# Patient Record
Sex: Female | Born: 1985
Health system: Southern US, Community
[De-identification: ages and names within clinical notes are randomized; demographics above are authoritative.]

## PROBLEM LIST (undated history)

## (undated) ENCOUNTER — Emergency Department (HOSPITAL_COMMUNITY): Admission: EM | Payer: Medicaid Other

## (undated) DIAGNOSIS — F32A Depression, unspecified: Secondary | ICD-10-CM

## (undated) DIAGNOSIS — F29 Unspecified psychosis not due to a substance or known physiological condition: Secondary | ICD-10-CM

## (undated) DIAGNOSIS — F1011 Alcohol abuse, in remission: Secondary | ICD-10-CM

## (undated) DIAGNOSIS — F419 Anxiety disorder, unspecified: Secondary | ICD-10-CM

## (undated) DIAGNOSIS — F1490 Cocaine use, unspecified, uncomplicated: Secondary | ICD-10-CM

## (undated) DIAGNOSIS — F1911 Other psychoactive substance abuse, in remission: Secondary | ICD-10-CM

## (undated) DIAGNOSIS — F319 Bipolar disorder, unspecified: Secondary | ICD-10-CM

## (undated) DIAGNOSIS — F1020 Alcohol dependence, uncomplicated: Secondary | ICD-10-CM

## (undated) DIAGNOSIS — F329 Major depressive disorder, single episode, unspecified: Secondary | ICD-10-CM

## (undated) DIAGNOSIS — J45909 Unspecified asthma, uncomplicated: Secondary | ICD-10-CM

## (undated) HISTORY — DX: Depression, unspecified: F32.A

## (undated) HISTORY — DX: Unspecified psychosis not due to a substance or known physiological condition: F29

## (undated) HISTORY — DX: Anxiety disorder, unspecified: F41.9

## (undated) HISTORY — DX: Bipolar disorder, unspecified: F31.9

## (undated) HISTORY — DX: Alcohol dependence, uncomplicated: F10.20

## (undated) HISTORY — PX: FRACTURE SURGERY: SHX138

## (undated) HISTORY — DX: Alcohol abuse, in remission: F10.11

## (undated) HISTORY — DX: Cocaine use, unspecified, uncomplicated: F14.90

## (undated) HISTORY — DX: Other psychoactive substance abuse, in remission: F19.11

## (undated) NOTE — *Deleted (*Deleted)
    SUBJECTIVE:   CHIEF COMPLAINT / HPI:  Darlene Gates is a 85 year old female who presents today for a physical  Current concerns include:  Healthcare maintenance COVID vaccine: Flu vaccine: Pap smear: Overdue Hepatitis C screening   PERTINENT  PMH / PSH: Bipolar disorder , depression, anxiety, substance abuse, homelessness  OBJECTIVE:   There were no vitals taken for this visit.   General: Alert,no acute distress, pleasant Cardio: Normal S1 and S2, RRR Pulm: CTAB Normal respiratory effort Abdomen: Bowel sounds normal. Abdomen soft and non-tender.  Extremities: No peripheral edema.  Neuro: Cranial nerves grossly intact  ASSESSMENT/PLAN:   No problem-specific Assessment & Plan notes found for this encounter.   Hepatitis C antibody obtained today.  Towanda Octave, MD [facility]
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
## 2016-07-17 DIAGNOSIS — F330 Major depressive disorder, recurrent, mild: Secondary | ICD-10-CM | POA: Insufficient documentation

## 2016-07-17 DIAGNOSIS — F4310 Post-traumatic stress disorder, unspecified: Secondary | ICD-10-CM | POA: Insufficient documentation

## 2016-12-19 DIAGNOSIS — O0990 Supervision of high risk pregnancy, unspecified, unspecified trimester: Secondary | ICD-10-CM | POA: Insufficient documentation

## 2016-12-19 DIAGNOSIS — O352XX0 Maternal care for (suspected) hereditary disease in fetus, not applicable or unspecified: Secondary | ICD-10-CM | POA: Insufficient documentation

## 2016-12-20 DIAGNOSIS — O3430 Maternal care for cervical incompetence, unspecified trimester: Secondary | ICD-10-CM | POA: Insufficient documentation

## 2017-02-25 DIAGNOSIS — O23592 Infection of other part of genital tract in pregnancy, second trimester: Secondary | ICD-10-CM | POA: Insufficient documentation

## 2017-04-04 DIAGNOSIS — F1910 Other psychoactive substance abuse, uncomplicated: Secondary | ICD-10-CM | POA: Insufficient documentation

## 2017-04-04 DIAGNOSIS — F1990 Other psychoactive substance use, unspecified, uncomplicated: Secondary | ICD-10-CM | POA: Insufficient documentation

## 2017-06-02 DIAGNOSIS — B951 Streptococcus, group B, as the cause of diseases classified elsewhere: Secondary | ICD-10-CM | POA: Insufficient documentation

## 2017-06-02 DIAGNOSIS — Z006 Encounter for examination for normal comparison and control in clinical research program: Secondary | ICD-10-CM | POA: Insufficient documentation

## 2017-06-06 DIAGNOSIS — Z309 Encounter for contraceptive management, unspecified: Secondary | ICD-10-CM | POA: Insufficient documentation

## 2017-06-06 DIAGNOSIS — Z98891 History of uterine scar from previous surgery: Secondary | ICD-10-CM | POA: Insufficient documentation

## 2018-04-09 DIAGNOSIS — F6089 Other specific personality disorders: Secondary | ICD-10-CM | POA: Insufficient documentation

## 2018-04-09 DIAGNOSIS — F1020 Alcohol dependence, uncomplicated: Secondary | ICD-10-CM | POA: Insufficient documentation

## 2018-04-09 DIAGNOSIS — F1410 Cocaine abuse, uncomplicated: Secondary | ICD-10-CM | POA: Insufficient documentation

## 2018-04-15 DIAGNOSIS — M545 Low back pain, unspecified: Secondary | ICD-10-CM | POA: Insufficient documentation

## 2018-06-16 DIAGNOSIS — R1084 Generalized abdominal pain: Secondary | ICD-10-CM | POA: Insufficient documentation

## 2018-06-16 DIAGNOSIS — Z6832 Body mass index (BMI) 32.0-32.9, adult: Secondary | ICD-10-CM | POA: Insufficient documentation

## 2018-06-16 DIAGNOSIS — E876 Hypokalemia: Secondary | ICD-10-CM | POA: Insufficient documentation

## 2018-06-16 DIAGNOSIS — Z720 Tobacco use: Secondary | ICD-10-CM | POA: Insufficient documentation

## 2018-10-08 DIAGNOSIS — Z5181 Encounter for therapeutic drug level monitoring: Secondary | ICD-10-CM | POA: Diagnosis not present

## 2018-10-15 DIAGNOSIS — Z0001 Encounter for general adult medical examination with abnormal findings: Secondary | ICD-10-CM | POA: Diagnosis not present

## 2018-10-15 DIAGNOSIS — Z5181 Encounter for therapeutic drug level monitoring: Secondary | ICD-10-CM | POA: Diagnosis not present

## 2018-10-22 DIAGNOSIS — Z5181 Encounter for therapeutic drug level monitoring: Secondary | ICD-10-CM | POA: Diagnosis not present

## 2018-11-06 DIAGNOSIS — F250 Schizoaffective disorder, bipolar type: Secondary | ICD-10-CM | POA: Diagnosis not present

## 2018-11-13 DIAGNOSIS — N912 Amenorrhea, unspecified: Secondary | ICD-10-CM | POA: Diagnosis not present

## 2018-11-19 DIAGNOSIS — F250 Schizoaffective disorder, bipolar type: Secondary | ICD-10-CM | POA: Diagnosis not present

## 2018-12-14 DIAGNOSIS — Z5181 Encounter for therapeutic drug level monitoring: Secondary | ICD-10-CM | POA: Diagnosis not present

## 2018-12-21 DIAGNOSIS — Z7689 Persons encountering health services in other specified circumstances: Secondary | ICD-10-CM | POA: Diagnosis not present

## 2018-12-24 DIAGNOSIS — F331 Major depressive disorder, recurrent, moderate: Secondary | ICD-10-CM | POA: Diagnosis not present

## 2018-12-28 DIAGNOSIS — Z7689 Persons encountering health services in other specified circumstances: Secondary | ICD-10-CM | POA: Diagnosis not present

## 2018-12-30 DIAGNOSIS — Z7689 Persons encountering health services in other specified circumstances: Secondary | ICD-10-CM | POA: Diagnosis not present

## 2018-12-31 DIAGNOSIS — M79671 Pain in right foot: Secondary | ICD-10-CM | POA: Diagnosis not present

## 2018-12-31 DIAGNOSIS — E6609 Other obesity due to excess calories: Secondary | ICD-10-CM | POA: Diagnosis not present

## 2019-01-04 DIAGNOSIS — Z1159 Encounter for screening for other viral diseases: Secondary | ICD-10-CM | POA: Diagnosis not present

## 2019-01-04 DIAGNOSIS — Z7689 Persons encountering health services in other specified circumstances: Secondary | ICD-10-CM | POA: Diagnosis not present

## 2019-01-04 DIAGNOSIS — F331 Major depressive disorder, recurrent, moderate: Secondary | ICD-10-CM | POA: Diagnosis not present

## 2019-01-04 DIAGNOSIS — Z5181 Encounter for therapeutic drug level monitoring: Secondary | ICD-10-CM | POA: Diagnosis not present

## 2019-01-12 DIAGNOSIS — Z1388 Encounter for screening for disorder due to exposure to contaminants: Secondary | ICD-10-CM | POA: Diagnosis not present

## 2019-01-12 DIAGNOSIS — Z0389 Encounter for observation for other suspected diseases and conditions ruled out: Secondary | ICD-10-CM | POA: Diagnosis not present

## 2019-01-12 DIAGNOSIS — Z3009 Encounter for other general counseling and advice on contraception: Secondary | ICD-10-CM | POA: Diagnosis not present

## 2019-01-12 DIAGNOSIS — Z7689 Persons encountering health services in other specified circumstances: Secondary | ICD-10-CM | POA: Diagnosis not present

## 2019-01-12 DIAGNOSIS — Z114 Encounter for screening for human immunodeficiency virus [HIV]: Secondary | ICD-10-CM | POA: Diagnosis not present

## 2019-01-12 DIAGNOSIS — Z1331 Encounter for screening for depression: Secondary | ICD-10-CM | POA: Diagnosis not present

## 2019-01-12 DIAGNOSIS — Z01419 Encounter for gynecological examination (general) (routine) without abnormal findings: Secondary | ICD-10-CM | POA: Diagnosis not present

## 2019-01-12 DIAGNOSIS — Z113 Encounter for screening for infections with a predominantly sexual mode of transmission: Secondary | ICD-10-CM | POA: Diagnosis not present

## 2019-01-18 DIAGNOSIS — Z7689 Persons encountering health services in other specified circumstances: Secondary | ICD-10-CM | POA: Diagnosis not present

## 2019-01-25 DIAGNOSIS — Z7689 Persons encountering health services in other specified circumstances: Secondary | ICD-10-CM | POA: Diagnosis not present

## 2019-01-26 DIAGNOSIS — Z5181 Encounter for therapeutic drug level monitoring: Secondary | ICD-10-CM | POA: Diagnosis not present

## 2019-01-26 DIAGNOSIS — F250 Schizoaffective disorder, bipolar type: Secondary | ICD-10-CM | POA: Diagnosis not present

## 2019-02-01 DIAGNOSIS — Z5181 Encounter for therapeutic drug level monitoring: Secondary | ICD-10-CM | POA: Diagnosis not present

## 2019-02-03 DIAGNOSIS — Z5181 Encounter for therapeutic drug level monitoring: Secondary | ICD-10-CM | POA: Diagnosis not present

## 2019-02-08 DIAGNOSIS — R0602 Shortness of breath: Secondary | ICD-10-CM | POA: Diagnosis not present

## 2019-02-08 DIAGNOSIS — R05 Cough: Secondary | ICD-10-CM | POA: Diagnosis not present

## 2019-02-11 DIAGNOSIS — F331 Major depressive disorder, recurrent, moderate: Secondary | ICD-10-CM | POA: Diagnosis not present

## 2019-02-21 DIAGNOSIS — Z5181 Encounter for therapeutic drug level monitoring: Secondary | ICD-10-CM | POA: Diagnosis not present

## 2019-02-24 DIAGNOSIS — Z5181 Encounter for therapeutic drug level monitoring: Secondary | ICD-10-CM | POA: Diagnosis not present

## 2019-02-25 DIAGNOSIS — R112 Nausea with vomiting, unspecified: Secondary | ICD-10-CM | POA: Diagnosis not present

## 2019-02-25 DIAGNOSIS — Z760 Encounter for issue of repeat prescription: Secondary | ICD-10-CM | POA: Diagnosis not present

## 2019-03-03 DIAGNOSIS — Z5181 Encounter for therapeutic drug level monitoring: Secondary | ICD-10-CM | POA: Diagnosis not present

## 2019-03-04 DIAGNOSIS — F331 Major depressive disorder, recurrent, moderate: Secondary | ICD-10-CM | POA: Diagnosis not present

## 2019-03-04 DIAGNOSIS — Z5181 Encounter for therapeutic drug level monitoring: Secondary | ICD-10-CM | POA: Diagnosis not present

## 2019-03-04 DIAGNOSIS — Z1159 Encounter for screening for other viral diseases: Secondary | ICD-10-CM | POA: Diagnosis not present

## 2019-03-08 DIAGNOSIS — Z5181 Encounter for therapeutic drug level monitoring: Secondary | ICD-10-CM | POA: Diagnosis not present

## 2019-03-09 DIAGNOSIS — Z1159 Encounter for screening for other viral diseases: Secondary | ICD-10-CM | POA: Diagnosis not present

## 2019-04-01 DIAGNOSIS — Z23 Encounter for immunization: Secondary | ICD-10-CM | POA: Diagnosis not present

## 2019-04-08 DIAGNOSIS — Z5181 Encounter for therapeutic drug level monitoring: Secondary | ICD-10-CM | POA: Diagnosis not present

## 2019-04-15 DIAGNOSIS — F259 Schizoaffective disorder, unspecified: Secondary | ICD-10-CM | POA: Diagnosis not present

## 2019-04-15 DIAGNOSIS — Z5181 Encounter for therapeutic drug level monitoring: Secondary | ICD-10-CM | POA: Diagnosis not present

## 2019-04-27 DIAGNOSIS — Z5181 Encounter for therapeutic drug level monitoring: Secondary | ICD-10-CM | POA: Diagnosis not present

## 2019-05-04 DIAGNOSIS — F259 Schizoaffective disorder, unspecified: Secondary | ICD-10-CM | POA: Diagnosis not present

## 2019-05-11 DIAGNOSIS — Z5181 Encounter for therapeutic drug level monitoring: Secondary | ICD-10-CM | POA: Diagnosis not present

## 2019-05-19 DIAGNOSIS — F259 Schizoaffective disorder, unspecified: Secondary | ICD-10-CM | POA: Diagnosis not present

## 2019-05-20 DIAGNOSIS — F259 Schizoaffective disorder, unspecified: Secondary | ICD-10-CM | POA: Diagnosis not present

## 2019-05-26 DIAGNOSIS — F259 Schizoaffective disorder, unspecified: Secondary | ICD-10-CM | POA: Diagnosis not present

## 2019-05-27 DIAGNOSIS — F259 Schizoaffective disorder, unspecified: Secondary | ICD-10-CM | POA: Diagnosis not present

## 2019-05-28 DIAGNOSIS — F259 Schizoaffective disorder, unspecified: Secondary | ICD-10-CM | POA: Diagnosis not present

## 2019-06-09 DIAGNOSIS — F259 Schizoaffective disorder, unspecified: Secondary | ICD-10-CM | POA: Diagnosis not present

## 2019-06-14 DIAGNOSIS — F259 Schizoaffective disorder, unspecified: Secondary | ICD-10-CM | POA: Diagnosis not present

## 2019-06-15 DIAGNOSIS — F250 Schizoaffective disorder, bipolar type: Secondary | ICD-10-CM | POA: Diagnosis not present

## 2019-06-15 DIAGNOSIS — F259 Schizoaffective disorder, unspecified: Secondary | ICD-10-CM | POA: Diagnosis not present

## 2019-06-16 ENCOUNTER — Ambulatory Visit (INDEPENDENT_AMBULATORY_CARE_PROVIDER_SITE_OTHER): Payer: Medicaid Other | Admitting: Family Medicine

## 2019-06-16 ENCOUNTER — Encounter: Payer: Self-pay | Admitting: Family Medicine

## 2019-06-16 ENCOUNTER — Other Ambulatory Visit: Payer: Self-pay

## 2019-06-16 VITALS — BP 122/80 | HR 94 | Ht 62.5 in | Wt 201.0 lb

## 2019-06-16 DIAGNOSIS — Z3200 Encounter for pregnancy test, result unknown: Secondary | ICD-10-CM

## 2019-06-16 DIAGNOSIS — Z7689 Persons encountering health services in other specified circumstances: Secondary | ICD-10-CM | POA: Diagnosis not present

## 2019-06-16 DIAGNOSIS — F259 Schizoaffective disorder, unspecified: Secondary | ICD-10-CM | POA: Diagnosis not present

## 2019-06-16 DIAGNOSIS — Z3009 Encounter for other general counseling and advice on contraception: Secondary | ICD-10-CM

## 2019-06-16 LAB — POCT URINE PREGNANCY: Preg Test, Ur: NEGATIVE

## 2019-06-16 MED ORDER — MEDROXYPROGESTERONE ACETATE 150 MG/ML IM SUSP
150.0000 mg | Freq: Once | INTRAMUSCULAR | Status: AC
  Administered 2019-06-16: 17:00:00 150 mg via INTRAMUSCULAR

## 2019-06-16 NOTE — Patient Instructions (Signed)
Ms Gullickson,  It was lovely to meet you today!! Today we gave you your depo injection. Next time we will do STD screens and pap smear.  I will call you about the naltrexone after I confirm where it can be given.I am not sure we give it at this clinic   Best wishes,  Dr Allena Katz

## 2019-06-17 DIAGNOSIS — F259 Schizoaffective disorder, unspecified: Secondary | ICD-10-CM | POA: Diagnosis not present

## 2019-06-19 DIAGNOSIS — Z3009 Encounter for other general counseling and advice on contraception: Secondary | ICD-10-CM | POA: Insufficient documentation

## 2019-06-19 NOTE — Assessment & Plan Note (Signed)
Patient would like to have Depo injection today. She will come back within the next few months to discuss further birth control options and is considering Nexplanon.  Pregnancy test negative recommended that patient uses condoms for the first week after starting depo.  Counseled patient on side effects of Depo including weight gain, mood disturbance and irregular bleeding.  Patient expressed understanding of this.

## 2019-06-19 NOTE — Progress Notes (Signed)
    SUBJECTIVE:   CHIEF COMPLAINT / HPI:  Darlene Gates is a 34 year old female who present today for a new PCP visit.  Current concerns include: patient would like birth control.  Currently sexually active and does not want to take the contraceptive pill.  LMP 10 February.  Previous are irregular.  Patient reports vomiting a lot recently and thinks she could be pregnant  Past medical history: Pancreatitis, bipolar disorder, PTSD, schizoaffective disorder, depression, asthma, insomnia. Seen by Psychiatrist at [facility] and gets her Psych medications from there. Suicide attempt 2010, 2011 and 2012.  Past surgical history: Right tibia fracture and ankle fracture following fall, C-section 2007  Drug history: NKDA, Vraylar, topiramate, naltrexone, venlafaxine, gabapentin  Family history: Mother died of colon cancer, maternal grandmother had breast cancer.  Strong history of substance abuse and mental health problems .   Social history: Patient currently lives in shelter with 45-year-old son.  Has a 18 year old daughter who she does not have a legal right to see.  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  Smokes half a pack of cigarettes a day for > 20 years.  Has previously done crack, marijuana, cocaine and drank excess alcohol.  Denies this currently.   OBJECTIVE:   BP 122/80   Pulse 94   Ht 5' 2.5" (1.588 m)   Wt 201 lb (91.2 kg)   LMP 05/12/2019   SpO2 97%   BMI 36.18 kg/m   General: Alert, 34 year old AA female, appears to be in no acute distress Cardio: Normal S1 and S2, RRR, No murmurs or rubs.   Pulm: Clear to auscultation bilaterally, no crackles, wheezing, or diminished breath sounds. Normal respiratory effort Abdomen: Bowel sounds normal. Abdomen soft and non-tender.  Extremities: No peripheral edema. Warm/ well perfused.  Strong radial pulese. Neuro: Cranial nerves grossly intact  ASSESSMENT/PLAN:   Birth control counseling Patient would like to have  Depo injection today. She will come back within the next few months to discuss further birth control options and is considering Nexplanon.  Pregnancy test negative recommended that patient uses condoms for the first week after starting depo.  Counseled patient on side effects of Depo including weight gain, mood disturbance and irregular bleeding.  Patient expressed understanding of this.   Towanda Octave, MD [facility]

## 2019-06-21 ENCOUNTER — Ambulatory Visit: Payer: Medicaid Other | Admitting: Family Medicine

## 2019-06-22 DIAGNOSIS — F259 Schizoaffective disorder, unspecified: Secondary | ICD-10-CM | POA: Diagnosis not present

## 2019-06-23 DIAGNOSIS — F259 Schizoaffective disorder, unspecified: Secondary | ICD-10-CM | POA: Diagnosis not present

## 2019-06-28 DIAGNOSIS — F259 Schizoaffective disorder, unspecified: Secondary | ICD-10-CM | POA: Diagnosis not present

## 2019-06-29 DIAGNOSIS — F259 Schizoaffective disorder, unspecified: Secondary | ICD-10-CM | POA: Diagnosis not present

## 2019-07-04 DIAGNOSIS — F259 Schizoaffective disorder, unspecified: Secondary | ICD-10-CM | POA: Diagnosis not present

## 2019-07-05 DIAGNOSIS — F250 Schizoaffective disorder, bipolar type: Secondary | ICD-10-CM | POA: Diagnosis not present

## 2019-07-07 DIAGNOSIS — F250 Schizoaffective disorder, bipolar type: Secondary | ICD-10-CM | POA: Diagnosis not present

## 2019-07-09 DIAGNOSIS — Z5181 Encounter for therapeutic drug level monitoring: Secondary | ICD-10-CM | POA: Diagnosis not present

## 2019-07-09 DIAGNOSIS — F250 Schizoaffective disorder, bipolar type: Secondary | ICD-10-CM | POA: Diagnosis not present

## 2019-07-12 DIAGNOSIS — F250 Schizoaffective disorder, bipolar type: Secondary | ICD-10-CM | POA: Diagnosis not present

## 2019-07-14 DIAGNOSIS — F250 Schizoaffective disorder, bipolar type: Secondary | ICD-10-CM | POA: Diagnosis not present

## 2019-07-15 DIAGNOSIS — F319 Bipolar disorder, unspecified: Secondary | ICD-10-CM | POA: Diagnosis not present

## 2019-07-16 DIAGNOSIS — F250 Schizoaffective disorder, bipolar type: Secondary | ICD-10-CM | POA: Diagnosis not present

## 2019-07-19 DIAGNOSIS — F250 Schizoaffective disorder, bipolar type: Secondary | ICD-10-CM | POA: Diagnosis not present

## 2019-07-20 DIAGNOSIS — Z03818 Encounter for observation for suspected exposure to other biological agents ruled out: Secondary | ICD-10-CM | POA: Diagnosis not present

## 2019-07-21 DIAGNOSIS — F259 Schizoaffective disorder, unspecified: Secondary | ICD-10-CM | POA: Diagnosis not present

## 2019-07-21 DIAGNOSIS — F250 Schizoaffective disorder, bipolar type: Secondary | ICD-10-CM | POA: Diagnosis not present

## 2019-07-21 DIAGNOSIS — Z20828 Contact with and (suspected) exposure to other viral communicable diseases: Secondary | ICD-10-CM | POA: Diagnosis not present

## 2019-07-23 DIAGNOSIS — F250 Schizoaffective disorder, bipolar type: Secondary | ICD-10-CM | POA: Diagnosis not present

## 2019-07-26 DIAGNOSIS — F250 Schizoaffective disorder, bipolar type: Secondary | ICD-10-CM | POA: Diagnosis not present

## 2019-07-29 DIAGNOSIS — F250 Schizoaffective disorder, bipolar type: Secondary | ICD-10-CM | POA: Diagnosis not present

## 2019-07-30 DIAGNOSIS — F250 Schizoaffective disorder, bipolar type: Secondary | ICD-10-CM | POA: Diagnosis not present

## 2019-08-02 DIAGNOSIS — F250 Schizoaffective disorder, bipolar type: Secondary | ICD-10-CM | POA: Diagnosis not present

## 2019-08-04 DIAGNOSIS — Z5181 Encounter for therapeutic drug level monitoring: Secondary | ICD-10-CM | POA: Diagnosis not present

## 2019-08-04 DIAGNOSIS — F250 Schizoaffective disorder, bipolar type: Secondary | ICD-10-CM | POA: Diagnosis not present

## 2019-08-05 ENCOUNTER — Other Ambulatory Visit: Payer: Self-pay | Admitting: Physician Assistant

## 2019-08-05 ENCOUNTER — Other Ambulatory Visit (HOSPITAL_COMMUNITY)
Admission: RE | Admit: 2019-08-05 | Discharge: 2019-08-05 | Disposition: A | Discharge status: Home / Self Care | Payer: Medicaid Other | Source: Ambulatory Visit | Attending: Critical Care Medicine | Admitting: Critical Care Medicine

## 2019-08-05 ENCOUNTER — Ambulatory Visit: Payer: Medicaid Other | Admitting: Physician Assistant

## 2019-08-05 ENCOUNTER — Other Ambulatory Visit: Payer: Self-pay

## 2019-08-05 VITALS — BP 120/89 | HR 79 | Temp 98.2°F | Resp 18 | Ht 64.0 in

## 2019-08-05 DIAGNOSIS — N3000 Acute cystitis without hematuria: Secondary | ICD-10-CM | POA: Diagnosis not present

## 2019-08-05 DIAGNOSIS — F250 Schizoaffective disorder, bipolar type: Secondary | ICD-10-CM | POA: Diagnosis not present

## 2019-08-05 DIAGNOSIS — N760 Acute vaginitis: Secondary | ICD-10-CM

## 2019-08-05 DIAGNOSIS — F1911 Other psychoactive substance abuse, in remission: Secondary | ICD-10-CM | POA: Insufficient documentation

## 2019-08-05 DIAGNOSIS — F32A Depression, unspecified: Secondary | ICD-10-CM | POA: Insufficient documentation

## 2019-08-05 DIAGNOSIS — F329 Major depressive disorder, single episode, unspecified: Secondary | ICD-10-CM | POA: Insufficient documentation

## 2019-08-05 DIAGNOSIS — Z3202 Encounter for pregnancy test, result negative: Secondary | ICD-10-CM

## 2019-08-05 DIAGNOSIS — F411 Generalized anxiety disorder: Secondary | ICD-10-CM | POA: Insufficient documentation

## 2019-08-05 DIAGNOSIS — F319 Bipolar disorder, unspecified: Secondary | ICD-10-CM | POA: Insufficient documentation

## 2019-08-05 DIAGNOSIS — Z113 Encounter for screening for infections with a predominantly sexual mode of transmission: Secondary | ICD-10-CM

## 2019-08-05 LAB — POCT URINALYSIS DIP (CLINITEK)
Bilirubin, UA: NEGATIVE
Glucose, UA: NEGATIVE mg/dL
Ketones, POC UA: NEGATIVE mg/dL
Nitrite, UA: NEGATIVE
POC PROTEIN,UA: NEGATIVE
Spec Grav, UA: 1.025 (ref 1.010–1.025)
Urobilinogen, UA: 0.2 E.U./dL
pH, UA: 7 (ref 5.0–8.0)

## 2019-08-05 LAB — POCT URINE PREGNANCY: Preg Test, Ur: NEGATIVE

## 2019-08-05 MED ORDER — SULFAMETHOXAZOLE-TRIMETHOPRIM 800-160 MG PO TABS: 1.0000 | ORAL_TABLET | Freq: Two times a day (BID) | ORAL | 0 refills | Status: AC

## 2019-08-05 MED ORDER — NAPROXEN 375 MG PO TABS: 375.0000 mg | ORAL_TABLET | Freq: Two times a day (BID) | ORAL | 0 refills | Status: AC

## 2019-08-05 MED ORDER — METRONIDAZOLE 500 MG PO TABS: 500.0000 mg | ORAL_TABLET | Freq: Two times a day (BID) | ORAL | 0 refills | Status: AC

## 2019-08-05 NOTE — Progress Notes (Signed)
New Patient Office Visit  Subjective:  Patient ID: Darlene Gates, female    DOB: 10-Sep-1985  Age: 34 y.o. MRN: [id number]  CC:  Chief Complaint  Patient presents with  . Exposure to STD    HPI  Darlene Gates is a 34 y.o. female who presents for sexually transmitted disease check. Sexual history reviewed with the patient. STI Exposure: sexual contact with individual with uncertain background  and multiple sexual partners:  Previous history of STI HSV. Current symptoms vaginal discharge: scant, white and malodorous, dysuria: moderate, pelvic pain: moderate. Contraception: Depo-Provera injections, first injection was June 21, 2019  Reports suprapubic cramping and discomfort for the past 2 weeks, right CVA tenderness.  Denies fever.   Past Medical History:  Diagnosis Date  . Anxiety   . Bipolar 1 disorder (HCC)   . Depression   . History of alcohol abuse   . History of substance abuse (HCC)     History reviewed. No pertinent surgical history.  Family History  Problem Relation Age of Onset  . Alcohol abuse Mother   . Bipolar disorder Mother   . Depression Mother   . Alcohol abuse Father   . Hyperlipidemia Father   . Hypertension Father   . Alcohol abuse Maternal Grandmother   . Bipolar disorder Maternal Grandmother   . Alcohol abuse Maternal Grandfather     Social History   Socioeconomic History  . Marital status: Married    Spouse name: Not on file  . Number of children: Not on file  . Years of education: Not on file  . Highest education level: Not on file  Occupational History  . Not on file  Tobacco Use  . Smoking status: Current Every Day Smoker    Types: Cigarettes  Substance and Sexual Activity  . Alcohol use: Not on file  . Drug use: Not on file  . Sexual activity: Not on file  Other Topics Concern  . Not on file  Social History Narrative  . Not on file   Social Determinants of Health   Financial Resource Strain:   . Difficulty  of Paying Living Expenses:   Food Insecurity:   . Worried About Programme researcher, broadcasting/film/video in the Last Year:   . Barista in the Last Year:   Transportation Needs:   . Freight forwarder (Medical):   Marland Kitchen Lack of Transportation (Non-Medical):   Physical Activity:   . Days of Exercise per Week:   . Minutes of Exercise per Session:   Stress:   . Feeling of Stress :   Social Connections:   . Frequency of Communication with Friends and Family:   . Frequency of Social Gatherings with Friends and Family:   . Attends Religious Services:   . Active Member of Clubs or Organizations:   . Attends Banker Meetings:   Marland Kitchen Marital Status:   Intimate Partner Violence:   . Fear of Current or Ex-Partner:   . Emotionally Abused:   Marland Kitchen Physically Abused:   . Sexually Abused:     ROS Review of Systems  Constitutional: Negative for chills, fatigue and fever.  HENT: Negative.   Eyes: Negative.   Respiratory: Negative.   Cardiovascular: Negative.   Gastrointestinal: Positive for nausea.  Endocrine: Negative.   Genitourinary: Positive for dysuria, frequency, pelvic pain and vaginal discharge. Negative for flank pain, genital sores and hematuria.  Musculoskeletal: Negative.   Skin: Negative.   Allergic/Immunologic: Negative.  Neurological: Negative.   Hematological: Negative.   Psychiatric/Behavioral: Negative.     Objective:   Today's Vitals: BP 120/89 (BP Location: Left Arm, Patient Position: Sitting, Cuff Size: Large)   Pulse 79   Temp 98.2 F (36.8 C) (Temporal)   Resp 18   Ht 5\' 4"  (1.626 m)   LMP 06/14/2019 Comment: 9 days after depo   SpO2 99%   BMI 34.50 kg/m   Physical Exam Vitals and nursing note reviewed.  Constitutional:      Appearance: Normal appearance. She is obese.  HENT:     Head: Normocephalic and atraumatic.     Right Ear: External ear normal.     Left Ear: External ear normal.     Nose: Nose normal.     Mouth/Throat:     Mouth: Mucous  membranes are moist.     Pharynx: Oropharynx is clear.  Eyes:     Extraocular Movements: Extraocular movements intact.     Conjunctiva/sclera: Conjunctivae normal.     Pupils: Pupils are equal, round, and reactive to light.  Cardiovascular:     Rate and Rhythm: Normal rate and regular rhythm.     Pulses: Normal pulses.     Heart sounds: Normal heart sounds.  Pulmonary:     Effort: Pulmonary effort is normal.     Breath sounds: Normal breath sounds.  Abdominal:     General: Abdomen is flat. Bowel sounds are normal.     Palpations: Abdomen is soft.     Tenderness: There is abdominal tenderness in the suprapubic area. There is right CVA tenderness. There is no left CVA tenderness.  Musculoskeletal:        General: Normal range of motion.     Cervical back: Normal range of motion and neck supple.  Skin:    General: Skin is warm and dry.  Neurological:     General: No focal deficit present.     Mental Status: She is alert and oriented to person, place, and time.  Psychiatric:        Mood and Affect: Mood normal.        Behavior: Behavior normal.        Thought Content: Thought content normal.        Judgment: Judgment normal.     Assessment & Plan:   Problem List Items Addressed This Visit      Other   Bipolar I disorder (HCC)   Depression   Relevant Medications   hydrOXYzine (ATARAX/VISTARIL) 25 MG tablet   busPIRone (BUSPAR) 30 MG tablet   Generalized anxiety disorder   Relevant Medications   hydrOXYzine (ATARAX/VISTARIL) 25 MG tablet   busPIRone (BUSPAR) 30 MG tablet   History of substance abuse ([place])    Other Visit Diagnoses    Acute cystitis without hematuria    -  Primary   Relevant Medications   sulfamethoxazole-trimethoprim (BACTRIM DS) 800-160 MG tablet   naproxen (NAPROSYN) 375 MG tablet   Other Relevant Orders   Urine Culture   Bacterial vaginitis       Relevant Medications   acyclovir (ZOVIRAX) 800 MG tablet   sulfamethoxazole-trimethoprim (BACTRIM  DS) 800-160 MG tablet   metroNIDAZOLE (FLAGYL) 500 MG tablet   Other Relevant Orders   Cervicovaginal ancillary only   Routine screening for STI (sexually transmitted infection)       Relevant Orders   RPR   HIV antibody (with reflex)   Urine cytology ancillary only   HSV Type I/II IgG, IgMw/ reflex  Cervicovaginal ancillary only   Negative pregnancy test       Relevant Orders   POCT urine pregnancy (Completed)      Outpatient Encounter Medications as of 08/05/2019  Medication Sig  . acyclovir (ZOVIRAX) 800 MG tablet TK 1 T PO BID FOR 5 DAYS  . albuterol (VENTOLIN HFA) 108 (90 Base) MCG/ACT inhaler INHALE 2 PUFFS ONLY AS NEEDED FOR SHORTNESS OR BREATH OR WHEEZING  . ARIPiprazole (ABILIFY) 20 MG tablet Take 20 mg by mouth daily.  . busPIRone (BUSPAR) 30 MG tablet Take 30 mg by mouth 2 (two) times daily.  . cetirizine (ZYRTEC) 10 MG tablet TK 1/2 T PO QAM  . EPINEPHrine 0.3 mg/0.3 mL IJ SOAJ injection INJECT INTRAMUSCULARLY AS DIRECTED FOR PEANUT EXPOSURE.  SEEK EMS ASSISTANCE ASAP  . gabapentin (NEURONTIN) 300 MG capsule Take 300 mg by mouth 3 (three) times daily.  . hydrOXYzine (ATARAX/VISTARIL) 25 MG tablet Take 25 mg by mouth 3 (three) times daily as needed.  . naproxen (NAPROSYN) 375 MG tablet Take 1 tablet (375 mg total) by mouth 2 (two) times daily with a meal.  . topiramate (TOPAMAX) 25 MG tablet Take 25 mg by mouth 2 (two) times daily.  . [DISCONTINUED] naproxen (NAPROSYN) 375 MG tablet Take 375 mg by mouth 2 (two) times daily with a meal.  . metroNIDAZOLE (FLAGYL) 500 MG tablet Take 1 tablet (500 mg total) by mouth 2 (two) times daily for 7 days.  Marland Kitchen sulfamethoxazole-trimethoprim (BACTRIM DS) 800-160 MG tablet Take 1 tablet by mouth 2 (two) times daily for 7 days.   No facility-administered encounter medications on file as of 08/05/2019.  1. Acute cystitis without hematuria Urinalysis was positive for leukocytes and small blood, will treat for urinary tract infection as well as  bacterial vaginosis.  Patient was given supply of condoms, patient education given on prevention of STIs.  Patient reported history of herpes, will test and prescribe medication for outbreaks if appropriate  Sulfamethoxazole-trimethoprim (BACTRIM DS) 800-160 MG tablet; Take 1 tablet by mouth 2 (two) times daily for 7 days.  Dispense: 14 tablet; Refill: 0 - Urine Culture - naproxen (NAPROSYN) 375 MG tablet; Take 1 tablet (375 mg total) by mouth 2 (two) times daily with a meal.  Dispense: 60 tablet; Refill: 0 - POCT URINALYSIS DIP (CLINITEK)  2. Bacterial vaginitis  - Cervicovaginal ancillary only  3. Routine screening for STI (sexually transmitted infection)  - RPR - HIV antibody (with reflex) - Urine cytology ancillary only - HSV Type I/II IgG, IgMw/ reflex - Cervicovaginal ancillary only  4. Negative pregnancy test  - POCT urine pregnancy   I have reviewed the patient's medical history (PMH, PSH, Social History, Family History, Medications, and allergies) , and have been updated if relevant. I spent 30 minutes reviewing chart and  face to face time with patient.     Follow-up: Return if symptoms worsen or fail to improve.   Kasandra Knudsen Mayers, PA-C

## 2019-08-05 NOTE — Patient Instructions (Addendum)
You are being treated for a urinary tract infection and bacterial vaginosis, you will take Bactrim twice a day for 7 days and Metronidazole  3 times a day for 7 days .  I also sent naproxen to your pharmacy to help you with abdominal pain, I encourage you to increase water intake at this time.  We will contact you with your lab results.  I hope that you feel better soon, please feel free to follow-up at the mobile unit as needed  Kennieth Rad, PA-C Physician Assistant [facility] [email or web address]    Urinary Tract Infection, Adult  A urinary tract infection (UTI) is an infection of any part of the urinary tract. The urinary tract includes the kidneys, ureters, bladder, and urethra. These organs make, store, and get rid of urine in the body. Your health care provider may use other names to describe the infection. An upper UTI affects the ureters and kidneys (pyelonephritis). A lower UTI affects the bladder (cystitis) and urethra (urethritis). What are the causes? Most urinary tract infections are caused by bacteria in your genital area, around the entrance to your urinary tract (urethra). These bacteria grow and cause inflammation of your urinary tract. What increases the risk? You are more likely to develop this condition if:  You have a urinary catheter that stays in place (indwelling).  You are not able to control when you urinate or have a bowel movement (you have incontinence).  You are female and you: ? Use a spermicide or diaphragm for birth control. ? Have low estrogen levels. ? Are pregnant.  You have certain genes that increase your risk (genetics).  You are sexually active.  You take antibiotic medicines.  You have a condition that causes your flow of urine to slow down, such as: ? An enlarged prostate, if you are female. ? Blockage in your urethra (stricture). ? A kidney stone. ? A nerve condition that  affects your bladder control (neurogenic bladder). ? Not getting enough to drink, or not urinating often.  You have certain medical conditions, such as: ? Diabetes. ? A weak disease-fighting system (immunesystem). ? Sickle cell disease. ? Gout. ? Spinal cord injury. What are the signs or symptoms? Symptoms of this condition include:  Needing to urinate right away (urgently).  Frequent urination or passing small amounts of urine frequently.  Pain or burning with urination.  Blood in the urine.  Urine that smells bad or unusual.  Trouble urinating.  Cloudy urine.  Vaginal discharge, if you are female.  Pain in the abdomen or the lower back. You may also have:  Vomiting or a decreased appetite.  Confusion.  Irritability or tiredness.  A fever.  Diarrhea. The first symptom in older adults may be confusion. In some cases, they may not have any symptoms until the infection has worsened. How is this diagnosed? This condition is diagnosed based on your medical history and a physical exam. You may also have other tests, including:  Urine tests.  Blood tests.  Tests for sexually transmitted infections (STIs). If you have had more than one UTI, a cystoscopy or imaging studies may be done to determine the cause of the infections. How is this treated? Treatment for this condition includes:  Antibiotic medicine.  Over-the-counter medicines to treat discomfort.  Drinking enough water to stay hydrated. If you have frequent infections or have other conditions such as a kidney stone, you may need to see a health care provider who specializes in the urinary tract (  urologist). In rare cases, urinary tract infections can cause sepsis. Sepsis is a life-threatening condition that occurs when the body responds to an infection. Sepsis is treated in the hospital with IV antibiotics, fluids, and other medicines. Follow these instructions at home:  Medicines  Take over-the-counter  and prescription medicines only as told by your health care provider.  If you were prescribed an antibiotic medicine, take it as told by your health care provider. Do not stop using the antibiotic even if you start to feel better. General instructions  Make sure you: ? Empty your bladder often and completely. Do not hold urine for long periods of time. ? Empty your bladder after sex. ? Wipe from front to back after a bowel movement if you are female. Use each tissue one time when you wipe.  Drink enough fluid to keep your urine pale yellow.  Keep all follow-up visits as told by your health care provider. This is important. Contact a health care provider if:  Your symptoms do not get better after 1-2 days.  Your symptoms go away and then return. Get help right away if you have:  Severe pain in your back or your lower abdomen.  A fever.  Nausea or vomiting. Summary  A urinary tract infection (UTI) is an infection of any part of the urinary tract, which includes the kidneys, ureters, bladder, and urethra.  Most urinary tract infections are caused by bacteria in your genital area, around the entrance to your urinary tract (urethra).  Treatment for this condition often includes antibiotic medicines.  If you were prescribed an antibiotic medicine, take it as told by your health care provider. Do not stop using the antibiotic even if you start to feel better.  Keep all follow-up visits as told by your health care provider. This is important. This information is not intended to replace advice given to you by your health care provider. Make sure you discuss any questions you have with your health care provider. Document Revised: 03/05/2018 Document Reviewed: 09/25/2017 Elsevier Patient Education  2020 Elsevier Inc.   Bacterial Vaginosis  Bacterial vaginosis is a vaginal infection that occurs when the normal balance of bacteria in the vagina is disrupted. It results from an  overgrowth of certain bacteria. This is the most common vaginal infection among women ages 23-44. Because bacterial vaginosis increases your risk for STIs (sexually transmitted infections), getting treated can help reduce your risk for chlamydia, gonorrhea, herpes, and HIV (human immunodeficiency virus). Treatment is also important for preventing complications in pregnant women, because this condition can cause an early (premature) delivery. What are the causes? This condition is caused by an increase in harmful bacteria that are normally present in small amounts in the vagina. However, the reason that the condition develops is not fully understood. What increases the risk? The following factors may make you more likely to develop this condition:  Having a new sexual partner or multiple sexual partners.  Having unprotected sex.  Douching.  Having an intrauterine device (IUD).  Smoking.  Drug and alcohol abuse.  Taking certain antibiotic medicines.  Being pregnant. You cannot get bacterial vaginosis from toilet seats, bedding, swimming pools, or contact with objects around you. What are the signs or symptoms? Symptoms of this condition include:  Grey or white vaginal discharge. The discharge can also be watery or foamy.  A fish-like odor with discharge, especially after sexual intercourse or during menstruation.  Itching in and around the vagina.  Burning or pain with urination.  Some women with bacterial vaginosis have no signs or symptoms. How is this diagnosed? This condition is diagnosed based on:  Your medical history.  A physical exam of the vagina.  Testing a sample of vaginal fluid under a microscope to look for a large amount of bad bacteria or abnormal cells. Your health care provider may use a cotton swab or a small wooden spatula to collect the sample. How is this treated? This condition is treated with antibiotics. These may be given as a pill, a vaginal cream,  or a medicine that is put into the vagina (suppository). If the condition comes back after treatment, a second round of antibiotics may be needed. Follow these instructions at home: Medicines  Take over-the-counter and prescription medicines only as told by your health care provider.  Take or use your antibiotic as told by your health care provider. Do not stop taking or using the antibiotic even if you start to feel better. General instructions  If you have a female sexual partner, tell her that you have a vaginal infection. She should see her health care provider and be treated if she has symptoms. If you have a female sexual partner, he does not need treatment.  During treatment: ? Avoid sexual activity until you finish treatment. ? Do not douche. ? Avoid alcohol as directed by your health care provider. ? Avoid breastfeeding as directed by your health care provider.  Drink enough water and fluids to keep your urine clear or pale yellow.  Keep the area around your vagina and rectum clean. ? Wash the area daily with warm water. ? Wipe yourself from front to back after using the toilet.  Keep all follow-up visits as told by your health care provider. This is important. How is this prevented?  Do not douche.  Wash the outside of your vagina with warm water only.  Use protection when having sex. This includes latex condoms and dental dams.  Limit how many sexual partners you have. To help prevent bacterial vaginosis, it is best to have sex with just one partner (monogamous).  Make sure you and your sexual partner are tested for STIs.  Wear cotton or cotton-lined underwear.  Avoid wearing tight pants and pantyhose, especially during summer.  Limit the amount of alcohol that you drink.  Do not use any products that contain nicotine or tobacco, such as cigarettes and e-cigarettes. If you need help quitting, ask your health care provider.  Do not use illegal drugs. Where to find  more information  Centers for Disease Control and Prevention: SolutionApps.co.za  American Sexual Health Association (ASHA): www.ashastd.org  U.S. Department of Health and Health and safety inspector, Office on Women's Health: ConventionalMedicines.si or http://www.anderson-williamson.info/ Contact a health care provider if:  Your symptoms do not improve, even after treatment.  You have more discharge or pain when urinating.  You have a fever.  You have pain in your abdomen.  You have pain during sex.  You have vaginal bleeding between periods. Summary  Bacterial vaginosis is a vaginal infection that occurs when the normal balance of bacteria in the vagina is disrupted.  Because bacterial vaginosis increases your risk for STIs (sexually transmitted infections), getting treated can help reduce your risk for chlamydia, gonorrhea, herpes, and HIV (human immunodeficiency virus). Treatment is also important for preventing complications in pregnant women, because the condition can cause an early (premature) delivery.  This condition is treated with antibiotic medicines. These may be given as a pill, a vaginal cream, or a  medicine that is put into the vagina (suppository). This information is not intended to replace advice given to you by your health care provider. Make sure you discuss any questions you have with your health care provider. Document Revised: 02/28/2017 Document Reviewed: 12/02/2015 Elsevier Patient Education  2020 [employer].

## 2019-08-06 DIAGNOSIS — Z03818 Encounter for observation for suspected exposure to other biological agents ruled out: Secondary | ICD-10-CM | POA: Diagnosis not present

## 2019-08-06 DIAGNOSIS — F250 Schizoaffective disorder, bipolar type: Secondary | ICD-10-CM | POA: Diagnosis not present

## 2019-08-07 LAB — URINE CULTURE

## 2019-08-09 DIAGNOSIS — F250 Schizoaffective disorder, bipolar type: Secondary | ICD-10-CM | POA: Diagnosis not present

## 2019-08-09 LAB — HSV TYPE I/II IGG, IGMW/ REFLEX
HSV 1 Glycoprotein G Ab, IgG: 46.5 index — ABNORMAL HIGH (ref 0.00–0.90)
HSV 1 IgM: <1:10 {titer}
HSV 2 IgG, Type Spec: 6.78 index — ABNORMAL HIGH (ref 0.00–0.90)
HSV 2 IgM: <1:10 {titer}

## 2019-08-09 LAB — RPR: RPR Ser Ql: NONREACTIVE

## 2019-08-09 LAB — HIV ANTIBODY (ROUTINE TESTING W REFLEX): HIV Screen 4th Generation wRfx: NONREACTIVE

## 2019-08-10 LAB — SPECIMEN STATUS REPORT

## 2019-08-11 ENCOUNTER — Telehealth: Payer: Self-pay | Admitting: *Deleted

## 2019-08-11 ENCOUNTER — Other Ambulatory Visit: Payer: Self-pay | Admitting: Physician Assistant

## 2019-08-11 DIAGNOSIS — A6000 Herpesviral infection of urogenital system, unspecified: Secondary | ICD-10-CM

## 2019-08-11 DIAGNOSIS — F250 Schizoaffective disorder, bipolar type: Secondary | ICD-10-CM | POA: Diagnosis not present

## 2019-08-11 LAB — CERVICOVAGINAL ANCILLARY ONLY
Bacterial Vaginitis (gardnerella): POSITIVE — AB
Candida Glabrata: NEGATIVE
Candida Vaginitis: NEGATIVE
Chlamydia: NEGATIVE
Comment: NEGATIVE
Comment: NEGATIVE
Comment: NEGATIVE
Comment: NEGATIVE
Comment: NEGATIVE
Comment: NORMAL
Neisseria Gonorrhea: NEGATIVE
Trichomonas: POSITIVE — AB

## 2019-08-11 MED ORDER — ACYCLOVIR 400 MG PO TABS: 400.0000 mg | ORAL_TABLET | Freq: Three times a day (TID) | ORAL | 0 refills | Status: DC

## 2019-08-12 DIAGNOSIS — F250 Schizoaffective disorder, bipolar type: Secondary | ICD-10-CM | POA: Diagnosis not present

## 2019-08-13 DIAGNOSIS — F250 Schizoaffective disorder, bipolar type: Secondary | ICD-10-CM | POA: Diagnosis not present

## 2019-08-17 DIAGNOSIS — Z5181 Encounter for therapeutic drug level monitoring: Secondary | ICD-10-CM | POA: Diagnosis not present

## 2019-08-17 NOTE — Telephone Encounter (Signed)
Patient verified DOB Patient is aware of labs showing positive for HSG I/II, Trichomonas being present and needing to complete the treatment.

## 2019-08-18 DIAGNOSIS — F250 Schizoaffective disorder, bipolar type: Secondary | ICD-10-CM | POA: Diagnosis not present

## 2019-08-19 ENCOUNTER — Other Ambulatory Visit: Payer: Self-pay

## 2019-08-19 ENCOUNTER — Encounter: Payer: Self-pay | Admitting: Physician Assistant

## 2019-08-19 ENCOUNTER — Ambulatory Visit: Payer: Medicaid Other | Admitting: Physician Assistant

## 2019-08-19 DIAGNOSIS — Z5181 Encounter for therapeutic drug level monitoring: Secondary | ICD-10-CM | POA: Diagnosis not present

## 2019-08-19 DIAGNOSIS — F319 Bipolar disorder, unspecified: Secondary | ICD-10-CM | POA: Diagnosis not present

## 2019-08-19 DIAGNOSIS — N760 Acute vaginitis: Secondary | ICD-10-CM

## 2019-08-19 DIAGNOSIS — B9689 Other specified bacterial agents as the cause of diseases classified elsewhere: Secondary | ICD-10-CM

## 2019-08-23 DIAGNOSIS — Z03818 Encounter for observation for suspected exposure to other biological agents ruled out: Secondary | ICD-10-CM | POA: Diagnosis not present

## 2019-08-24 DIAGNOSIS — F250 Schizoaffective disorder, bipolar type: Secondary | ICD-10-CM | POA: Diagnosis not present

## 2019-08-24 NOTE — Progress Notes (Signed)
Will you check on this please?  Thanks!

## 2019-08-24 NOTE — Progress Notes (Signed)
Patient has received all lab testing results and treatment.

## 2019-08-25 DIAGNOSIS — Z03818 Encounter for observation for suspected exposure to other biological agents ruled out: Secondary | ICD-10-CM | POA: Diagnosis not present

## 2019-08-25 DIAGNOSIS — F250 Schizoaffective disorder, bipolar type: Secondary | ICD-10-CM | POA: Diagnosis not present

## 2019-08-27 DIAGNOSIS — F250 Schizoaffective disorder, bipolar type: Secondary | ICD-10-CM | POA: Diagnosis not present

## 2019-08-31 DIAGNOSIS — Z03818 Encounter for observation for suspected exposure to other biological agents ruled out: Secondary | ICD-10-CM | POA: Diagnosis not present

## 2019-08-31 DIAGNOSIS — F250 Schizoaffective disorder, bipolar type: Secondary | ICD-10-CM | POA: Diagnosis not present

## 2019-09-02 DIAGNOSIS — F250 Schizoaffective disorder, bipolar type: Secondary | ICD-10-CM | POA: Diagnosis not present

## 2019-09-03 DIAGNOSIS — F250 Schizoaffective disorder, bipolar type: Secondary | ICD-10-CM | POA: Diagnosis not present

## 2019-09-06 DIAGNOSIS — F250 Schizoaffective disorder, bipolar type: Secondary | ICD-10-CM | POA: Diagnosis not present

## 2019-09-08 DIAGNOSIS — Z03818 Encounter for observation for suspected exposure to other biological agents ruled out: Secondary | ICD-10-CM | POA: Diagnosis not present

## 2019-09-09 DIAGNOSIS — F250 Schizoaffective disorder, bipolar type: Secondary | ICD-10-CM | POA: Diagnosis not present

## 2019-09-10 DIAGNOSIS — Z03818 Encounter for observation for suspected exposure to other biological agents ruled out: Secondary | ICD-10-CM | POA: Diagnosis not present

## 2019-09-10 DIAGNOSIS — F250 Schizoaffective disorder, bipolar type: Secondary | ICD-10-CM | POA: Diagnosis not present

## 2019-09-14 ENCOUNTER — Encounter (HOSPITAL_COMMUNITY): Payer: Self-pay

## 2019-09-14 ENCOUNTER — Other Ambulatory Visit: Payer: Self-pay

## 2019-09-14 ENCOUNTER — Emergency Department (HOSPITAL_COMMUNITY)
Admission: EM | Admit: 2019-09-14 | Discharge: 2019-09-15 | Disposition: A | Discharge status: Home / Self Care | Payer: Medicaid Other | Attending: Emergency Medicine | Admitting: Emergency Medicine

## 2019-09-14 DIAGNOSIS — Y906 Blood alcohol level of 120-199 mg/100 ml: Secondary | ICD-10-CM | POA: Insufficient documentation

## 2019-09-14 DIAGNOSIS — F4312 Post-traumatic stress disorder, chronic: Secondary | ICD-10-CM | POA: Diagnosis not present

## 2019-09-14 DIAGNOSIS — R45851 Suicidal ideations: Secondary | ICD-10-CM | POA: Insufficient documentation

## 2019-09-14 DIAGNOSIS — Z03818 Encounter for observation for suspected exposure to other biological agents ruled out: Secondary | ICD-10-CM | POA: Diagnosis not present

## 2019-09-14 DIAGNOSIS — F17210 Nicotine dependence, cigarettes, uncomplicated: Secondary | ICD-10-CM | POA: Insufficient documentation

## 2019-09-14 DIAGNOSIS — F250 Schizoaffective disorder, bipolar type: Secondary | ICD-10-CM | POA: Diagnosis not present

## 2019-09-14 DIAGNOSIS — F1010 Alcohol abuse, uncomplicated: Secondary | ICD-10-CM | POA: Diagnosis not present

## 2019-09-14 LAB — COMPREHENSIVE METABOLIC PANEL
ALT: 44 U/L (ref 0–44)
AST: 29 U/L (ref 15–41)
Albumin: 4.3 g/dL (ref 3.5–5.0)
Alkaline Phosphatase: 65 U/L (ref 38–126)
Anion gap: 10 (ref 5–15)
BUN: 12 mg/dL (ref 6–20)
CO2: 20 mmol/L — ABNORMAL LOW (ref 22–32)
Calcium: 8.7 mg/dL — ABNORMAL LOW (ref 8.9–10.3)
Chloride: 114 mmol/L — ABNORMAL HIGH (ref 98–111)
Creatinine, Ser: 0.78 mg/dL (ref 0.44–1.00)
GFR calc Af Amer: >60 mL/min (ref >60)
GFR calc non Af Amer: >60 mL/min (ref >60)
Glucose, Bld: 91 mg/dL (ref 70–99)
Potassium: 3.6 mmol/L (ref 3.5–5.1)
Sodium: 144 mmol/L (ref 135–145)
Total Bilirubin: 0.7 mg/dL (ref 0.3–1.2)
Total Protein: 7.2 g/dL (ref 6.5–8.1)

## 2019-09-14 LAB — RAPID URINE DRUG SCREEN, HOSP PERFORMED
Amphetamines: NOT DETECTED
Barbiturates: NOT DETECTED
Benzodiazepines: NOT DETECTED
Cocaine: NOT DETECTED
Opiates: NOT DETECTED
Tetrahydrocannabinol: NOT DETECTED

## 2019-09-14 LAB — ACETAMINOPHEN LEVEL: Acetaminophen (Tylenol), Serum: <10 ug/mL — ABNORMAL LOW (ref 10–30)

## 2019-09-14 LAB — SALICYLATE LEVEL: Salicylate Lvl: <7 mg/dL — ABNORMAL LOW (ref 7.0–30.0)

## 2019-09-14 LAB — CBC
HCT: 35.9 % — ABNORMAL LOW (ref 36.0–46.0)
Hemoglobin: 12.2 g/dL (ref 12.0–15.0)
MCH: 32.4 pg (ref 26.0–34.0)
MCHC: 34 g/dL (ref 30.0–36.0)
MCV: 95.2 fL (ref 80.0–100.0)
Platelets: 272 10*3/uL (ref 150–400)
RBC: 3.77 MIL/uL — ABNORMAL LOW (ref 3.87–5.11)
RDW: 13.7 % (ref 11.5–15.5)
WBC: 7.7 10*3/uL (ref 4.0–10.5)
nRBC: 0 % (ref 0.0–0.2)

## 2019-09-14 LAB — I-STAT BETA HCG BLOOD, ED (MC, WL, AP ONLY): I-stat hCG, quantitative: <5 m[IU]/mL (ref <5)

## 2019-09-14 LAB — ETHANOL: Alcohol, Ethyl (B): 142 mg/dL — ABNORMAL HIGH (ref <10)

## 2019-09-14 MED ORDER — HALOPERIDOL 5 MG PO TABS
5.0000 mg | ORAL_TABLET | Freq: Once | ORAL | Status: AC
  Administered 2019-09-14: 5 mg via ORAL
  Filled 2019-09-14: qty 1

## 2019-09-14 NOTE — ED Notes (Signed)
Security called to bedside due to patient taking her belongings and attempting to leave the department.

## 2019-09-14 NOTE — ED Triage Notes (Signed)
Pt here voluntarily and states that she was going to jump in front of a train Pt's last drink was about one hour ago, pt was at [place] and disclosed her SI plan and they contacted police

## 2019-09-14 NOTE — ED Provider Notes (Addendum)
[facility] DEPT Provider Note   CSN: [id number] Arrival date & time: 09/14/19  2037     History Chief Complaint  Patient presents with  . Suicidal    Darlene Gates is a 34 y.o. female.  The history is provided by the patient.  Mental Health Problem Presenting symptoms: suicidal thoughts and suicidal threats   Degree of incapacity (severity):  Moderate Onset quality:  Sudden Timing:  Constant Progression:  Unchanged Chronicity:  Recurrent Context: alcohol use   Treatment compliance:  Untreated Worsened by:  Nothing Associated symptoms: no abdominal pain and no chest pain   Risk factors: hx of mental illness        Past Medical History:  Diagnosis Date  . Anxiety   . Bipolar 1 disorder ([place])   . Depression   . History of alcohol abuse   . History of substance abuse [facility])     Patient Active Problem List   Diagnosis Date Noted  . Bipolar I disorder ([place]) 08/05/2019  . Depression 08/05/2019  . Generalized anxiety disorder 08/05/2019  . History of substance abuse ([place]) 08/05/2019  . Birth control counseling 06/19/2019    History reviewed. No pertinent surgical history.   OB History   No obstetric history on file.     Family History  Problem Relation Age of Onset  . Alcohol abuse Mother   . Bipolar disorder Mother   . Depression Mother   . Alcohol abuse Father   . Hyperlipidemia Father   . Hypertension Father   . Alcohol abuse Maternal Grandmother   . Bipolar disorder Maternal Grandmother   . Alcohol abuse Maternal Grandfather     Social History   Tobacco Use  . Smoking status: Current Every Day Smoker    Types: Cigarettes  . Smokeless tobacco: Never Used  Substance Use Topics  . Alcohol use: Not Currently  . Drug use: Not Currently    Home Medications Prior to Admission medications   Medication Sig Start Date End Date Taking? Authorizing Provider  acyclovir (ZOVIRAX) 800 MG tablet TK 1 T PO BID FOR 5  DAYS 10/08/18   [provider]  albuterol (VENTOLIN HFA) 108 (90 Base) MCG/ACT inhaler INHALE 2 PUFFS ONLY AS NEEDED FOR SHORTNESS OR BREATH OR WHEEZING 06/26/18   [provider]  ARIPiprazole (ABILIFY) 20 MG tablet Take 20 mg by mouth daily.    [provider]  busPIRone (BUSPAR) 30 MG tablet Take 30 mg by mouth 2 (two) times daily.    [provider]  cetirizine (ZYRTEC) 10 MG tablet TK 1/2 T PO QAM 10/15/18   [provider]  EPINEPHrine 0.3 mg/0.3 mL IJ SOAJ injection INJECT INTRAMUSCULARLY AS DIRECTED FOR PEANUT EXPOSURE.  SEEK EMS ASSISTANCE ASAP 10/15/18   [provider]  gabapentin (NEURONTIN) 300 MG capsule Take 300 mg by mouth 3 (three) times daily.    [provider]  hydrOXYzine (ATARAX/VISTARIL) 25 MG tablet Take 25 mg by mouth 3 (three) times daily as needed.    [provider]  topiramate (TOPAMAX) 25 MG tablet Take 25 mg by mouth 2 (two) times daily.    [provider]    Allergies    Bee pollen, Chlorpromazine, Other, Oxycodone-acetaminophen, Peanut oil, Pineapple, and Promethazine  Review of Systems   Review of Systems  Constitutional: Negative for chills and fever.  HENT: Negative for ear pain and sore throat.   Eyes: Negative for pain and visual disturbance.  Respiratory: Negative for cough  and shortness of breath.   Cardiovascular: Negative for chest pain and palpitations.  Gastrointestinal: Negative for abdominal pain and vomiting.  Genitourinary: Negative for dysuria and hematuria.  Musculoskeletal: Negative for arthralgias and back pain.  Skin: Negative for color change and rash.  Neurological: Negative for seizures and syncope.  Psychiatric/Behavioral: Positive for suicidal ideas.  All other systems reviewed and are negative.   Physical Exam Updated Vital Signs  ED Triage Vitals  Enc Vitals Group     BP 09/14/19 2050 136/82     Pulse Rate 09/14/19 2050 (!) 106     Resp 09/14/19  2050 20     Temp 09/14/19 2050 98.9 F (37.2 C)     Temp Source 09/14/19 2050 Oral     SpO2 09/14/19 2050 97 %     Weight 09/14/19 2050 210 lb (95.3 kg)     Height 09/14/19 2050 5\' 4"  (1.626 m)     Head Circumference --      Peak Flow --      Pain Score 09/14/19 2047 0     Pain Loc --      Pain Edu? --      Excl. in GC? --     Physical Exam Vitals and nursing note reviewed.  Constitutional:      General: She is not in acute distress.    Appearance: She is well-developed.  HENT:     Head: Atraumatic.  Eyes:     Conjunctiva/sclera: Conjunctivae normal.  Cardiovascular:     Rate and Rhythm: Normal rate and regular rhythm.     Pulses: Normal pulses.     Heart sounds: Normal heart sounds. No murmur heard.   Pulmonary:     Effort: Pulmonary effort is normal. No respiratory distress.     Breath sounds: Normal breath sounds.  Abdominal:     Palpations: Abdomen is soft.     Tenderness: There is no abdominal tenderness.  Musculoskeletal:     Cervical back: Neck supple.  Skin:    General: Skin is warm and dry.  Neurological:     General: No focal deficit present.     Mental Status: She is alert and oriented to person, place, and time.     Cranial Nerves: Cranial nerves are intact. No cranial nerve deficit.     Sensory: No sensory deficit.     Motor: No weakness.  Psychiatric:        Mood and Affect: Affect is labile.        Thought Content: Thought content includes suicidal ideation. Thought content does not include homicidal ideation. Thought content does not include homicidal or suicidal plan.        Judgment: Judgment is impulsive.     ED Results / Procedures / Treatments   Labs (all labs ordered are listed, but only abnormal results are displayed) Labs Reviewed  COMPREHENSIVE METABOLIC PANEL - Abnormal; Notable for the following components:      Result Value   Chloride 114 (*)    CO2 20 (*)    Calcium 8.7 (*)    All other components within normal limits  ETHANOL  - Abnormal; Notable for the following components:   Alcohol, Ethyl (B) 142 (*)    All other components within normal limits  SALICYLATE LEVEL - Abnormal; Notable for the following components:   Salicylate Lvl <7.0 (*)    All other components within normal limits  ACETAMINOPHEN LEVEL - Abnormal; Notable for the following components:   Acetaminophen (  Tylenol), Serum <10 (*)    All other components within normal limits  CBC - Abnormal; Notable for the following components:   RBC 3.77 (*)    HCT 35.9 (*)    All other components within normal limits  RAPID URINE DRUG SCREEN, HOSP PERFORMED  I-STAT BETA HCG BLOOD, ED ([facility], [facility], [facility] ONLY)    EKG None  Radiology No results found.  Procedures Procedures (including critical care time)  Medications Ordered in ED Medications  haloperidol (HALDOL) tablet 5 mg (5 mg Oral Given 09/14/19 2236)    ED Course  I have reviewed the triage vital signs and the nursing notes.  Pertinent labs & imaging results that were available during my care of the patient were reviewed by me and considered in my medical decision making (see chart for details).    MDM Rules/Calculators/A&P                          Debralee Braaksma is a 34 year old female with history of schizophrenia who presents the ED with suicidal ideation.  Patient with normal vitals.  No fever.  Appears intoxicated.  Patient appears manic.  Aggressive.  States that she is homeless.  Not taking any of her medications.  Lab work showed no significant anemia, electrolyte abnormality.  Alcohol level is elevated.  Patient states that she is in a tough place has nowhere to live.  At this time she was able to be redirected and given Haldol to relax.  Patient needs to stay for psychiatric evaluation and social work.  Currently voluntary at this time.  However if she does attempt to leave I have filled out IVC paperwork to be filed if needed.  Medically cleared.  Awaiting psychiatric evaluation.  This  chart was dictated using voice recognition software.  Despite best efforts to proofread,  errors can occur which can change the documentation meaning.    Final Clinical Impression(s) / ED Diagnoses Final diagnoses:  Suicidal ideation  Alcohol abuse    Rx / DC Orders ED Discharge Orders    None       Virgina Norfolk, DO 09/15/19 0124    Virgina Norfolk, DO 09/15/19 0124

## 2019-09-14 NOTE — ED Notes (Signed)
Pt sts drinking 2 16oz busch ice and half a 40oz icehouse pta.

## 2019-09-14 NOTE — ED Notes (Addendum)
Patient makes attempt to walk out the EMS bay doors. Patient advised she cannot go outside right now. Patient states " I want to go out for some air." Patient made aware right now that is not possible. Patient then asks about the restroom and is directed towards same. Patient then goes back to the hall bed and grabs her purse and puts her shoes on. Patient then states " I just want to go out and get some air."  Patient then walks around towards section 9-12 nurses station to attempt to find another exit. Staff escorted patient back to hallway D.

## 2019-09-15 ENCOUNTER — Encounter (HOSPITAL_COMMUNITY): Payer: Self-pay | Admitting: Registered Nurse
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
## 2019-09-15 NOTE — Discharge Instructions (Signed)
Substance Abuse Treatment Programs ° °Intensive Outpatient Programs °[facility]     °[address]      °[place], [place]                   °[phone]      ° °[facility] °[address] °[place], [place] °336-379-7146 ° °[facility]     °(Inpatient and outpatient)     °[address]           °336-832-9800   ° °[facility] °336-288-1484 (Suboxone and Methadone) ° °[address]      °[place], [place] [address]      °336-882-2125      ° °[address] °[place], [place] °852-3033 ° °[facility] (Outpatient/Inpatient, Chemical)    °(insurance only) [phone]      °       °[facility] (Groups & Residential) °[place], [place] °336-389-1413 ° °   °[facility]     °[address]     °[place], [place]      °336-389-1413      ° °[facility] (for caregivers and family) °[address] °[place], [place] °336-299-4655 ° ° ° ° ° °Residential Treatment Programs °[facility]      °[address], [place], [place] [address]  °(336) [phone]      ° °[facility] °[address] [place], [place] [address] °[phone] ° °[facility]        °336-248-8914      ° °[facility] °1-800-659-3381 ° °[facility]             °[address]                                         °[place], [place]                                                °877-615-2722 or 336-784-9470                              ° °[facility] [place] °[address] °[place], [address] °1.877.941.8954 ° °[facility]    °[address]      °[place], [place]     °[phone]      ° °[facility] °[address] °[place], [place] °336-285-9073 ° °[facility]   °[address]     °[place], [place] [address]     °336-899-1550      °Admissions: 8am-3pm M-F ° °[facility] °[address] °[place],  [place] °336-227-7417 ° °[facility]: Residential Program (90 Days)   °[place], [place]      °336-725-8389 or 800-758-6077    ° °[facility] °[place],  °(Walk in Hours over the weekend or by referral) ° °[facility] °[address], [place],  [address] °(336) [phone] ° °Crisis Mobile: [facility]:  1-877-626-1772 (for crisis response 24 hours a day) °[facility] Hotline:      1-800-256-2452 °Outpatient Psychiatry and Counseling ° °[facility]: Mobile Crisis   Management 24 hours:  1-877-626-1772 ° °[facility] sliding scale fee and walk in schedule: M-F 8am-12pm/1pm-3pm °[address]  °[place], [place] [address] °[phone] ° °[facility] °[address] °[place], [place] [address] °336-703-9650 ° °[facility] ([facility] known [facility] new patient walk-in appointments available Monday - Friday 8am -3pm.          °[address] °[place], [place] [address] °336-676-6840 or crisis line- 336-676-6905 ° °[facility] Intensive Outpatient Therapy Program °[address] °[place], [place] [address] °336-832-9804 ° °[facility]                  °Crisis Services      °336.641.4993      °[address]     °[place], [place] [address]                ° °[facility]   °[facility] °[phone] °[address] °[place], [place] [address] ° ° °[facility]          °[address],  °[place], [place] [address]       °(336) [phone] ° °[facility] °[address] °[place], [place] [address] °336-292-1510 ° °[facility]    °[address]    °[place], [place] [address]     °336-632-3505      ° °Parish McKinney, MD     °[address]     °[place] [address]     °336-282-1251     °  °[facility] °[address] °[place] [address] ° °[facility]     °[address]     °[place], [place]      °336-542-2076      ° °[facility] °Shamsher Ahluwalia, MD °[address] °[place], [place] [address] °336-420-9558 ° °[facility]     °[address]     °[place], [place] [address]     °336-274-1237      ° °[facility] °[address] °[place], [place] [address] °336-854-4450 °Resources for Temporary Residential Assistance/Crisis Centers ° °DAY CENTERS °[facility] °M-F 8am-3pm   °[address] [place], [place] [address]   336-332-0824 °Services include: laundry, barbering, support groups, case management, phone  & computer access, showers, AA/NA mtgs, mental health/substance abuse nurse, job skills class, disability information, VA assistance, spiritual classes, etc.  ° °HOMELESS SHELTERS ° °[facility]     °[facility]   °[address], [place]     °336.271.5959       °       °[facility] (women and children)       °[address] °[place], [place] [address] °336-275-0820 °[email or web address] for application and process °Application Required ° °[facility]   °[address]    °[place] [address]     °[phone]       °             °[facility] °[address] °[place], [place] [address] °336.273.5572 °336-235-0363(schedule application appt.) °Application Required ° °[facility] (women only)    °[address]     °[place],  [address]     °336-884-1039      °  Intake starts 6pm daily °Need valid ID, SSC, & Police report °[facility] [place] °[address] °[place], [place] °336-881-5420 °Application Required ° °[facility] (men only)     °[address]      °[place], [place]     °336.748.1962      ° °[facility] °(Pregnant women only) °[address] °[place], [place] °336-275-0206 ° °[facility]      °[address]      °[place], [place] [address]     °336-722-9951      °       °[facility] °[address] °[place], [place] °336-723-1848 °90 day commitment/SA/Application process ° °[facility](men only)     °[address]     °[place], [place]     °336-748-1962       °Check-in at 7pm     °       °[facility] °[address] °[place],  [address] °336-248-6684 °Men/Women/Women and Children must be there by 7 pm ° °[facility] °[place],  °336-722-8721                ° °

## 2019-09-15 NOTE — ED Notes (Signed)
TTS consult complete  
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
## 2019-09-15 NOTE — ED Provider Notes (Signed)
Blood pressure (!) 133/94, pulse 71, temperature 98.7 F (37.1 C), temperature source Oral, resp. rate 17, height 5\' 4"  (1.626 m), weight 95.3 kg, SpO2 100 %.  In short, Darlene Gates is a 34 y.o. female with a chief complaint of Suicidal .  Refer to the original H&P for additional details.  Patient is psych clear. Recommend outpatient follow up. Patient calm and cooperative.     20, MD 09/16/19 [phone]

## 2019-09-15 NOTE — ED Notes (Signed)
Pt in Res B to speak with Dannielle Huh- Behavioral health counselor.

## 2019-09-15 NOTE — BHH Counselor (Signed)
TTS spoke with Nurse Riki Rusk, patient can not be assessed at this time, patient was given Haldol and is not alert/oriented to participate. Nurse to call TTS when pt can complete assessment.

## 2019-09-15 NOTE — ED Notes (Signed)
Patient given meal tray.

## 2019-09-20 DIAGNOSIS — F250 Schizoaffective disorder, bipolar type: Secondary | ICD-10-CM | POA: Diagnosis not present

## 2019-09-21 DIAGNOSIS — F4312 Post-traumatic stress disorder, chronic: Secondary | ICD-10-CM | POA: Diagnosis not present

## 2019-09-21 DIAGNOSIS — Z03818 Encounter for observation for suspected exposure to other biological agents ruled out: Secondary | ICD-10-CM | POA: Diagnosis not present

## 2019-09-22 DIAGNOSIS — Z03818 Encounter for observation for suspected exposure to other biological agents ruled out: Secondary | ICD-10-CM | POA: Diagnosis not present

## 2019-09-24 DIAGNOSIS — Z3009 Encounter for other general counseling and advice on contraception: Secondary | ICD-10-CM | POA: Diagnosis not present

## 2019-09-24 DIAGNOSIS — Z0389 Encounter for observation for other suspected diseases and conditions ruled out: Secondary | ICD-10-CM | POA: Diagnosis not present

## 2019-09-24 DIAGNOSIS — Z1388 Encounter for screening for disorder due to exposure to contaminants: Secondary | ICD-10-CM | POA: Diagnosis not present

## 2019-09-27 DIAGNOSIS — F250 Schizoaffective disorder, bipolar type: Secondary | ICD-10-CM | POA: Diagnosis not present

## 2019-10-04 DIAGNOSIS — F250 Schizoaffective disorder, bipolar type: Secondary | ICD-10-CM | POA: Diagnosis not present

## 2019-10-05 DIAGNOSIS — Z03818 Encounter for observation for suspected exposure to other biological agents ruled out: Secondary | ICD-10-CM | POA: Diagnosis not present

## 2019-10-05 DIAGNOSIS — F250 Schizoaffective disorder, bipolar type: Secondary | ICD-10-CM | POA: Diagnosis not present

## 2019-10-06 DIAGNOSIS — F250 Schizoaffective disorder, bipolar type: Secondary | ICD-10-CM | POA: Diagnosis not present

## 2019-10-07 DIAGNOSIS — F4312 Post-traumatic stress disorder, chronic: Secondary | ICD-10-CM | POA: Diagnosis not present

## 2019-10-12 DIAGNOSIS — F4312 Post-traumatic stress disorder, chronic: Secondary | ICD-10-CM | POA: Diagnosis not present

## 2019-10-14 DIAGNOSIS — F4312 Post-traumatic stress disorder, chronic: Secondary | ICD-10-CM | POA: Diagnosis not present

## 2019-10-14 DIAGNOSIS — F250 Schizoaffective disorder, bipolar type: Secondary | ICD-10-CM | POA: Diagnosis not present

## 2019-10-19 DIAGNOSIS — Z03818 Encounter for observation for suspected exposure to other biological agents ruled out: Secondary | ICD-10-CM | POA: Diagnosis not present

## 2019-10-19 DIAGNOSIS — F250 Schizoaffective disorder, bipolar type: Secondary | ICD-10-CM | POA: Diagnosis not present

## 2019-10-21 DIAGNOSIS — F250 Schizoaffective disorder, bipolar type: Secondary | ICD-10-CM | POA: Diagnosis not present

## 2019-10-26 DIAGNOSIS — F250 Schizoaffective disorder, bipolar type: Secondary | ICD-10-CM | POA: Diagnosis not present

## 2019-10-28 DIAGNOSIS — F250 Schizoaffective disorder, bipolar type: Secondary | ICD-10-CM | POA: Diagnosis not present

## 2019-11-02 DIAGNOSIS — F250 Schizoaffective disorder, bipolar type: Secondary | ICD-10-CM | POA: Diagnosis not present

## 2019-11-02 DIAGNOSIS — Z03818 Encounter for observation for suspected exposure to other biological agents ruled out: Secondary | ICD-10-CM | POA: Diagnosis not present

## 2019-11-03 DIAGNOSIS — F250 Schizoaffective disorder, bipolar type: Secondary | ICD-10-CM | POA: Diagnosis not present

## 2019-11-04 DIAGNOSIS — F250 Schizoaffective disorder, bipolar type: Secondary | ICD-10-CM | POA: Diagnosis not present

## 2019-11-09 DIAGNOSIS — Z20828 Contact with and (suspected) exposure to other viral communicable diseases: Secondary | ICD-10-CM | POA: Diagnosis not present

## 2019-11-09 DIAGNOSIS — F250 Schizoaffective disorder, bipolar type: Secondary | ICD-10-CM | POA: Diagnosis not present

## 2019-11-11 DIAGNOSIS — F250 Schizoaffective disorder, bipolar type: Secondary | ICD-10-CM | POA: Diagnosis not present

## 2019-11-12 ENCOUNTER — Ambulatory Visit: Payer: Self-pay | Admitting: Family Medicine

## 2019-11-16 DIAGNOSIS — F250 Schizoaffective disorder, bipolar type: Secondary | ICD-10-CM | POA: Diagnosis not present

## 2019-11-16 DIAGNOSIS — Z03818 Encounter for observation for suspected exposure to other biological agents ruled out: Secondary | ICD-10-CM | POA: Diagnosis not present

## 2019-11-17 DIAGNOSIS — F250 Schizoaffective disorder, bipolar type: Secondary | ICD-10-CM | POA: Diagnosis not present

## 2019-11-18 ENCOUNTER — Other Ambulatory Visit: Payer: Self-pay

## 2019-11-18 ENCOUNTER — Telehealth (HOSPITAL_COMMUNITY): Payer: Medicaid Other

## 2019-11-18 ENCOUNTER — Telehealth: Payer: Self-pay | Admitting: *Deleted

## 2019-11-18 ENCOUNTER — Ambulatory Visit (INDEPENDENT_AMBULATORY_CARE_PROVIDER_SITE_OTHER): Payer: Medicaid Other | Admitting: Family Medicine

## 2019-11-18 ENCOUNTER — Encounter: Payer: Self-pay | Admitting: Family Medicine

## 2019-11-18 VITALS — BP 118/80 | HR 98 | Wt 178.0 lb

## 2019-11-18 DIAGNOSIS — N926 Irregular menstruation, unspecified: Secondary | ICD-10-CM

## 2019-11-18 DIAGNOSIS — F250 Schizoaffective disorder, bipolar type: Secondary | ICD-10-CM | POA: Diagnosis not present

## 2019-11-18 DIAGNOSIS — Z5900 Homelessness unspecified: Secondary | ICD-10-CM

## 2019-11-18 LAB — POCT URINE PREGNANCY: Preg Test, Ur: NEGATIVE

## 2019-11-18 NOTE — Progress Notes (Signed)
    SUBJECTIVE:   CHIEF COMPLAINT / HPI:   Missed periods/possible pregnancy LMP April 2021.  Endorse spotting 07/2019 for 2 to 3 days.  Patient endorses regular periods prior with the duration of 5 days occurring every 28 days.  Endorses unprotected sexual activity with husband.  Feels as if she is pregnant because she has breast soreness, nausea, vomiting, and cramping. Denies vaginal discharge or odor. Has had several negative pregnancy tests but says the same thing occurred with her son having several negative pregnancy tests. Preclinic screening endorses cough, abdominal pain, runny nose, vomiting, fatigue.  States that she has been vaccinated and had to be vaccinated to live in the shelter.  She does not have her vaccination card with her.  Believes most of the symptoms are due to possible pregnancy.  Homelessness Lost housing in May along with husband due to losing both of their jobs.  Currently living in a shelter. She has two children (13 and 2 who are currently living with her sister)  PERTINENT  PMH / PSH: Bipolar 1 disorder (no medication at this time; seen in ED 09/14/2019 for suicidal ideation and alcoholism was seen by psych and cleared for discharge the following day; does not endorse SI/HI today), positive for trichomoniasis 09/14/2019 patient states she was adequately treated during this time.  OBJECTIVE:   BP 118/80   Pulse 98   Wt 178 lb (80.7 kg)   SpO2 98%   BMI 30.55 kg/m   General: Appears well, no acute distress. Age appropriate. Cardiac: RRR, normal heart sounds, no murmurs Respiratory: CTAB, normal effort Abdomen: soft, nontender, mildly distended Results for SYLVIE, MIFSUD (MRN [id number]) as of 11/19/2019 07:13  Ref. Range 11/18/2019 14:30  Preg Test, Ur Latest Ref Range: Negative  Negative  Results for GALILEA, QUITO (MRN [id number]) as of 11/19/2019 07:13  Ref. Range 09/14/2019 21:19  I-stat hCG, quantitative Latest Ref Range: <5 mIU/mL <5.0    ASSESSMENT/PLAN:   Missed period UPT negative. Patient has sought care multiple times for possible pregnancy with multiple negative pregnancy tests as well as negative beta-hCG.  Consider Pseudocyesis in differential. Likely different etiology for secondary amenorrhea, could consider further hormonal work up as patient is reporting >3 months without menses.  Patient will need to follow-up for full physical exam as well as Pap as we did not have time needed during this encounter due to very late start with Covid-like symptoms from preclinical questions. This provider having to don and doff appropriate PPE outside of [facility] clinic. -Follow-up with PCP for physical and Pap; will also need STD testing (12/2019) -Consider other birth control options such as LARCs if patient desires (Last Depo 05/2019)  Homelessness Currently living in a shelter. Could benefit from resources from CCM.  -CCM referral placed; patient aware of future call   Lavonda Jumbo, DO [facility]

## 2019-11-18 NOTE — Chronic Care Management (AMB) (Signed)
  Care Management   Outreach Note  11/18/2019 Name: Darlene Gates MRN: [id number] DOB: 1985-07-03  Darlene Gates is a 34 y.o. year old female who is a primary care patient of Towanda Octave, MD. I reached out to Darlene Gates by phone today in response to a referral sent by Ms. Darlene Gates's PCP, Towanda Octave, MD.  An unsuccessful telephone outreach was attempted today. The patient was referred to the case management team for assistance with care management and care coordination.   Follow Up Plan: The care management team will reach out to the patient again over the next 7 days. If patient returns call to provider office, please advise to call Embedded Care Management Care Guide Gwenevere Ghazi at [phone].  Gwenevere Ghazi  Care Guide, Embedded Care Coordination [facility] Management

## 2019-11-18 NOTE — Patient Instructions (Addendum)
It was very nice to meet you today. Today you were seen for possible pregnancy. You are not pregnant at this time. I have put in a referral to our chronic care management team, they will give you a call. They help with a lot of resources that you may need. Please schedule your physical today with your PCP.   Please call the clinic at [phone] if you have any concerns. It was our pleasure to serve you.  Lavonda Jumbo, DO [facility]

## 2019-11-19 ENCOUNTER — Encounter: Payer: Self-pay | Admitting: Family Medicine

## 2019-11-19 DIAGNOSIS — N926 Irregular menstruation, unspecified: Secondary | ICD-10-CM | POA: Insufficient documentation

## 2019-11-19 DIAGNOSIS — Z5900 Homelessness unspecified: Secondary | ICD-10-CM | POA: Insufficient documentation

## 2019-11-19 NOTE — Assessment & Plan Note (Signed)
Currently living in a shelter. Could benefit from resources from CCM.  -CCM referral placed; patient aware of future call

## 2019-11-19 NOTE — Assessment & Plan Note (Addendum)
UPT negative. Patient has sought care multiple times for possible pregnancy with multiple negative pregnancy tests as well as negative beta-hCG.  Consider Pseudocyesis in differential. Likely different etiology for secondary amenorrhea, could consider further hormonal work up as patient is reporting >3 months without menses.  Patient will need to follow-up for full physical exam as well as Pap as we did not have time needed during this encounter due to very late start with Covid-like symptoms from preclinical questions. This provider having to don and doff appropriate PPE outside of [facility] clinic. -Follow-up with PCP for physical and Pap; will also need STD testing (12/2019) -Consider other birth control options such as LARCs if patient desires (Last Depo 05/2019)

## 2019-11-24 NOTE — Chronic Care Management (AMB) (Signed)
  Chronic Care Management   Outreach Note  11/24/2019 Name: Gemma Ruan MRN: [id number] DOB: [birth year]  Valen Mascaro is a 34 y.o. year old female who is a primary care patient of Towanda Octave, MD. I reached out to Joyice Faster Forrest by phone today in response to a referral sent by Ms. Joyice Faster Jenson's PCP, Towanda Octave, MD.     A second unsuccessful telephone outreach was attempted today. The patient was referred to the case management team for assistance with care management and care coordination.   Follow Up Plan: The care management team will reach out to the patient again over the next 7 days. If patient returns call to provider office, please advise to call Embedded Care Management Care Guide Gwenevere Ghazi at [phone].  Gwenevere Ghazi  Care Guide, Embedded Care Coordination [facility] Management

## 2019-11-25 DIAGNOSIS — Z3202 Encounter for pregnancy test, result negative: Secondary | ICD-10-CM | POA: Diagnosis not present

## 2019-11-25 DIAGNOSIS — N912 Amenorrhea, unspecified: Secondary | ICD-10-CM | POA: Diagnosis not present

## 2019-11-25 DIAGNOSIS — F250 Schizoaffective disorder, bipolar type: Secondary | ICD-10-CM | POA: Diagnosis not present

## 2019-11-30 DIAGNOSIS — F250 Schizoaffective disorder, bipolar type: Secondary | ICD-10-CM | POA: Diagnosis not present

## 2019-11-30 DIAGNOSIS — Z03818 Encounter for observation for suspected exposure to other biological agents ruled out: Secondary | ICD-10-CM | POA: Diagnosis not present

## 2019-12-02 ENCOUNTER — Telehealth: Payer: Self-pay | Admitting: *Deleted

## 2019-12-02 DIAGNOSIS — F250 Schizoaffective disorder, bipolar type: Secondary | ICD-10-CM | POA: Diagnosis not present

## 2019-12-02 NOTE — Chronic Care Management (AMB) (Signed)
  Care Management   Note  12/02/2019 Name: Deloris Moger MRN: [id number] DOB: 01/30/1986  Jaklyn Alen is a 33 y.o. year old female who is a primary care patient of Towanda Octave, MD. I reached out to Joyice Faster Morgenthaler by phone today in response to a referral sent by Ms. Joyice Faster Whidby's health plan.    Ms. Backer was given information about care management services today including:  1. Care management services include personalized support from designated clinical staff supervised by her physician, including individualized plan of care and coordination with other care providers 2. 24/7 contact phone numbers for assistance for urgent and routine care needs. 3. The patient may stop care management services at any time by phone call to the office staff.  Patient agreed to services and verbal consent obtained.   Follow up plan: Telephone appointment with care management team member scheduled for: 01/05/2020  [facility] Guide, Embedded Care Coordination [facility] Management

## 2019-12-02 NOTE — Chronic Care Management (AMB) (Signed)
   Care Management   Outreach Note  12/02/2019 Name: Darlene Gates MRN: [id number] DOB: 09/18/1985  Darlene Gates is a 34 y.o. year old female who is a primary care patient of Towanda Octave, MD. I reached out to Darlene Gates by phone today in response to a referral sent by Ms. Darlene Gates's PCP, Towanda Octave, MD.     Third unsuccessful telephone outreach was attempted today. The patient was referred to the case management team for assistance with care management and care coordination. The patient's primary care provider has been notified of our unsuccessful attempts to make or maintain contact with the patient. The care management team is pleased to engage with this patient at any time in the future should he/she be interested in assistance from the care management team.   Follow Up Plan: We have been unable to make contact with the patient for follow up. The care management team is available to follow up with the patient after provider conversation with the patient regarding recommendation for care management engagement and subsequent re-referral to the care management team.   [facility] Guide, Embedded Care Coordination [facility]  Care Management

## 2019-12-08 DIAGNOSIS — F250 Schizoaffective disorder, bipolar type: Secondary | ICD-10-CM | POA: Diagnosis not present

## 2019-12-09 DIAGNOSIS — F4312 Post-traumatic stress disorder, chronic: Secondary | ICD-10-CM | POA: Diagnosis not present

## 2019-12-09 DIAGNOSIS — F250 Schizoaffective disorder, bipolar type: Secondary | ICD-10-CM | POA: Diagnosis not present

## 2019-12-12 ENCOUNTER — Telehealth (HOSPITAL_COMMUNITY): Payer: Medicaid Other | Admitting: Adult Health

## 2019-12-13 ENCOUNTER — Encounter: Payer: Medicaid Other | Admitting: Family Medicine

## 2019-12-14 DIAGNOSIS — F4312 Post-traumatic stress disorder, chronic: Secondary | ICD-10-CM | POA: Diagnosis not present

## 2019-12-14 DIAGNOSIS — Z03818 Encounter for observation for suspected exposure to other biological agents ruled out: Secondary | ICD-10-CM | POA: Diagnosis not present

## 2019-12-15 DIAGNOSIS — F250 Schizoaffective disorder, bipolar type: Secondary | ICD-10-CM | POA: Diagnosis not present

## 2019-12-16 DIAGNOSIS — F250 Schizoaffective disorder, bipolar type: Secondary | ICD-10-CM | POA: Diagnosis not present

## 2019-12-17 DIAGNOSIS — F4312 Post-traumatic stress disorder, chronic: Secondary | ICD-10-CM | POA: Diagnosis not present

## 2019-12-23 DIAGNOSIS — F250 Schizoaffective disorder, bipolar type: Secondary | ICD-10-CM | POA: Diagnosis not present

## 2019-12-28 DIAGNOSIS — Z03818 Encounter for observation for suspected exposure to other biological agents ruled out: Secondary | ICD-10-CM | POA: Diagnosis not present

## 2019-12-30 DIAGNOSIS — F250 Schizoaffective disorder, bipolar type: Secondary | ICD-10-CM | POA: Diagnosis not present

## 2020-01-05 ENCOUNTER — Telehealth: Payer: Self-pay | Admitting: Licensed Clinical Social Worker

## 2020-01-05 ENCOUNTER — Telehealth: Payer: Medicaid Other

## 2020-01-05 NOTE — Chronic Care Management (AMB) (Signed)
    Clinical Social Work  Care Management Outreach   01/05/2020 Name: Darlene Gates MRN: [id number] DOB: 1986/03/19  Darlene Gates is a 34 y.o. year old female who is a primary care patient of Towanda Octave, MD .   LCSW reached out to Devone Bonilla today by phone to introduce self, assess needs and offer Care Management services and interventions. LCSW received referral to assist patient with mental health resources.  The outreach was unsuccessful.  Patient also provided careguide with a 2nd phone number [phone]  Informed her that she would be going to [facility] for a few days  Based on chart review, patient has appointment with PCP 01/10/2021 and appointment with [facility] 10/16/222 Plan: The care management team will reach out to the patient again over the next 5 to 7 days days.   Review of patient status, including review of consultants reports, relevant laboratory and other test results, and collaboration with appropriate care team members and the patient's provider was performed as part of comprehensive patient evaluation and provision of care management services.    Sammuel Hines, LCSW Care Management & Coordination  [facility] / [facility]   [phone] 3:05 PM

## 2020-01-06 DIAGNOSIS — F250 Schizoaffective disorder, bipolar type: Secondary | ICD-10-CM | POA: Diagnosis not present

## 2020-01-11 ENCOUNTER — Encounter: Payer: Medicaid Other | Admitting: Family Medicine

## 2020-01-11 DIAGNOSIS — Z03818 Encounter for observation for suspected exposure to other biological agents ruled out: Secondary | ICD-10-CM | POA: Diagnosis not present

## 2020-01-13 ENCOUNTER — Ambulatory Visit (HOSPITAL_COMMUNITY)
Admission: EM | Admit: 2020-01-13 | Discharge: 2020-01-14 | Disposition: A | Discharge status: Other Acute Inpatient Hospital | Payer: Medicaid Other | Attending: Nurse Practitioner | Admitting: Nurse Practitioner

## 2020-01-13 ENCOUNTER — Other Ambulatory Visit: Payer: Self-pay

## 2020-01-13 DIAGNOSIS — F1420 Cocaine dependence, uncomplicated: Secondary | ICD-10-CM | POA: Diagnosis not present

## 2020-01-13 DIAGNOSIS — F1020 Alcohol dependence, uncomplicated: Secondary | ICD-10-CM | POA: Diagnosis not present

## 2020-01-13 DIAGNOSIS — F1994 Other psychoactive substance use, unspecified with psychoactive substance-induced mood disorder: Secondary | ICD-10-CM | POA: Diagnosis not present

## 2020-01-13 DIAGNOSIS — Z20822 Contact with and (suspected) exposure to covid-19: Secondary | ICD-10-CM | POA: Insufficient documentation

## 2020-01-13 DIAGNOSIS — Z79899 Other long term (current) drug therapy: Secondary | ICD-10-CM | POA: Insufficient documentation

## 2020-01-13 DIAGNOSIS — F17200 Nicotine dependence, unspecified, uncomplicated: Secondary | ICD-10-CM | POA: Insufficient documentation

## 2020-01-13 DIAGNOSIS — Z79811 Long term (current) use of aromatase inhibitors: Secondary | ICD-10-CM | POA: Insufficient documentation

## 2020-01-13 DIAGNOSIS — F250 Schizoaffective disorder, bipolar type: Secondary | ICD-10-CM | POA: Diagnosis not present

## 2020-01-13 LAB — POCT URINE DRUG SCREEN - MANUAL ENTRY (I-SCREEN)
POC Amphetamine UR: NOT DETECTED
POC Buprenorphine (BUP): NOT DETECTED
POC Cocaine UR: POSITIVE — AB
POC Marijuana UR: NOT DETECTED
POC Methadone UR: NOT DETECTED
POC Methamphetamine UR: NOT DETECTED
POC Morphine: NOT DETECTED
POC Oxazepam (BZO): NOT DETECTED
POC Oxycodone UR: POSITIVE — AB
POC Secobarbital (BAR): NOT DETECTED

## 2020-01-13 LAB — POC SARS CORONAVIRUS 2 AG -  ED: SARS Coronavirus 2 Ag: NEGATIVE

## 2020-01-13 LAB — POCT PREGNANCY, URINE: Preg Test, Ur: NEGATIVE

## 2020-01-13 LAB — POC SARS CORONAVIRUS 2 AG: SARS Coronavirus 2 Ag: NEGATIVE

## 2020-01-13 MED ORDER — LOPERAMIDE HCL 2 MG PO CAPS: 2.0000 mg | ORAL_CAPSULE | ORAL | Status: DC | PRN

## 2020-01-13 MED ORDER — HYDROXYZINE HCL 25 MG PO TABS: 25.0000 mg | ORAL_TABLET | Freq: Three times a day (TID) | ORAL | Status: DC | PRN

## 2020-01-13 MED ORDER — ADULT MULTIVITAMIN W/MINERALS CH
1.0000 | ORAL_TABLET | Freq: Every day | ORAL | Status: DC
  Administered 2020-01-14: 1 via ORAL
  Filled 2020-01-13: qty 1

## 2020-01-13 MED ORDER — MAGNESIUM HYDROXIDE 400 MG/5ML PO SUSP: 30.0000 mL | Freq: Every day | ORAL | Status: DC | PRN

## 2020-01-13 MED ORDER — ALUM & MAG HYDROXIDE-SIMETH 200-200-20 MG/5ML PO SUSP: 30.0000 mL | ORAL | Status: DC | PRN

## 2020-01-13 MED ORDER — THIAMINE HCL 100 MG PO TABS
100.0000 mg | ORAL_TABLET | Freq: Every day | ORAL | Status: DC
  Administered 2020-01-14: 100 mg via ORAL
  Filled 2020-01-13: qty 1

## 2020-01-13 MED ORDER — GABAPENTIN 400 MG PO CAPS
400.0000 mg | ORAL_CAPSULE | Freq: Three times a day (TID) | ORAL | Status: DC
  Administered 2020-01-14: 400 mg via ORAL
  Filled 2020-01-13: qty 1

## 2020-01-13 MED ORDER — LORAZEPAM 1 MG PO TABS: 1.0000 mg | ORAL_TABLET | Freq: Four times a day (QID) | ORAL | Status: DC | PRN

## 2020-01-13 MED ORDER — OLANZAPINE 5 MG PO TBDP
5.0000 mg | ORAL_TABLET | Freq: Once | ORAL | Status: AC
  Administered 2020-01-14: 5 mg via ORAL
  Filled 2020-01-13: qty 1

## 2020-01-13 MED ORDER — ONDANSETRON 4 MG PO TBDP
4.0000 mg | ORAL_TABLET | Freq: Four times a day (QID) | ORAL | Status: DC | PRN
  Administered 2020-01-14: 4 mg via ORAL
  Filled 2020-01-13: qty 1

## 2020-01-13 MED ORDER — LORAZEPAM 1 MG PO TABS
1.0000 mg | ORAL_TABLET | Freq: Once | ORAL | Status: AC
  Administered 2020-01-14: 1 mg via ORAL
  Filled 2020-01-13: qty 1
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
## 2020-01-13 NOTE — ED Provider Notes (Addendum)
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 Preg Test, Ur 08/05/2019 Negative  Negative Final  . Neisseria Gonorrhea 08/05/2019 Negative   Final  . Chlamydia 08/05/2019 Negative   Final  . Trichomonas 08/05/2019 Positive*  Final  . Bacterial Vaginitis (gardnerella) 08/05/2019 Positive*  Final  . Candida Vaginitis 08/05/2019 Negative   Final  . Candida Glabrata 08/05/2019 Negative   Final  . Comment 08/05/2019 Normal Reference Range Bacterial Vaginosis - Negative   Final  . Comment 08/05/2019 Normal Reference Range Candida  Species - Negative   Final  . Comment 08/05/2019 Normal Reference Range Candida Galbrata - Negative   Final  . Comment 08/05/2019 Normal Reference Range Trichomonas - Negative   Final  . Comment 08/05/2019 Normal Reference Ranger Chlamydia - Negative   Final  . Comment 08/05/2019 Normal Reference Range Neisseria Gonorrhea - Negative   Final  . Color, UA 08/05/2019 yellow  yellow Final  . Clarity, UA 08/05/2019 clear  clear Final  . Glucose, UA 08/05/2019 negative  negative mg/dL Final  . Bilirubin, UA 08/05/2019 negative  negative Final  . Ketones, POC UA 08/05/2019 negative  negative mg/dL Final  . Spec Grav, UA 08/05/2019 1.025  1.010 - 1.025 Final  . Blood, UA 08/05/2019 trace-intact* negative Final  . pH, UA 08/05/2019 7.0  5.0 - 8.0 Final  . POC PROTEIN,UA 08/05/2019 negative  negative, trace Final  . Urobilinogen, UA 08/05/2019 0.2  0.2 or 1.0 E.U./dL Final  . Nitrite, UA 66/08/3014 Negative  Negative Final  . Leukocytes, UA 08/05/2019 Moderate (2+)* Negative Final    Allergies: Bee pollen, Chlorpromazine, Other, Oxycodone-acetaminophen, Peanut oil, Pineapple, and Promethazine  PTA Medications: (Not in a hospital admission)   Medical Decision Making  Admission labs ordered  Patient reports that she was taking gabapentin 300 mg TID, but it wasn't helpful. Increase gabapentin to 600 mg TID for withdrawal symptoms/mood stability Ativan 1 mg x 1 dose for anxiety/irritability Zyprexa 5 mg x 1 dose for AH/irritability Initiate Ativan CIWA protocol with ativan 1 mg every 6 hours prn for CIWA >10 Hydroxyzine 25 mg TID prn for anxiety   Clinical Course as of Jan 14 408  Fri Jan 14, 2020  0354 TSH: 0.995 [JB]  0355 Creatinine 1.12, CMP otherwise unremarkable  Creatinine(!): 1.12 [JB]  0355 CBC unremarkable  CBC with Differential/Platelet(!) [JB]  0355 UDS positive for cocaine and opiates  POCT Urine Drug Screen - (ICup)(!) [JB]    Clinical Course User Index [JB] Jackelyn Poling, NP    Recommendations  Based on my evaluation the patient does not appear to have an emergency medical condition.   Patient will be placed in the continuous assessment area at [facility] for treatment and stabilization. She will be reevaluated on 01/14/2020. The treatment team will determine disposition at that time.      Jackelyn Poling, NP 01/14/20  4:09 AM

## 2020-01-14 ENCOUNTER — Inpatient Hospital Stay (HOSPITAL_COMMUNITY)
Admission: AD | Admit: 2020-01-14 | Discharge: 2020-01-21 | DRG: 885 | Disposition: A | Discharge status: Home / Self Care | Payer: Medicaid Other | Source: Intra-hospital | Attending: Psychiatry | Admitting: Psychiatry

## 2020-01-14 ENCOUNTER — Other Ambulatory Visit: Payer: Self-pay

## 2020-01-14 ENCOUNTER — Encounter (HOSPITAL_COMMUNITY): Payer: Self-pay | Admitting: Emergency Medicine

## 2020-01-14 DIAGNOSIS — Z91010 Allergy to peanuts: Secondary | ICD-10-CM | POA: Diagnosis not present

## 2020-01-14 DIAGNOSIS — Z888 Allergy status to other drugs, medicaments and biological substances status: Secondary | ICD-10-CM

## 2020-01-14 DIAGNOSIS — F1029 Alcohol dependence with unspecified alcohol-induced disorder: Secondary | ICD-10-CM | POA: Diagnosis not present

## 2020-01-14 DIAGNOSIS — Z62810 Personal history of physical and sexual abuse in childhood: Secondary | ICD-10-CM | POA: Diagnosis present

## 2020-01-14 DIAGNOSIS — F411 Generalized anxiety disorder: Secondary | ICD-10-CM | POA: Diagnosis present

## 2020-01-14 DIAGNOSIS — F17210 Nicotine dependence, cigarettes, uncomplicated: Secondary | ICD-10-CM | POA: Diagnosis present

## 2020-01-14 DIAGNOSIS — Z811 Family history of alcohol abuse and dependence: Secondary | ICD-10-CM | POA: Diagnosis not present

## 2020-01-14 DIAGNOSIS — F3289 Other specified depressive episodes: Secondary | ICD-10-CM | POA: Diagnosis not present

## 2020-01-14 DIAGNOSIS — Z91018 Allergy to other foods: Secondary | ICD-10-CM | POA: Diagnosis not present

## 2020-01-14 DIAGNOSIS — Z885 Allergy status to narcotic agent status: Secondary | ICD-10-CM | POA: Diagnosis not present

## 2020-01-14 DIAGNOSIS — Z20822 Contact with and (suspected) exposure to covid-19: Secondary | ICD-10-CM | POA: Diagnosis present

## 2020-01-14 DIAGNOSIS — Z5900 Homelessness unspecified: Secondary | ICD-10-CM | POA: Diagnosis not present

## 2020-01-14 DIAGNOSIS — G2581 Restless legs syndrome: Secondary | ICD-10-CM | POA: Diagnosis present

## 2020-01-14 DIAGNOSIS — Z9151 Personal history of suicidal behavior: Secondary | ICD-10-CM | POA: Diagnosis not present

## 2020-01-14 DIAGNOSIS — Z23 Encounter for immunization: Secondary | ICD-10-CM | POA: Diagnosis not present

## 2020-01-14 DIAGNOSIS — F1020 Alcohol dependence, uncomplicated: Secondary | ICD-10-CM | POA: Diagnosis present

## 2020-01-14 DIAGNOSIS — Y906 Blood alcohol level of 120-199 mg/100 ml: Secondary | ICD-10-CM | POA: Diagnosis present

## 2020-01-14 DIAGNOSIS — F250 Schizoaffective disorder, bipolar type: Secondary | ICD-10-CM | POA: Diagnosis not present

## 2020-01-14 DIAGNOSIS — Z818 Family history of other mental and behavioral disorders: Secondary | ICD-10-CM | POA: Diagnosis not present

## 2020-01-14 DIAGNOSIS — G4700 Insomnia, unspecified: Secondary | ICD-10-CM | POA: Diagnosis present

## 2020-01-14 DIAGNOSIS — F1410 Cocaine abuse, uncomplicated: Secondary | ICD-10-CM | POA: Diagnosis present

## 2020-01-14 DIAGNOSIS — F1110 Opioid abuse, uncomplicated: Secondary | ICD-10-CM | POA: Diagnosis present

## 2020-01-14 DIAGNOSIS — F314 Bipolar disorder, current episode depressed, severe, without psychotic features: Secondary | ICD-10-CM | POA: Diagnosis not present

## 2020-01-14 DIAGNOSIS — F1994 Other psychoactive substance use, unspecified with psychoactive substance-induced mood disorder: Secondary | ICD-10-CM | POA: Diagnosis present

## 2020-01-14 DIAGNOSIS — R45851 Suicidal ideations: Secondary | ICD-10-CM | POA: Diagnosis present

## 2020-01-14 DIAGNOSIS — F1510 Other stimulant abuse, uncomplicated: Secondary | ICD-10-CM | POA: Diagnosis present

## 2020-01-14 DIAGNOSIS — Z91030 Bee allergy status: Secondary | ICD-10-CM | POA: Diagnosis not present

## 2020-01-14 DIAGNOSIS — F32A Depression, unspecified: Secondary | ICD-10-CM | POA: Diagnosis present

## 2020-01-14 DIAGNOSIS — Z87892 Personal history of anaphylaxis: Secondary | ICD-10-CM

## 2020-01-14 DIAGNOSIS — M79606 Pain in leg, unspecified: Secondary | ICD-10-CM | POA: Diagnosis not present

## 2020-01-14 DIAGNOSIS — M549 Dorsalgia, unspecified: Secondary | ICD-10-CM | POA: Diagnosis not present

## 2020-01-14 LAB — COMPREHENSIVE METABOLIC PANEL
ALT: 31 U/L (ref 0–44)
AST: 36 U/L (ref 15–41)
Albumin: 4.5 g/dL (ref 3.5–5.0)
Alkaline Phosphatase: 85 U/L (ref 38–126)
Anion gap: 13 (ref 5–15)
BUN: 6 mg/dL (ref 6–20)
CO2: 24 mmol/L (ref 22–32)
Calcium: 9.5 mg/dL (ref 8.9–10.3)
Chloride: 106 mmol/L (ref 98–111)
Creatinine, Ser: 1.12 mg/dL — ABNORMAL HIGH (ref 0.44–1.00)
GFR, Estimated: >60 mL/min (ref >60)
Glucose, Bld: 78 mg/dL (ref 70–99)
Potassium: 4.1 mmol/L (ref 3.5–5.1)
Sodium: 143 mmol/L (ref 135–145)
Total Bilirubin: 1.1 mg/dL (ref 0.3–1.2)
Total Protein: 7.6 g/dL (ref 6.5–8.1)

## 2020-01-14 LAB — TSH: TSH: 0.995 u[IU]/mL (ref 0.350–4.500)

## 2020-01-14 LAB — CBC WITH DIFFERENTIAL/PLATELET
Abs Immature Granulocytes: 0.01 10*3/uL (ref 0.00–0.07)
Basophils Absolute: 0.1 10*3/uL (ref 0.0–0.1)
Basophils Relative: 1 %
Eosinophils Absolute: 0.1 10*3/uL (ref 0.0–0.5)
Eosinophils Relative: 1 %
HCT: 43.7 % (ref 36.0–46.0)
Hemoglobin: 14.6 g/dL (ref 12.0–15.0)
Immature Granulocytes: 0 %
Lymphocytes Relative: 69 %
Lymphs Abs: 4.8 10*3/uL — ABNORMAL HIGH (ref 0.7–4.0)
MCH: 32.7 pg (ref 26.0–34.0)
MCHC: 33.4 g/dL (ref 30.0–36.0)
MCV: 97.8 fL (ref 80.0–100.0)
Monocytes Absolute: 0.4 10*3/uL (ref 0.1–1.0)
Monocytes Relative: 6 %
Neutro Abs: 1.6 10*3/uL — ABNORMAL LOW (ref 1.7–7.7)
Neutrophils Relative %: 23 %
Platelets: 307 10*3/uL (ref 150–400)
RBC: 4.47 MIL/uL (ref 3.87–5.11)
RDW: 13.2 % (ref 11.5–15.5)
WBC: 7 10*3/uL (ref 4.0–10.5)
nRBC: 0 % (ref 0.0–0.2)

## 2020-01-14 LAB — LIPID PANEL
Cholesterol: 161 mg/dL (ref 0–200)
HDL: 70 mg/dL (ref >40)
LDL Cholesterol: 74 mg/dL (ref 0–99)
Total CHOL/HDL Ratio: 2.3 RATIO
Triglycerides: 87 mg/dL (ref <150)
VLDL: 17 mg/dL (ref 0–40)

## 2020-01-14 LAB — RESPIRATORY PANEL BY RT PCR (FLU A&B, COVID)
Influenza A by PCR: NEGATIVE
Influenza B by PCR: NEGATIVE
SARS Coronavirus 2 by RT PCR: NEGATIVE

## 2020-01-14 MED ORDER — OLANZAPINE 5 MG PO TBDP
5.0000 mg | ORAL_TABLET | Freq: Two times a day (BID) | ORAL | Status: DC
  Administered 2020-01-14 (×2): 5 mg via ORAL
  Filled 2020-01-14 (×2): qty 1

## 2020-01-14 MED ORDER — GABAPENTIN 600 MG PO TABS
600.0000 mg | ORAL_TABLET | Freq: Three times a day (TID) | ORAL | Status: DC
  Administered 2020-01-14 (×3): 600 mg via ORAL
  Filled 2020-01-14 (×3): qty 1

## 2020-01-14 MED ORDER — GABAPENTIN 600 MG PO TABS: 600.0000 mg | ORAL_TABLET | Freq: Three times a day (TID) | ORAL | Status: DC

## 2020-01-14 MED ORDER — OLANZAPINE 5 MG PO TBDP: 5.0000 mg | ORAL_TABLET | Freq: Two times a day (BID) | ORAL | Status: DC

## 2020-01-14 NOTE — ED Notes (Signed)
Patient given breakfast; muffin, apple juice

## 2020-01-14 NOTE — Progress Notes (Signed)
Darlene Gates has been sleeping all day between meals and bathroom visits. She has not been in the mood to verbalize with this Clinical research associate.

## 2020-01-14 NOTE — ED Provider Notes (Signed)
FBC/OBS ASAP Discharge Summary  Date and Time: 01/14/2020 5:40 PM  Name: Darlene Gates  MRN:  [id number]   Discharge Diagnoses:  Final diagnoses:  Substance induced mood disorder (HCC)  Cocaine use disorder, severe, dependence (HCC)  Alcohol use disorder, moderate, dependence (HCC)    Subjective: "I am very depressed and feel suicidal. I need mental health treatment, detox or rehab."   Stay Summary: The patient was seen and examined face to face. She is alert, oriented and cooperative. Her speech is logical/coherent with a normal rate and tone. She appears disheveled and is dressed in scrubs. She reports suicidal thoughts with no plan or intent. She is unable to contract for safety if she doesn't get treatment. She denies HI. She reports AVH, seeing snakes and monsters and voices that sound demonic." She denies command voices and reports hearing the voices everyday. She does not appear to be responding to internal or external stimuli. She reports using crack cocaine and daily alcohol use, drinking 40 oz beer daily. She reports withdrawal symptoms of shakes, nausea, vomiting and diarrhea.   Per chart review: States that she was [facility] [place] in September for detox and then [facility] [place] [facility] [place]. States that she was discharged from [facility] [place]  after 3-4 days due to a confrontation with a female resident.   The patient is recommended for inpatient treatment for mood stabilization and detox. She is under review and awaiting bed [facility], [facility] [place]. Labs reviewed, patient is positive for opiates and cocaine. No BAL obtained on arrival. Vital signs reviewed. Medications reviewed and patient reports tolerating medications well and denies any medication side effects.   Total Time spent with patient: 20 minutes  Past Psychiatric History: Polysubstance abuse, bipolar disorder Past Medical History:  Past Medical History:  Diagnosis Date    Anxiety    Bipolar 1 disorder (HCC)    Depression    History of alcohol abuse    History of substance abuse (HCC)    History reviewed. No pertinent surgical history. Family History:  Family History  Problem Relation Age of Onset   Alcohol abuse Mother    Bipolar disorder Mother    Depression Mother    Alcohol abuse Father    Hyperlipidemia Father    Hypertension Father    Alcohol abuse Maternal Grandmother    Bipolar disorder Maternal Grandmother    Alcohol abuse Maternal Grandfather    Family Psychiatric History: See above Social History:  Social History   Substance and Sexual Activity  Alcohol Use Not Currently     Social History   Substance and Sexual Activity  Drug Use Not Currently    Social History   Socioeconomic History   Marital status: Married    Spouse name: Not on file   Number of children: Not on file   Years of education: Not on file   Highest education level: Not on file  Occupational History   Not on file  Tobacco Use   Smoking status: Current Every Day Smoker    Types: Cigarettes   Smokeless tobacco: Never Used  Substance and Sexual Activity   Alcohol use: Not Currently   Drug use: Not Currently   Sexual activity: Yes    Partners: Male    Birth control/protection: Injection, Condom  Other Topics Concern   Not on file  Social History Narrative   Not on file   Social Determinants of Corporate investment bankerHealth   Financial Resource  Strain:    Difficulty of Paying Living Expenses: Not on file  Food Insecurity:    Worried About Running Out of Food in the Last Year: Not on file   Ran Out of Food in the Last Year: Not on file  Transportation Needs:    Lack of Transportation (Medical): Not on file   Lack of Transportation (Non-Medical): Not on file  Physical Activity:    Days of Exercise per Week: Not on file   Minutes of Exercise per Session: Not on file  Stress:    Feeling of Stress : Not on file  Social Connections:     Frequency of Communication with Friends and Family: Not on file   Frequency of Social Gatherings with Friends and Family: Not on file   Attends Religious Services: Not on file   Active Member of Clubs or Organizations: Not on file   Attends Banker Meetings: Not on file   Marital Status: Not on file   SDOH:  SDOH Screenings   Alcohol Screen:    Last Alcohol Screening Score (AUDIT): Not on file  Depression (PHQ2-9): Medium Risk   PHQ-2 Score: 13  Financial Resource Strain:    Difficulty of Paying Living Expenses: Not on file  Food Insecurity:    Worried About Programme researcher, broadcasting/film/video in the Last Year: Not on file   [employer] of Food in the Last Year: Not on file  Housing:    Last Housing Risk Score: Not on file  Physical Activity:    Days of Exercise per Week: Not on file   Minutes of Exercise per Session: Not on file  Social Connections:    Frequency of Communication with Friends and Family: Not on file   Frequency of Social Gatherings with Friends and Family: Not on file   Attends Religious Services: Not on file   Active Member of Clubs or Organizations: Not on file   Attends Banker Meetings: Not on file   Marital Status: Not on file  Stress:    Feeling of Stress : Not on file  Tobacco Use: High Risk   Smoking Tobacco Use: Current Every Day Smoker   Smokeless Tobacco Use: Never Used  Transportation Needs:    Freight forwarder (Medical): Not on file   Lack of Transportation (Non-Medical): Not on file    Has this patient used any form of tobacco in the last 30 days? (Cigarettes, Smokeless Tobacco, Cigars, and/or Pipes) A prescription for an FDA-approved tobacco cessation medication was offered at discharge and the patient refused  Current Medications:  Current Facility-Administered Medications  Medication Dose Route Frequency Provider Last Rate Last Admin   alum & mag hydroxide-simeth (MAALOX/MYLANTA) 200-200-20 MG/5ML  suspension 30 mL  30 mL Oral Q4H PRN Jackelyn Poling, NP       gabapentin (NEURONTIN) tablet 600 mg  600 mg Oral TID Nira Conn A, NP   600 mg at 01/14/20 0930   hydrOXYzine (ATARAX/VISTARIL) tablet 25 mg  25 mg Oral TID PRN Jackelyn Poling, NP       loperamide (IMODIUM) capsule 2-4 mg  2-4 mg Oral PRN Nira Conn A, NP       LORazepam (ATIVAN) tablet 1 mg  1 mg Oral Q6H PRN Nira Conn A, NP       magnesium hydroxide (MILK OF MAGNESIA) suspension 30 mL  30 mL Oral Daily PRN Nira Conn A, NP       multivitamin with minerals tablet 1  tablet  1 tablet Oral Daily Nira Conn A, NP   1 tablet at 01/14/20 0930   OLANZapine zydis (ZYPREXA) disintegrating tablet 5 mg  5 mg Oral BID Elvena Oyer, Gerlene Burdock, FNP   5 mg at 01/14/20 0930   ondansetron (ZOFRAN-ODT) disintegrating tablet 4 mg  4 mg Oral Q6H PRN Nira Conn A, NP   4 mg at 01/14/20 0006   thiamine tablet 100 mg  100 mg Oral Daily Nira Conn A, NP   100 mg at 01/14/20 0930   Current Outpatient Medications  Medication Sig Dispense Refill   ARIPiprazole (ABILIFY) 2 MG tablet Take 2 mg by mouth daily.     busPIRone (BUSPAR) 7.5 MG tablet Take 7.5 mg by mouth 2 (two) times daily.     gabapentin (NEURONTIN) 300 MG capsule Take 300 mg by mouth 3 (three) times daily.     hydrOXYzine (ATARAX/VISTARIL) 50 MG tablet Take 50 mg by mouth every 8 (eight) hours as needed for anxiety.      naltrexone (DEPADE) 50 MG tablet Take 50 mg by mouth daily.     naproxen (NAPROSYN) 500 MG tablet Take 500 mg by mouth 2 (two) times daily.     prazosin (MINIPRESS) 1 MG capsule Take 1 mg by mouth at bedtime.     topiramate (TOPAMAX) 25 MG tablet Take 25 mg by mouth 2 (two) times daily.     venlafaxine (EFFEXOR) 75 MG tablet Take 75 mg by mouth 2 (two) times daily.     gabapentin (NEURONTIN) 600 MG tablet Take 1 tablet (600 mg total) by mouth 3 (three) times daily.     OLANZapine zydis (ZYPREXA) 5 MG disintegrating tablet Take 1 tablet (5 mg total) by  mouth 2 (two) times daily.      PTA Medications: (Not in a hospital admission)   Musculoskeletal  Strength & Muscle Tone: within normal limits Gait & Station: normal Patient leans: N/A  Psychiatric Specialty Exam  Presentation  General Appearance: Disheveled  Eye Contact:Fair  Speech:Clear and Coherent  Speech Volume:Normal  Handedness:Right   Mood and Affect  Mood:Depressed  Affect:Congruent   Thought Process  Thought Processes:Coherent  Descriptions of Associations:Intact  Orientation:Full (Time, Place and Person)  Thought Content:Logical  Hallucinations:Hallucinations: Auditory;Visual Description of Command Hallucinations: "voices sound demonic" Description of Auditory Hallucinations: "see snakes and monsters" Description of Visual Hallucinations: states that she sees demons and snakes  Ideas of Reference:None  Suicidal Thoughts:Suicidal Thoughts: Yes, Passive  Homicidal Thoughts:Homicidal Thoughts: No HI Active Intent and/or Plan: With Intent;With Plan;With Means to Carry Out   Sensorium  Memory:Immediate Fair;Remote Fair;Recent Fair  Judgment:Fair  Insight:Fair   Executive Functions  Concentration:Fair  Attention Span:Fair  Recall:Fair  Fund of Knowledge:Fair  Language:Fair   Psychomotor Activity  Psychomotor Activity:Psychomotor Activity: Normal   Assets  Assets:Communication Skills;Desire for Improvement;Physical Health   Sleep  Sleep:Sleep: Poor   Physical Exam  Physical Exam Vitals and nursing note reviewed.  Constitutional:      Appearance: She is well-developed.  HENT:     Head: Normocephalic.  Eyes:     Pupils: Pupils are equal, round, and reactive to light.  Cardiovascular:     Rate and Rhythm: Normal rate.  Pulmonary:     Effort: Pulmonary effort is normal.  Musculoskeletal:        General: Normal range of motion.  Neurological:     Mental Status: She is alert and oriented to person, place, and time.     Review of Systems  Constitutional:  Negative.   HENT: Negative.   Eyes: Negative.   Respiratory: Negative.   Cardiovascular: Negative.   Gastrointestinal: Negative.   Genitourinary: Negative.   Musculoskeletal: Negative.   Skin: Negative.   Neurological: Negative.   Endo/Heme/Allergies: Negative.   Psychiatric/Behavioral: Positive for depression, hallucinations, substance abuse and suicidal ideas.   Blood pressure (!) 112/54, pulse 92, temperature 98.2 F (36.8 C), temperature source Temporal, resp. rate 18, SpO2 97 %. There is no height or weight on file to calculate BMI.  Disposition: Discharge to [facility]  Maryfrances Bunnell, FNP 01/14/2020, 5:40 PM

## 2020-01-14 NOTE — ED Notes (Signed)
Report called to [facility], RN Rudi.  Pending Safe transport. To [facility] rm [phone].

## 2020-01-14 NOTE — ED Notes (Addendum)
Safe Transport requested. 

## 2020-01-14 NOTE — ED Notes (Signed)
Muffin and juice given. 

## 2020-01-14 NOTE — ED Notes (Signed)
Pt awake, alert & responsive, presents with suicidal thoughts, plan to get hit by a car, overdose or bash her head in she reports. Symptoms for past 2 weeks.  Pt admits to abusing crack cocaine and alcohol.  Skin search completed, monitoring for safety, no distress noted.

## 2020-01-14 NOTE — ED Notes (Signed)
Salad, ranch dressing, and Apple juice given

## 2020-01-14 NOTE — ED Provider Notes (Signed)
Behavioral Health Progress Note  Date and Time: 01/14/2020 3:38 PM Name: Darlene Gates MRN:  [id number]   Subjective:  "I am very depressed. I need mental health treatment, detox or rehab."   The patient was seen and examined face to face. She is alert, oriented and cooperative. Her speech is logical/coherent with a normal rate and tone. She appears disheveled and is dressed in scrubs. She reports suicidal thoughts with no plan or intent. She is unable to contract for safety if she doesn't get treatment. She denies HI. She reports AVH, seeing snakes and monsters and voices that sound demonic." She denies command voices and reports hearing the voices everyday. She does not appear to be responding to internal or external stimuli. She reports using crack cocaine and daily alcohol use, drinking 40 oz beer daily. She reports withdrawal symptoms of shakes, nausea, vomiting and diarrhea.   Per chart review: States that she was at [facility] in [place] in September for detox and then transferred to [facility] in [place]. States that she was discharged from [facility] in [facility]  after 3-4 days due to a confrontation with a female resident.   The patient is recommended for inpatient treatment for mood stabilization and detox. She is under review and awaiting bed placement at [facility], per [place], [place] [facility]. Labs reviewed, patient is positive for opiates and cocaine. No BAL obtained on arrival. Vital signs reviewed. Medications reviewed and patient reports tolerating medications well and denies any medication side effects.   Patient is accepted to [facility] room 303-1, tonight after 8:00 pm,  Per [place], [place].   Diagnosis:  Final diagnoses:  Substance induced mood disorder (HCC)  Cocaine use disorder, severe, dependence (HCC)  Alcohol use disorder, moderate, dependence (HCC)    Total Time spent with patient: 30 minutes  Past Psychiatric History: Alcohol use disorder, cocaine use disorder, and Bipolar 1.  Past  Medical History:  Past Medical History:  Diagnosis Date  . Anxiety   . Bipolar 1 disorder (HCC)   . Depression   . History of alcohol abuse   . History of substance abuse (HCC)    History reviewed. No pertinent surgical history. Family History:  Family History  Problem Relation Age of Onset  . Alcohol abuse Mother   . Bipolar disorder Mother   . Depression Mother   . Alcohol abuse Father   . Hyperlipidemia Father   . Hypertension Father   . Alcohol abuse Maternal Grandmother   . Bipolar disorder Maternal Grandmother   . Alcohol abuse Maternal Grandfather    Family Psychiatric  History: Unknown  Social History:  Social History   Substance and Sexual Activity  Alcohol Use Not Currently     Social History   Substance and Sexual Activity  Drug Use Not Currently    Social History   Socioeconomic History  . Marital status: Married    Spouse name: Not on file  . Number of children: Not on file  . Years of education: Not on file  . Highest education level: Not on file  Occupational History  . Not on file  Tobacco Use  . Smoking status: Current Every Day Smoker    Types: Cigarettes  . Smokeless tobacco: Never Used  Substance and Sexual Activity  . Alcohol use: Not Currently  . Drug use: Not Currently  . Sexual activity: Yes    Partners: Male    Birth control/protection: Injection, Condom  Other Topics Concern  . Not on file  Social  History Narrative  . Not on file   Social Determinants of Health   Financial Resource Strain:   . Difficulty of Paying Living Expenses: Not on file  Food Insecurity:   . Worried About Programme researcher, broadcasting/film/video in the Last Year: Not on file  . Ran Out of Food in the Last Year: Not on file  Transportation Needs:   . Lack of Transportation (Medical): Not on file  . Lack of Transportation (Non-Medical): Not on file  Physical Activity:   . Days of Exercise per Week: Not on file  . Minutes of Exercise per Session: Not on file  Stress:    . Feeling of Stress : Not on file  Social Connections:   . Frequency of Communication with Friends and Family: Not on file  . Frequency of Social Gatherings with Friends and Family: Not on file  . Attends Religious Services: Not on file  . Active Member of Clubs or Organizations: Not on file  . Attends Banker Meetings: Not on file  . Marital Status: Not on file   SDOH:  SDOH Screenings   Alcohol Screen:   . Last Alcohol Screening Score (AUDIT): Not on file  Depression (PHQ2-9): Medium Risk  . PHQ-2 Score: 13  Financial Resource Strain:   . Difficulty of Paying Living Expenses: Not on file  Food Insecurity:   . Worried About Programme researcher, broadcasting/film/video in the Last Year: Not on file  . Ran Out of Food in the Last Year: Not on file  Housing:   . Last Housing Risk Score: Not on file  Physical Activity:   . Days of Exercise per Week: Not on file  . Minutes of Exercise per Session: Not on file  Social Connections:   . Frequency of Communication with Friends and Family: Not on file  . Frequency of Social Gatherings with Friends and Family: Not on file  . Attends Religious Services: Not on file  . Active Member of Clubs or Organizations: Not on file  . Attends Banker Meetings: Not on file  . Marital Status: Not on file  Stress:   . Feeling of Stress : Not on file  Tobacco Use: High Risk  . Smoking Tobacco Use: Current Every Day Smoker  . Smokeless Tobacco Use: Never Used  Transportation Needs:   . Freight forwarder (Medical): Not on file  . Lack of Transportation (Non-Medical): Not on file   Additional Social History:    Pain Medications: see MAR Prescriptions: see MAR Over the Counter: see MAR History of alcohol / drug use?: Yes Longest period of sobriety (when/how long): Patient states that she was clean for almost 1 year Negative Consequences of Use: Financial, Personal relationships, Work / School Withdrawal Symptoms: Sweats, Blackouts, Nausea  / Vomiting, Fever / Chills, Tremors, Diarrhea Name of Substance 1: Crack Cocaine 1 - Age of First Use: 34 y.o 1 - Amount (size/oz): Depends 1 - Frequency: Daily 1 - Duration:  (UTA) 1 - Last Use / Amount: Today 01/13/20 Name of Substance 2: Alcohol 2 - Age of First Use: 34 y.o 2 - Amount (size/oz): Depends 2 - Frequency: Daily 2 - Duration:  (UTA) 2 - Last Use / Amount: Today 01/13/20                Sleep: Poor  Appetite:  Poor  Current Medications:  Current Facility-Administered Medications  Medication Dose Route Frequency Provider Last Rate Last Admin  . alum & mag hydroxide-simeth (  MAALOX/MYLANTA) 200-200-20 MG/5ML suspension 30 mL  30 mL Oral Q4H PRN Nira Conn A, NP      . gabapentin (NEURONTIN) tablet 600 mg  600 mg Oral TID Nira Conn A, NP   600 mg at 01/14/20 0930  . hydrOXYzine (ATARAX/VISTARIL) tablet 25 mg  25 mg Oral TID PRN Jackelyn Poling, NP      . loperamide (IMODIUM) capsule 2-4 mg  2-4 mg Oral PRN Nira Conn A, NP      . LORazepam (ATIVAN) tablet 1 mg  1 mg Oral Q6H PRN Nira Conn A, NP      . magnesium hydroxide (MILK OF MAGNESIA) suspension 30 mL  30 mL Oral Daily PRN Nira Conn A, NP      . multivitamin with minerals tablet 1 tablet  1 tablet Oral Daily Nira Conn A, NP   1 tablet at 01/14/20 0930  . OLANZapine zydis (ZYPREXA) disintegrating tablet 5 mg  5 mg Oral BID Syan Cullimore, Gerlene Burdock, FNP   5 mg at 01/14/20 0930  . ondansetron (ZOFRAN-ODT) disintegrating tablet 4 mg  4 mg Oral Q6H PRN Nira Conn A, NP   4 mg at 01/14/20 0006  . thiamine tablet 100 mg  100 mg Oral Daily Nira Conn A, NP   100 mg at 01/14/20 0930   Current Outpatient Medications  Medication Sig Dispense Refill  . ARIPiprazole (ABILIFY) 2 MG tablet Take 2 mg by mouth daily.    . busPIRone (BUSPAR) 7.5 MG tablet Take 7.5 mg by mouth 2 (two) times daily.    Marland Kitchen gabapentin (NEURONTIN) 300 MG capsule Take 300 mg by mouth 3 (three) times daily.    . hydrOXYzine (ATARAX/VISTARIL)  50 MG tablet Take 50 mg by mouth every 8 (eight) hours as needed for anxiety.     . naltrexone (DEPADE) 50 MG tablet Take 50 mg by mouth daily.    . naproxen (NAPROSYN) 500 MG tablet Take 500 mg by mouth 2 (two) times daily.    . prazosin (MINIPRESS) 1 MG capsule Take 1 mg by mouth at bedtime.    . topiramate (TOPAMAX) 25 MG tablet Take 25 mg by mouth 2 (two) times daily.    Marland Kitchen venlafaxine (EFFEXOR) 75 MG tablet Take 75 mg by mouth 2 (two) times daily.      Labs  Lab Results:  Admission on 01/13/2020  Component Date Value Ref Range Status  . SARS Coronavirus 2 by RT PCR 01/13/2020 NEGATIVE  NEGATIVE Final   Comment: (NOTE) SARS-CoV-2 target nucleic acids are NOT DETECTED.  The SARS-CoV-2 RNA is generally detectable in upper respiratoy specimens during the acute phase of infection. The lowest concentration of SARS-CoV-2 viral copies this assay can detect is 131 copies/mL. A negative result does not preclude SARS-Cov-2 infection and should not be used as the sole basis for treatment or other patient management decisions. A negative result may occur with  improper specimen collection/handling, submission of specimen other than nasopharyngeal swab, presence of viral mutation(s) within the areas targeted by this assay, and inadequate number of viral copies (<131 copies/mL). A negative result must be combined with clinical observations, patient history, and epidemiological information. The expected result is Negative.  Fact Sheet for Patients:  https://www.moore.com/  Fact Sheet for Healthcare Providers:  https://www.young.biz/  This test is no                          t yet approved or cleared by the [place]  [place] FDA and  has been authorized for detection and/or diagnosis of SARS-CoV-2 by FDA under an Emergency Use Authorization (EUA). This EUA will remain  in effect (meaning this test can be used) for the duration of the COVID-19 declaration  under Section 564(b)(1) of the Act, 21 U.S.C. section 360bbb-3(b)(1), unless the authorization is terminated or revoked sooner.    . Influenza A by PCR 01/13/2020 NEGATIVE  NEGATIVE Final  . Influenza B by PCR 01/13/2020 NEGATIVE  NEGATIVE Final   Comment: (NOTE) The Xpert Xpress SARS-CoV-2/FLU/RSV assay is intended as an aid in  the diagnosis of influenza from Nasopharyngeal swab specimens and  should not be used as a sole basis for treatment. Nasal washings and  aspirates are unacceptable for Xpert Xpress SARS-CoV-2/FLU/RSV  testing.  Fact Sheet for Patients: https://www.moore.com/  Fact Sheet for Healthcare Providers: https://www.young.biz/  This test is not yet approved or cleared by the [place] FDA and  has been authorized for detection and/or diagnosis of SARS-CoV-2 by  FDA under an Emergency Use Authorization (EUA). This EUA will remain  in effect (meaning this test can be used) for the duration of the  Covid-19 declaration under Section 564(b)(1) of the Act, 21  U.S.C. section 360bbb-3(b)(1), unless the authorization is  terminated or revoked. Performed at [facility], [address] [place], [place] [address]   . SARS Coronavirus 2 Ag 01/13/2020 Negative  Negative Preliminary  . WBC 01/13/2020 7.0  4.0 - 10.5 K/uL Final  . RBC 01/13/2020 4.47  3.87 - 5.11 MIL/uL Final  . Hemoglobin 01/13/2020 14.6  12.0 - 15.0 g/dL Final  . HCT 19/14/7829 43.7  36 - 46 % Final  . MCV 01/13/2020 97.8  80.0 - 100.0 fL Final  . MCH 01/13/2020 32.7  26.0 - 34.0 pg Final  . MCHC 01/13/2020 33.4  30.0 - 36.0 g/dL Final  . RDW 56/21/3086 13.2  11.5 - 15.5 % Final  . Platelets 01/13/2020 307  150 - 400 K/uL Final  . nRBC 01/13/2020 0.0  0.0 - 0.2 % Final  . Neutrophils Relative % 01/13/2020 23  % Final  . Neutro Abs 01/13/2020 1.6* 1.7 - 7.7 K/uL Final  . Lymphocytes Relative 01/13/2020 69  % Final  . Lymphs Abs 01/13/2020 4.8* 0.7 - 4.0  K/uL Final  . Monocytes Relative 01/13/2020 6  % Final  . Monocytes Absolute 01/13/2020 0.4  0.1 - 1.0 K/uL Final  . Eosinophils Relative 01/13/2020 1  % Final  . Eosinophils Absolute 01/13/2020 0.1  0.0 - 0.5 K/uL Final  . Basophils Relative 01/13/2020 1  % Final  . Basophils Absolute 01/13/2020 0.1  0.0 - 0.1 K/uL Final  . Immature Granulocytes 01/13/2020 0  % Final  . Abs Immature Granulocytes 01/13/2020 0.01  0.00 - 0.07 K/uL Final   Performed at [facility], [address] [place], [place] [address]  . Sodium 01/13/2020 143  135 - 145 mmol/L Final  . Potassium 01/13/2020 4.1  3.5 - 5.1 mmol/L Final  . Chloride 01/13/2020 106  98 - 111 mmol/L Final  . CO2 01/13/2020 24  22 - 32 mmol/L Final  . Glucose, Bld 01/13/2020 78  70 - 99 mg/dL Final   Glucose reference range applies only to samples taken after fasting for at least 8 hours.  . BUN 01/13/2020 6  6 - 20 mg/dL Final  . Creatinine, Ser 01/13/2020 1.12* 0.44 - 1.00 mg/dL Final  . Calcium 96/29/5284 9.5  8.9 -  10.3 mg/dL Final  . Total Protein 01/13/2020 7.6  6.5 - 8.1 g/dL Final  . Albumin 16/10/960410/14/2021 4.5  3.5 - 5.0 g/dL Final  . AST 54/09/811910/14/2021 36  15 - 41 U/L Final  . ALT 01/13/2020 31  0 - 44 U/L Final  . Alkaline Phosphatase 01/13/2020 85  38 - 126 U/L Final  . Total Bilirubin 01/13/2020 1.1  0.3 - 1.2 mg/dL Final  . GFR, Estimated 01/13/2020 >60  >60 mL/min Final  . Anion gap 01/13/2020 13  5 - 15 Final   Performed at [facility], [address] [place], [place] [address]  . Cholesterol 01/13/2020 161  0 - 200 mg/dL Final  . Triglycerides 01/13/2020 87  <150 mg/dL Final  . HDL 95/62/130810/14/2021 70  >40 mg/dL Final  . Total CHOL/HDL Ratio 01/13/2020 2.3  RATIO Final  . VLDL 01/13/2020 17  0 - 40 mg/dL Final  . LDL Cholesterol 01/13/2020 74  0 - 99 mg/dL Final   Comment:        Total Cholesterol/HDL:CHD Risk Coronary Heart Disease Risk Table                     Men   Women  1/2 Average Risk   3.4   3.3  Average  Risk       5.0   4.4  2 X Average Risk   9.6   7.1  3 X Average Risk  23.4   11.0        Use the calculated Patient Ratio above and the CHD Risk Table to determine the patient's CHD Risk.        ATP III CLASSIFICATION (LDL):  <100     mg/dL   Optimal  [phone]  mg/dL   Near or Above                    Optimal  130-159  mg/dL   Borderline  [phone]  mg/dL   High  >841>190     mg/dL   Very High Performed at [facility], [address] [place], [place] [address]   . TSH 01/13/2020 0.995  0.350 - 4.500 uIU/mL Final   Comment: Performed by a 3rd Generation assay with a functional sensitivity of <=0.01 uIU/mL. Performed at [facility], [address] [place], [place] [address]   . POC Amphetamine UR 01/13/2020 None Detected  None Detected Preliminary  . POC Secobarbital (BAR) 01/13/2020 None Detected  None Detected Preliminary  . POC Buprenorphine (BUP) 01/13/2020 None Detected  None Detected Preliminary  . POC Oxazepam (BZO) 01/13/2020 None Detected  None Detected Preliminary  . POC Cocaine UR 01/13/2020 Positive* None Detected Preliminary  . POC Methamphetamine UR 01/13/2020 None Detected  None Detected Preliminary  . POC Morphine 01/13/2020 None Detected  None Detected Preliminary  . POC Oxycodone UR 01/13/2020 Positive* None Detected Preliminary  . POC Methadone UR 01/13/2020 None Detected  None Detected Preliminary  . POC Marijuana UR 01/13/2020 None Detected  None Detected Preliminary  . Preg Test, Ur 01/13/2020 NEGATIVE  NEGATIVE Final   Comment:        THE SENSITIVITY OF THIS METHODOLOGY IS >24 mIU/mL   . SARS Coronavirus 2 Ag 01/13/2020 NEGATIVE  NEGATIVE Final   Comment: (NOTE) SARS-CoV-2 antigen NOT DETECTED.   Negative results are presumptive.  Negative results do not preclude SARS-CoV-2 infection and should not be used as the sole basis for treatment or  other patient management decisions, including infection  control decisions, particularly in the presence  of clinical signs and  symptoms consistent with COVID-19, or in those who have been in contact with the virus.  Negative results must be combined with clinical observations, patient history, and epidemiological information. The expected result is Negative.  Fact Sheet for Patients: https://sanders-williams.net/  Fact Sheet for Healthcare Providers: https://martinez.com/   This test is not yet approved or cleared by the [place] FDA and  has been authorized for detection and/or diagnosis of SARS-CoV-2 by FDA under an Emergency Use Authorization (EUA).  This EUA will remain in effect (meaning this test can be used) for the duration of  the C                          OVID-19 declaration under Section 564(b)(1) of the Act, 21 U.S.C. section 360bbb-3(b)(1), unless the authorization is terminated or revoked sooner.    Office Visit on 11/18/2019  Component Date Value Ref Range Status  . Preg Test, Ur 11/18/2019 Negative  Negative Final  Admission on 09/14/2019, Discharged on 09/15/2019  Component Date Value Ref Range Status  . Sodium 09/14/2019 144  135 - 145 mmol/L Final  . Potassium 09/14/2019 3.6  3.5 - 5.1 mmol/L Final  . Chloride 09/14/2019 114* 98 - 111 mmol/L Final  . CO2 09/14/2019 20* 22 - 32 mmol/L Final  . Glucose, Bld 09/14/2019 91  70 - 99 mg/dL Final   Glucose reference range applies only to samples taken after fasting for at least 8 hours.  . BUN 09/14/2019 12  6 - 20 mg/dL Final  . Creatinine, Ser 09/14/2019 0.78  0.44 - 1.00 mg/dL Final  . Calcium 63/14/9702 8.7* 8.9 - 10.3 mg/dL Final  . Total Protein 09/14/2019 7.2  6.5 - 8.1 g/dL Final  . Albumin 63/78/5885 4.3  3.5 - 5.0 g/dL Final  . AST 02/77/4128 29  15 - 41 U/L Final  . ALT 09/14/2019 44  0 - 44 U/L Final  . Alkaline Phosphatase 09/14/2019 65  38 - 126 U/L Final  . Total Bilirubin 09/14/2019 0.7  0.3 - 1.2 mg/dL Final  . GFR calc non Af Amer 09/14/2019 >60  >60 mL/min  Final  . GFR calc Af Amer 09/14/2019 >60  >60 mL/min Final  . Anion gap 09/14/2019 10  5 - 15 Final   Performed at [facility], [address] [place], [place] [address]  . Alcohol, Ethyl (B) 09/14/2019 142* <10 mg/dL Final   Comment: (NOTE) Lowest detectable limit for serum alcohol is 10 mg/dL.  For medical purposes only. Performed at [facility], [address] [place], [place] [address]   . Salicylate Lvl 09/14/2019 <7.0* 7.0 - 30.0 mg/dL Final   Performed at [facility], [address] [place], [place] [address]  . Acetaminophen (Tylenol), Serum 09/14/2019 <10* 10 - 30 ug/mL Final   Comment: (NOTE) Therapeutic concentrations vary significantly. A range of 10-30 ug/mL  may be an effective concentration for many patients. However, some  are best treated at concentrations outside of this range. Acetaminophen concentrations >150 ug/mL at 4 hours after ingestion  and >50 ug/mL at 12 hours after ingestion are often associated with  toxic reactions.  Performed at [facility], [address], [place], [place] [address]   . WBC 09/14/2019 7.7  4.0 - 10.5 K/uL Final  . RBC 09/14/2019 3.77* 3.87 -  5.11 MIL/uL Final  . Hemoglobin 09/14/2019 12.2  12.0 - 15.0 g/dL Final  . HCT 16/12/9602 35.9* 36 - 46 % Final  . MCV 09/14/2019 95.2  80.0 - 100.0 fL Final  . MCH 09/14/2019 32.4  26.0 - 34.0 pg Final  . MCHC 09/14/2019 34.0  30.0 - 36.0 g/dL Final  . RDW 54/11/8117 13.7  11.5 - 15.5 % Final  . Platelets 09/14/2019 272  150 - 400 K/uL Final  . nRBC 09/14/2019 0.0  0.0 - 0.2 % Final   Performed at [facility], [address], [place], [place] [address]  . Opiates 09/14/2019 NONE DETECTED  NONE DETECTED Final  . Cocaine 09/14/2019 NONE DETECTED  NONE DETECTED Final  . Benzodiazepines 09/14/2019 NONE DETECTED  NONE DETECTED Final  . Amphetamines 09/14/2019 NONE DETECTED  NONE DETECTED Final   . Tetrahydrocannabinol 09/14/2019 NONE DETECTED  NONE DETECTED Final  . Barbiturates 09/14/2019 NONE DETECTED  NONE DETECTED Final   Comment: (NOTE) DRUG SCREEN FOR MEDICAL PURPOSES ONLY.  IF CONFIRMATION IS NEEDED FOR ANY PURPOSE, NOTIFY LAB WITHIN 5 DAYS.  LOWEST DETECTABLE LIMITS FOR URINE DRUG SCREEN Drug Class                     Cutoff (ng/mL) Amphetamine and metabolites    1000 Barbiturate and metabolites    200 Benzodiazepine                 200 Tricyclics and metabolites     300 Opiates and metabolites        300 Cocaine and metabolites        300 THC                            50 Performed at [facility], [address], [place], [place] [address]   . I-stat hCG, quantitative 09/14/2019 <5.0  <5 mIU/mL Final  . Comment 3 09/14/2019          Final   Comment:   GEST. AGE      CONC.  (mIU/mL)   <=1 WEEK        5 - 50     2 WEEKS       50 - 500     3 WEEKS       100 - 10,000     4 WEEKS     1,000 - 30,000        FEMALE AND NON-PREGNANT FEMALE:     LESS THAN 5 mIU/mL   Orders Only on 08/05/2019  Component Date Value Ref Range Status  . specimen status report 08/05/2019 Comment   Final   Comment: Ambiguous Test Order Ambiguous Test Order Report delayed in order to contact you to clarify requested test(s). CONTACTED DAVID SCHEME AT YOUR FACILITY ON 08/06/19. REQUEST: SPECIMEN TO BE SENT BACK TO ACCOUNT   Office Visit on 08/05/2019  Component Date Value Ref Range Status  . RPR Ser Ql 08/05/2019 Non Reactive  Non Reactive Final  . HIV Screen 4th Generation wRfx 08/05/2019 Non Reactive  Non Reactive Final  . Urine Culture, Routine 08/05/2019 Final report   Final  . Organism ID, Bacteria 08/05/2019 Comment   Final   Comment: Mixed urogenital flora Less than 10,000 colonies/mL   . HSV 1 Glycoprotein G Ab, IgG 08/05/2019 46.50* 0.00 - 0.90 index Final   Comment:  Negative        <0.91                                   Equivocal 0.91 - 1.09                                  Positive        >1.09  Note: Negative indicates no antibodies detected to  HSV-1. Equivocal may suggest early infection.  If  clinically appropriate, retest at later date. Positive  indicates antibodies detected to HSV-1.   . HSV 2 IgG, Type Spec 08/05/2019 6.78* 0.00 - 0.90 index Final   Comment:                                  Negative        <0.91                                  Equivocal 0.91 - 1.09                                  Positive        >1.09  Note: Negative indicates no antibodies detected to  HSV-2. Equivocal may suggest early infection.  If  clinically appropriate, retest at later date. Positive  indicates antibodies detected to HSV-2.   . HSV 1 IgM 08/05/2019 <1:10  <1:10 titer Final  . HSV 2 IgM 08/05/2019 <1:10  <1:10 titer Final   Comment: HSV 1 and HSV 2 share many cross-reacting antigens. Elevated titers to both HSV 1 and HSV 2 may represent crossreactive HSV antibodies rather than exposure to both HSV 1 and HSV 2. Results for this test are for research purposes only by the assay's manufacturer. The performance characteristics of this product have not been established.  Results should not be used as a diagnostic procedure without confirmation of the diagnosis by another medically established diagnostic product or procedure.   . Preg Test, Ur 08/05/2019 Negative  Negative Final  . Neisseria Gonorrhea 08/05/2019 Negative   Final  . Chlamydia 08/05/2019 Negative   Final  . Trichomonas 08/05/2019 Positive*  Final  . Bacterial Vaginitis (gardnerella) 08/05/2019 Positive*  Final  . Candida Vaginitis 08/05/2019 Negative   Final  . Candida Glabrata 08/05/2019 Negative   Final  . Comment 08/05/2019 Normal Reference Range Bacterial Vaginosis - Negative   Final  . Comment 08/05/2019 Normal Reference Range Candida Species - Negative   Final  . Comment 08/05/2019 Normal Reference Range Candida Galbrata - Negative    Final  . Comment 08/05/2019 Normal Reference Range Trichomonas - Negative   Final  . Comment 08/05/2019 Normal Reference Ranger Chlamydia - Negative   Final  . Comment 08/05/2019 Normal Reference Range Neisseria Gonorrhea - Negative   Final  . Color, UA 08/05/2019 yellow  yellow Final  . Clarity, UA 08/05/2019 clear  clear Final  . Glucose, UA 08/05/2019 negative  negative mg/dL Final  . Bilirubin, UA 08/05/2019 negative  negative Final  . Ketones, POC UA 08/05/2019 negative  negative mg/dL Final  . Spec Grav, UA 08/05/2019 1.025  1.010 - 1.025 Final  .  Blood, UA 08/05/2019 trace-intact* negative Final  . pH, UA 08/05/2019 7.0  5.0 - 8.0 Final  . POC PROTEIN,UA 08/05/2019 negative  negative, trace Final  . Urobilinogen, UA 08/05/2019 0.2  0.2 or 1.0 E.U./dL Final  . Nitrite, UA 70/78/6754 Negative  Negative Final  . Leukocytes, UA 08/05/2019 Moderate (2+)* Negative Final    Blood Alcohol level:  Lab Results  Component Value Date   ETH 142 (H) 09/14/2019    Metabolic Disorder Labs: No results found for: HGBA1C, MPG No results found for: PROLACTIN Lab Results  Component Value Date   CHOL 161 01/13/2020   TRIG 87 01/13/2020   HDL 70 01/13/2020   CHOLHDL 2.3 01/13/2020   VLDL 17 01/13/2020   LDLCALC 74 01/13/2020    Therapeutic Lab Levels: No results found for: LITHIUM No results found for: VALPROATE No components found for:  CBMZ  Physical Findings   PHQ2-9     Office Visit from 11/18/2019 in [facility] Office Visit from 06/16/2019 in [facility]  PHQ-2 Total Score 4 5  PHQ-9 Total Score 13 13       Musculoskeletal  Strength & Muscle Tone: within normal limits Gait & Station: normal Patient leans: N/A  Psychiatric Specialty Exam  Presentation  General Appearance: Disheveled  Eye Contact:Fair  Speech:Clear and Coherent  Speech Volume:Normal  Handedness:Right   Mood and Affect   Mood:Depressed  Affect:Congruent   Thought Process  Thought Processes:Coherent  Descriptions of Associations:Intact  Orientation:Full (Time, Place and Person)  Thought Content:Logical  Hallucinations:Hallucinations: Auditory;Visual Description of Command Hallucinations: "voices sound demonic" Description of Auditory Hallucinations: "see snakes and monsters" Description of Visual Hallucinations: states that she sees demons and snakes  Ideas of Reference:None  Suicidal Thoughts:Suicidal Thoughts: Yes, Passive  Homicidal Thoughts:Homicidal Thoughts: No HI Active Intent and/or Plan: With Intent;With Plan;With Means to Carry Out   Sensorium  Memory:Immediate Fair;Remote Fair;Recent Fair  Judgment:Fair  Insight:Fair   Executive Functions  Concentration:Fair  Attention Span:Fair  Recall:Fair  Fund of Knowledge:Fair  Language:Fair   Psychomotor Activity  Psychomotor Activity:Psychomotor Activity: Normal   Assets  Assets:Communication Skills;Desire for Improvement;Physical Health   Sleep  Sleep:Sleep: Poor   Physical Exam  Physical Exam Vitals and nursing note reviewed.  Constitutional:      Appearance: She is well-developed.  Eyes:     Pupils: Pupils are equal, round, and reactive to light.  Cardiovascular:     Rate and Rhythm: Normal rate.  Pulmonary:     Effort: Pulmonary effort is normal.  Musculoskeletal:        General: Normal range of motion.  Neurological:     Mental Status: She is alert and oriented to person, place, and time.    Review of Systems  Constitutional: Negative.   HENT: Negative.   Eyes: Negative.   Respiratory: Negative.   Cardiovascular: Negative.   Gastrointestinal: Negative.   Genitourinary: Negative.   Musculoskeletal: Negative.   Skin: Negative.   Neurological: Negative.   Endo/Heme/Allergies: Negative.   Psychiatric/Behavioral: Positive for depression, hallucinations, substance abuse and suicidal ideas.    Blood pressure (!) 112/54, pulse 92, temperature 98.2 F (36.8 C), temperature source Temporal, resp. rate 18, SpO2 97 %. There is no height or weight on file to calculate BMI.  Treatment Plan Summary: Daily contact with patient to assess and evaluate symptoms and progress in treatment and Medication management  Recommended inpatient treatment for mood stabilization and detox. Patient is awaiting bed placement at [facility].  Continue: Zyprexa 5 mg PO BID for psychosis, Neurontin 600 mg PO TID for alcohol use, and Ativan 1 mg prn for CIWA >10.  Patient is accepted to [facility] room 303-1, tonight after 8:00 pm, Per [place], AC.   Gerlene Burdock Bernardette Waldron, FNP 01/14/2020 3:38 PM

## 2020-01-14 NOTE — ED Notes (Signed)
Pt sleeping @ this time. Breathing even and unlabored. Will continue to monitor pt 

## 2020-01-14 NOTE — Progress Notes (Signed)
Pt's [facility] authorization number: [id number]  [facility] be good [facility] for [facility].    Wells Guiles, MSW, LCSW, LCAS Clinical Social Worker II Disposition CSW [phone]

## 2020-01-14 NOTE — Discharge Instructions (Addendum)
Discharge to [facility] 

## 2020-01-14 NOTE — ED Notes (Signed)
Pt sleeping at present, no distress noted, pending report & transfer to [facility], rm 303-1 after 8pm.  Monitoring for safety.

## 2020-01-14 NOTE — ED Notes (Signed)
Locker #[id number] 

## 2020-01-14 NOTE — ED Notes (Signed)
Report attempted at 7:44pm and 9:27pm.  [facility] Adult Unit states to return call.

## 2020-01-14 NOTE — Progress Notes (Signed)
Received Darlene Gates this AM asleep in his chair bed, she continued to sleep and was awaken for VS and breakfast. Later she received and was compliant with her medications. She stated not doing well this AM, but would not verbalized in details.
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
## 2020-01-15 ENCOUNTER — Telehealth (HOSPITAL_COMMUNITY): Payer: Medicaid Other | Admitting: Adult Health

## 2020-01-15 ENCOUNTER — Encounter (HOSPITAL_COMMUNITY): Payer: Self-pay | Admitting: Psychiatry

## 2020-01-15 ENCOUNTER — Other Ambulatory Visit: Payer: Self-pay

## 2020-01-15 DIAGNOSIS — F1029 Alcohol dependence with unspecified alcohol-induced disorder: Secondary | ICD-10-CM | POA: Diagnosis not present

## 2020-01-15 DIAGNOSIS — F314 Bipolar disorder, current episode depressed, severe, without psychotic features: Secondary | ICD-10-CM | POA: Diagnosis not present

## 2020-01-15 DIAGNOSIS — F1994 Other psychoactive substance use, unspecified with psychoactive substance-induced mood disorder: Secondary | ICD-10-CM | POA: Diagnosis present

## 2020-01-15 DIAGNOSIS — F3289 Other specified depressive episodes: Secondary | ICD-10-CM

## 2020-01-15 DIAGNOSIS — F411 Generalized anxiety disorder: Secondary | ICD-10-CM

## 2020-01-15 DIAGNOSIS — F1510 Other stimulant abuse, uncomplicated: Secondary | ICD-10-CM | POA: Diagnosis not present

## 2020-01-15 DIAGNOSIS — F1020 Alcohol dependence, uncomplicated: Secondary | ICD-10-CM

## 2020-01-15 DIAGNOSIS — F1110 Opioid abuse, uncomplicated: Secondary | ICD-10-CM | POA: Diagnosis present

## 2020-01-15 DIAGNOSIS — Z5900 Homelessness unspecified: Secondary | ICD-10-CM

## 2020-01-15 LAB — HEMOGLOBIN A1C
Hgb A1c MFr Bld: 5.5 % (ref 4.8–5.6)
Mean Plasma Glucose: 111 mg/dL

## 2020-01-15 MED ORDER — THIAMINE HCL 100 MG PO TABS
100.0000 mg | ORAL_TABLET | Freq: Every day | ORAL | Status: DC
  Administered 2020-01-16 – 2020-01-21 (×6): 100 mg via ORAL
  Filled 2020-01-15 (×8): qty 1

## 2020-01-15 MED ORDER — PNEUMOCOCCAL VAC POLYVALENT 25 MCG/0.5ML IJ INJ
0.5000 mL | INJECTION | INTRAMUSCULAR | Status: AC
  Administered 2020-01-17: 0.5 mL via INTRAMUSCULAR
  Filled 2020-01-15: qty 0.5

## 2020-01-15 MED ORDER — HYDROXYZINE HCL 25 MG PO TABS
25.0000 mg | ORAL_TABLET | Freq: Four times a day (QID) | ORAL | Status: AC | PRN
  Administered 2020-01-16 – 2020-01-17 (×2): 25 mg via ORAL
  Filled 2020-01-15: qty 1

## 2020-01-15 MED ORDER — INFLUENZA VAC SPLIT QUAD 0.5 ML IM SUSY
0.5000 mL | PREFILLED_SYRINGE | INTRAMUSCULAR | Status: AC
  Administered 2020-01-17: 0.5 mL via INTRAMUSCULAR
  Filled 2020-01-15: qty 0.5

## 2020-01-15 MED ORDER — LORAZEPAM 1 MG PO TABS
1.0000 mg | ORAL_TABLET | Freq: Four times a day (QID) | ORAL | Status: AC
  Administered 2020-01-15 – 2020-01-16 (×6): 1 mg via ORAL
  Filled 2020-01-15 (×5): qty 1

## 2020-01-15 MED ORDER — LORAZEPAM 1 MG PO TABS: 1.0000 mg | ORAL_TABLET | Freq: Four times a day (QID) | ORAL | Status: DC | PRN

## 2020-01-15 MED ORDER — MAGNESIUM HYDROXIDE 400 MG/5ML PO SUSP: 30.0000 mL | Freq: Every day | ORAL | Status: DC | PRN

## 2020-01-15 MED ORDER — GABAPENTIN 300 MG PO CAPS
600.0000 mg | ORAL_CAPSULE | Freq: Three times a day (TID) | ORAL | Status: DC
  Administered 2020-01-15 – 2020-01-16 (×5): 600 mg via ORAL
  Filled 2020-01-15 (×14): qty 2

## 2020-01-15 MED ORDER — HYDROXYZINE HCL 25 MG PO TABS
25.0000 mg | ORAL_TABLET | Freq: Three times a day (TID) | ORAL | Status: DC | PRN
  Administered 2020-01-18 – 2020-01-19 (×3): 25 mg via ORAL
  Filled 2020-01-15 (×2): qty 1
  Filled 2020-01-15: qty 10
  Filled 2020-01-15 (×2): qty 1

## 2020-01-15 MED ORDER — THIAMINE HCL 100 MG/ML IJ SOLN: 100.0000 mg | Freq: Once | INTRAMUSCULAR | Status: DC

## 2020-01-15 MED ORDER — LORAZEPAM 1 MG PO TABS
1.0000 mg | ORAL_TABLET | Freq: Every day | ORAL | Status: AC
  Administered 2020-01-19: 1 mg via ORAL
  Filled 2020-01-15: qty 1

## 2020-01-15 MED ORDER — ACETAMINOPHEN 325 MG PO TABS
650.0000 mg | ORAL_TABLET | Freq: Four times a day (QID) | ORAL | Status: DC | PRN
  Administered 2020-01-17 – 2020-01-19 (×3): 650 mg via ORAL
  Filled 2020-01-15 (×3): qty 2

## 2020-01-15 MED ORDER — LORAZEPAM 1 MG PO TABS
1.0000 mg | ORAL_TABLET | Freq: Two times a day (BID) | ORAL | Status: AC
  Administered 2020-01-17 – 2020-01-18 (×2): 1 mg via ORAL
  Filled 2020-01-15 (×3): qty 1

## 2020-01-15 MED ORDER — ADULT MULTIVITAMIN W/MINERALS CH
1.0000 | ORAL_TABLET | Freq: Every day | ORAL | Status: DC
  Administered 2020-01-15 – 2020-01-21 (×7): 1 via ORAL
  Filled 2020-01-15 (×9): qty 1

## 2020-01-15 MED ORDER — TRAZODONE HCL 50 MG PO TABS
50.0000 mg | ORAL_TABLET | Freq: Every evening | ORAL | Status: DC | PRN
  Administered 2020-01-16: 50 mg via ORAL
  Filled 2020-01-15: qty 1

## 2020-01-15 MED ORDER — ONDANSETRON 4 MG PO TBDP
4.0000 mg | ORAL_TABLET | Freq: Four times a day (QID) | ORAL | Status: AC | PRN
  Administered 2020-01-17: 4 mg via ORAL
  Filled 2020-01-15: qty 1

## 2020-01-15 MED ORDER — LOPERAMIDE HCL 2 MG PO CAPS: 2.0000 mg | ORAL_CAPSULE | ORAL | Status: AC | PRN

## 2020-01-15 MED ORDER — ALUM & MAG HYDROXIDE-SIMETH 200-200-20 MG/5ML PO SUSP: 30.0000 mL | ORAL | Status: DC | PRN

## 2020-01-15 MED ORDER — NICOTINE 21 MG/24HR TD PT24
21.0000 mg | MEDICATED_PATCH | Freq: Every day | TRANSDERMAL | Status: DC
  Administered 2020-01-15 – 2020-01-21 (×7): 21 mg via TRANSDERMAL
  Filled 2020-01-15 (×11): qty 1

## 2020-01-15 MED ORDER — OLANZAPINE 5 MG PO TABS
5.0000 mg | ORAL_TABLET | Freq: Two times a day (BID) | ORAL | Status: DC
  Administered 2020-01-15 – 2020-01-16 (×3): 5 mg via ORAL
  Filled 2020-01-15 (×7): qty 1

## 2020-01-15 MED ORDER — LORAZEPAM 1 MG PO TABS
1.0000 mg | ORAL_TABLET | Freq: Three times a day (TID) | ORAL | Status: AC
  Administered 2020-01-17 (×2): 1 mg via ORAL
  Filled 2020-01-15 (×2): qty 1
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
## 2020-01-15 NOTE — Progress Notes (Signed)
[facility] Group Notes:  (Nursing/MHT/Case Management/Adjunct)  Date:  01/15/2020  Time:  2:45 PM  Type of Therapy:  Nurse Education  Participation Level:  Minimal  Participation Quality:  Drowsy  Affect:  Flat  Cognitive:  Pt lethargic and sleepy  Insight:  Limited  Engagement in Group:  None  Modes of Intervention:  Activity, Discussion, Education, Socialization and Support  Summary of Progress/Problems:The purpose of this group is to introduce patients to the benefits and safety of aromatherapy. Pt was sleepy when she came to group. She attempted to stay awake but kept falling asleep. MHT escorted pt back to her room. She was placed on a fall risk for safety. Beatrix Shipper 01/15/2020, 2:45 PM

## 2020-01-15 NOTE — Progress Notes (Signed)
Patient is a 34 year old female admitted for si with a plan to get hit by a car. She reports she has had this plan for the past two weeks. She reports using crack cocaine and alcohol daily. She is currently homeless and living at a shelter. She reports that her 52 year old son is with her sister in [place] and her husband is in a shelter also. She has been off her medications for 1 month. She is positive for si/auditory and visual hallucinations. Patient was cooperative during admission. Skin assessment completed and patient oriented to unit. Safety maintained with 15 min checks.

## 2020-01-15 NOTE — Progress Notes (Signed)
°   01/15/20 1200  Psych Admission Type (Psych Patients Only)  Admission Status Voluntary  Psychosocial Assessment  Patient Complaints Anxiety  Eye Contact Brief  Facial Expression Flat;Anxious  Affect Depressed  Speech Logical/coherent  Interaction Assertive  Motor Activity Slow  Appearance/Hygiene Disheveled  Behavior Characteristics Agitated;Irritable  Mood Irritable;Labile  Thought Process  Coherency Circumstantial  Content WDL  Delusions None reported or observed  Perception Hallucinations  Hallucination Auditory (negative )  Judgment Poor  Confusion None  Danger to Self  Current suicidal ideation? Passive  Self-Injurious Behavior No self-injurious ideation or behavior indicators observed or expressed   Agreement Not to Harm Self Yes  Description of Agreement verbal contract  Danger to Others  Danger to Others None reported or observed   Pt reports passive si thoughts. She has been sleepy in her room much of the day. Pt placed on high fall risk for safety. She attempted to come to afternoon nurses group and fell asleep. Offered support, encouragement and 15 minute checks. Pt denies hi. She contracts for safety. Safety maintained on the unit.

## 2020-01-15 NOTE — Tx Team (Signed)
Initial Treatment Plan 01/15/2020 1:48 AM [name] IWO:[id number]    PATIENT STRESSORS: Financial difficulties Medication change or noncompliance Substance abuse   PATIENT STRENGTHS: Motivation for treatment/growth Supportive family/friends   PATIENT IDENTIFIED PROBLEMS: Substance abuse - crack cocaine and ETOH  Depression                    DISCHARGE CRITERIA:  Improved stabilization in mood, thinking, and/or behavior  PRELIMINARY DISCHARGE PLAN: Attend 12-step recovery group Outpatient therapy  PATIENT/FAMILY INVOLVEMENT: This treatment plan has been presented to and reviewed with the patient, Darlene Gates, and/or family member.  The patient and family have been given the opportunity to ask questions and make suggestions.  Floyce Stakes, RN 01/15/2020, 1:48 AM

## 2020-01-15 NOTE — Progress Notes (Signed)
Psychoeducational Group Note  Date:  01/15/2020 Time:  2037  Group Topic/Focus:  Wrap-Up Group:   The focus of this group is to help patients review their daily goal of treatment and discuss progress on daily workbooks.  Participation Level: Did Not Attend  Participation Quality:  Not Applicable  Affect:  Not Applicable  Cognitive:  Not Applicable  Insight:  Not Applicable  Engagement in Group: Not Applicable  Additional Comments:  Patient did not attend group this evening since she was asleep.   Hazle Coca S 01/15/2020, 8:37 PM
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
## 2020-01-15 NOTE — [facility] Counselor (Signed)
CSW faxed referrals to [facility], and [facility] for review for residential substance use program.   [facility] authorization [id number] and is valid from 01/14/20 to 01/20/20.   Ruthann Cancer MSW, LCSW Clincal Social Worker  [facility]

## 2020-01-16 DIAGNOSIS — F1029 Alcohol dependence with unspecified alcohol-induced disorder: Secondary | ICD-10-CM | POA: Diagnosis not present

## 2020-01-16 DIAGNOSIS — F314 Bipolar disorder, current episode depressed, severe, without psychotic features: Secondary | ICD-10-CM | POA: Diagnosis not present

## 2020-01-16 DIAGNOSIS — M79606 Pain in leg, unspecified: Secondary | ICD-10-CM

## 2020-01-16 DIAGNOSIS — M549 Dorsalgia, unspecified: Secondary | ICD-10-CM

## 2020-01-16 DIAGNOSIS — F1510 Other stimulant abuse, uncomplicated: Secondary | ICD-10-CM | POA: Diagnosis not present

## 2020-01-16 DIAGNOSIS — F3289 Other specified depressive episodes: Secondary | ICD-10-CM | POA: Diagnosis not present

## 2020-01-16 DIAGNOSIS — G4700 Insomnia, unspecified: Secondary | ICD-10-CM

## 2020-01-16 MED ORDER — NALTREXONE HCL 50 MG PO TABS
50.0000 mg | ORAL_TABLET | Freq: Every day | ORAL | Status: DC
  Administered 2020-01-16 – 2020-01-21 (×6): 50 mg via ORAL
  Filled 2020-01-16 (×6): qty 1
  Filled 2020-01-16: qty 7
  Filled 2020-01-16: qty 1

## 2020-01-16 MED ORDER — ARIPIPRAZOLE 5 MG PO TABS
5.0000 mg | ORAL_TABLET | Freq: Every day | ORAL | Status: DC
  Administered 2020-01-16 – 2020-01-18 (×3): 5 mg via ORAL
  Filled 2020-01-16 (×5): qty 1

## 2020-01-16 MED ORDER — MELOXICAM 7.5 MG PO TABS
7.5000 mg | ORAL_TABLET | Freq: Every day | ORAL | Status: DC
  Administered 2020-01-17 – 2020-01-21 (×5): 7.5 mg via ORAL
  Filled 2020-01-16: qty 1
  Filled 2020-01-16: qty 7
  Filled 2020-01-16 (×5): qty 1

## 2020-01-16 MED ORDER — ARIPIPRAZOLE ER 400 MG IM SRER
400.0000 mg | INTRAMUSCULAR | Status: DC
  Administered 2020-01-16: 400 mg via INTRAMUSCULAR

## 2020-01-16 NOTE — [facility] Group Notes (Addendum)
LCSW Group Therapy Note  01/16/2020   10:00-11:00am   Type of Therapy and Topic:  Group Therapy: Anger Cues and Responses  Participation Level:  Minimal / Active at times   Description of Group:   In this group, patients learned how to recognize the physical, cognitive, emotional, and behavioral responses they have to anger-provoking situations.  They identified a recent time they became angry and how they reacted.  They analyzed how their reaction was possibly beneficial and how it was possibly unhelpful.  The group discussed a variety of healthier coping skills that could help with such a situation in the future.  Focus was placed on how helpful it is to recognize the underlying emotions to our anger, because working on those can lead to a more permanent solution as well as our ability to focus on the important rather than the urgent.  Therapeutic Goals: 1. Patients will remember their last incident of anger and how they felt emotionally and physically, what their thoughts were at the time, and how they behaved. 2. Patients will identify how their behavior at that time worked for them, as well as how it worked against them. 3. Patients will explore possible new behaviors to use in future anger situations. 4. Patients will learn that anger itself is normal and cannot be eliminated, and that healthier reactions can assist with resolving conflict rather than worsening situations.  Summary of Patient Progress:  Pt was present throughout group but did appear to be dozing off. Pt shared she gets triggered when people keep pushing her buttons and shared her coping skills with group. Pt did share after people had left that the medications make her sleepy and she needs a schedule to make sure she comes to these groups or she will sleep through it. Pt also asked for crafts / coloring pages.   Therapeutic Modalities:   Cognitive Behavioral Therapy  Shellia Cleverly
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
## 2020-01-16 NOTE — Progress Notes (Signed)
Pt did not attend wrap-up group   

## 2020-01-16 NOTE — Progress Notes (Signed)
   01/16/20 0302  Psych Admission Type (Psych Patients Only)  Admission Status Voluntary  Psychosocial Assessment  Patient Complaints None  Eye Contact Brief  Facial Expression Flat  Affect Depressed  Speech Logical/coherent  Interaction Assertive  Motor Activity Slow  Appearance/Hygiene Disheveled  Behavior Characteristics Guarded  Mood Other (Comment)  Thought Process  Coherency Circumstantial  Content WDL  Delusions None reported or observed  Perception Hallucinations  Hallucination Auditory (negative )  Judgment Poor  Confusion None  Danger to Self  Current suicidal ideation? Passive  Self-Injurious Behavior No self-injurious ideation or behavior indicators observed or expressed   Agreement Not to Harm Self Yes  Description of Agreement verbal contract  Danger to Others  Danger to Others None reported or observed

## 2020-01-17 MED ORDER — ENSURE ENLIVE PO LIQD
237.0000 mL | Freq: Two times a day (BID) | ORAL | Status: DC
  Administered 2020-01-17 – 2020-01-18 (×3): 237 mL via ORAL

## 2020-01-17 MED ORDER — PRAZOSIN HCL 1 MG PO CAPS
1.0000 mg | ORAL_CAPSULE | Freq: Every day | ORAL | Status: DC
  Administered 2020-01-17 – 2020-01-20 (×4): 1 mg via ORAL
  Filled 2020-01-17 (×3): qty 1
  Filled 2020-01-17: qty 7
  Filled 2020-01-17 (×4): qty 1

## 2020-01-17 MED ORDER — PROPRANOLOL HCL 10 MG PO TABS
10.0000 mg | ORAL_TABLET | Freq: Two times a day (BID) | ORAL | Status: DC
  Administered 2020-01-17 – 2020-01-18 (×2): 10 mg via ORAL
  Filled 2020-01-17 (×7): qty 1

## 2020-01-17 NOTE — Progress Notes (Signed)
[facility] MD Progress Note  01/17/2020 11:41 AM [name]  MRN:  [id number] Subjective:  Darlene Gates is a 34 year old AA female who appears older than her stated age. She has a past history of Bipolar 1 disorder, current episode depressed, GAD, Substance induced mood disorder, Cocaine use disorder, and opiate use disorder, Alcohol dependence, and homelessness. She was a walk-in at [facility] asking for help for her Bipolar and substance abuse.   Patient seen. Chart reviewed. Patient was seen in treatment team initially this morning and mildly irritable regarding the fact that she was still waiting for her pneumonia and influenza vaccines. She received vaccines this morning and is calmer on assessment. She reports depressed mood this morning related to substance use and homelessness. She is particularly worried that her belongings will be removed from her storage facility while she is in the hospital. Patient has phone number for the facility and was encouraged to call. She denies SI. She denies current AVH but reports AH earlier this morning making negative comments about her. She shows no signs of responding to internal stimuli. She denies withdrawal symptoms. She is hopeful for rehab at discharge, and CSW has made appropriate referrals.   Patient observed to have restless legs on assessment, which she reports started since she has been in the hospital and disrupted her sleep last night. She received Abilify Maintena yesterday and has been taken PO Abilify at HS. We discussed changing Abilify to morning admin time, but patient reports side effect of sleepiness with this medication and prefers to continue at HS.    Principal Problem: Bipolar 1 disorder, depressed, severe (HCC) Diagnosis: Principal Problem:   Bipolar 1 disorder, depressed, severe (HCC) Active Problems:   Depression   Generalized anxiety disorder   Homelessness   Substance induced mood disorder (HCC)   Amphetamine abuse (HCC)    Alcohol dependence (HCC)   Opiate abuse, episodic (HCC)  Total Time spent with patient: 15 minutes  Past Psychiatric History: See admission H&P  Past Medical History:  Past Medical History:  Diagnosis Date  . Anxiety   . Bipolar 1 disorder (HCC)   . Depression   . History of alcohol abuse   . History of substance abuse (HCC)    History reviewed. No pertinent surgical history. Family History:  Family History  Problem Relation Age of Onset  . Alcohol abuse Mother   . Bipolar disorder Mother   . Depression Mother   . Alcohol abuse Father   . Hyperlipidemia Father   . Hypertension Father   . Alcohol abuse Maternal Grandmother   . Bipolar disorder Maternal Grandmother   . Alcohol abuse Maternal Grandfather    Family Psychiatric  History: See admission H&P Social History:  Social History   Substance and Sexual Activity  Alcohol Use Yes  . Alcohol/week: 4.0 standard drinks  . Types: 1 Shots of liquor, 3 Cans of beer per week   Comment: pt reports she drincks daily     Social History   Substance and Sexual Activity  Drug Use Not on file   Comment: pt reports she uses daily    Social History   Socioeconomic History  . Marital status: Married    Spouse name: Not on file  . Number of children: Not on file  . Years of education: Not on file  . Highest education level: Not on file  Occupational History  . Not on file  Tobacco Use  . Smoking status: Current Every Day Smoker  Packs/day: 0.50    Types: Cigarettes  . Smokeless tobacco: Never Used  Vaping Use  . Vaping Use: Never used  Substance and Sexual Activity  . Alcohol use: Yes    Alcohol/week: 4.0 standard drinks    Types: 1 Shots of liquor, 3 Cans of beer per week    Comment: pt reports she drincks daily  . Drug use: Not on file    Comment: pt reports she uses daily  . Sexual activity: Yes    Partners: Male    Birth control/protection: Injection, Condom  Other Topics Concern  . Not on file  Social  History Narrative  . Not on file   Social Determinants of Health   Financial Resource Strain:   . Difficulty of Paying Living Expenses: Not on file  Food Insecurity:   . Worried About Programme researcher, broadcasting/film/videounning Out of Food in the Last Year: Not on file  . Ran Out of Food in the Last Year: Not on file  Transportation Needs:   . Lack of Transportation (Medical): Not on file  . Lack of Transportation (Non-Medical): Not on file  Physical Activity:   . Days of Exercise per Week: Not on file  . Minutes of Exercise per Session: Not on file  Stress:   . Feeling of Stress : Not on file  Social Connections:   . Frequency of Communication with Friends and Family: Not on file  . Frequency of Social Gatherings with Friends and Family: Not on file  . Attends Religious Services: Not on file  . Active Member of Clubs or Organizations: Not on file  . Attends BankerClub or Organization Meetings: Not on file  . Marital Status: Not on file   Additional Social History:                         Sleep: Good  Appetite:  Good  Current Medications: Current Facility-Administered Medications  Medication Dose Route Frequency Provider Last Rate Last Admin  . acetaminophen (TYLENOL) tablet 650 mg  650 mg Oral Q6H PRN Laveda AbbeParks, Laurie Britton, NP      . alum & mag hydroxide-simeth (MAALOX/MYLANTA) 200-200-20 MG/5ML suspension 30 mL  30 mL Oral Q4H PRN Patrcia Dollyate, Tina L, FNP      . ARIPiprazole (ABILIFY) tablet 5 mg  5 mg Oral QHS Laveda AbbeParks, Laurie Britton, NP   5 mg at 01/16/20 2111  . ARIPiprazole ER (ABILIFY MAINTENA) injection 400 mg  400 mg Intramuscular Q28 days Laveda AbbeParks, Laurie Britton, NP   400 mg at 01/16/20 1650  . feeding supplement (ENSURE ENLIVE / ENSURE PLUS) liquid 237 mL  237 mL Oral BID BM Antonieta Pertlary, Greg Lawson, MD   237 mL at 01/17/20 1117  . hydrOXYzine (ATARAX/VISTARIL) tablet 25 mg  25 mg Oral Q6H PRN Patrcia Dollyate, Tina L, FNP   25 mg at 01/16/20 2113  . [START ON 01/18/2020] hydrOXYzine (ATARAX/VISTARIL) tablet 25 mg  25 mg  Oral TID PRN Patrcia Dollyate, Tina L, FNP      . loperamide (IMODIUM) capsule 2-4 mg  2-4 mg Oral PRN Patrcia Dollyate, Tina L, FNP      . LORazepam (ATIVAN) tablet 1 mg  1 mg Oral TID Patrcia Dollyate, Tina L, FNP   1 mg at 01/17/20 1059   Followed by  . LORazepam (ATIVAN) tablet 1 mg  1 mg Oral BID Patrcia Dollyate, Tina L, FNP       Followed by  . [START ON 01/19/2020] LORazepam (ATIVAN) tablet 1 mg  1 mg  Oral Daily Patrcia Dolly, FNP      . magnesium hydroxide (MILK OF MAGNESIA) suspension 30 mL  30 mL Oral Daily PRN Patrcia Dolly, FNP      . meloxicam (MOBIC) tablet 7.5 mg  7.5 mg Oral Daily Laveda Abbe, NP   7.5 mg at 01/17/20 1059  . multivitamin with minerals tablet 1 tablet  1 tablet Oral Daily Patrcia Dolly, FNP   1 tablet at 01/17/20 1059  . naltrexone (DEPADE) tablet 50 mg  50 mg Oral Daily Laveda Abbe, NP   50 mg at 01/17/20 1059  . nicotine (NICODERM CQ - dosed in mg/24 hours) patch 21 mg  21 mg Transdermal Daily Aldean Baker, NP   21 mg at 01/17/20 1101  . ondansetron (ZOFRAN-ODT) disintegrating tablet 4 mg  4 mg Oral Q6H PRN Patrcia Dolly, FNP      . propranolol (INDERAL) tablet 10 mg  10 mg Oral BID Aldean Baker, NP      . thiamine tablet 100 mg  100 mg Oral Daily Patrcia Dolly, FNP   100 mg at 01/17/20 1100  . traZODone (DESYREL) tablet 50 mg  50 mg Oral QHS PRN Patrcia Dolly, FNP   50 mg at 01/16/20 2111    Lab Results: No results found for this or any previous visit (from the past 48 hour(s)).  Blood Alcohol level:  Lab Results  Component Value Date   ETH 142 (H) 09/14/2019    Metabolic Disorder Labs: Lab Results  Component Value Date   HGBA1C 5.5 01/13/2020   MPG 111 01/13/2020   No results found for: PROLACTIN Lab Results  Component Value Date   CHOL 161 01/13/2020   TRIG 87 01/13/2020   HDL 70 01/13/2020   CHOLHDL 2.3 01/13/2020   VLDL 17 01/13/2020   LDLCALC 74 01/13/2020    Physical Findings: AIMS: Facial and Oral Movements Muscles of Facial Expression: None, normal Lips and  Perioral Area: None, normal Jaw: None, normal Tongue: None, normal,Extremity Movements Upper (arms, wrists, hands, fingers): None, normal Lower (legs, knees, ankles, toes): None, normal, Trunk Movements Neck, shoulders, hips: None, normal, Overall Severity Severity of abnormal movements (highest score from questions above): None, normal Incapacitation due to abnormal movements: None, normal Patient's awareness of abnormal movements (rate only patient's report): No Awareness, Dental Status Current problems with teeth and/or dentures?: No Does patient usually wear dentures?: No  CIWA:  CIWA-Ar Total: 2 COWS:     Musculoskeletal: Strength & Muscle Tone: within normal limits Gait & Station: normal Patient leans: N/A  Psychiatric Specialty Exam: Physical Exam Vitals and nursing note reviewed.  Constitutional:      Appearance: She is well-developed.  Pulmonary:     Effort: Pulmonary effort is normal.  Neurological:     Mental Status: She is alert and oriented to person, place, and time.     Review of Systems  Constitutional: Negative.   Respiratory: Negative for cough and shortness of breath.   Psychiatric/Behavioral: Positive for dysphoric mood and hallucinations. Negative for agitation, behavioral problems, confusion, decreased concentration, self-injury, sleep disturbance and suicidal ideas. The patient is not nervous/anxious and is not hyperactive.     Blood pressure (!) 133/92, pulse (!) 110, temperature 98.9 F (37.2 C), temperature source Oral, resp. rate 16, height 5' 9.69" (1.77 m), weight 80.3 kg, SpO2 100 %.Body mass index is 25.63 kg/m.  General Appearance: Casual  Eye Contact:  Fair  Speech:  Normal Rate  Volume:  Normal  Mood:  Depressed  Affect:  Congruent  Thought Process:  Coherent and Goal Directed  Orientation:  Full (Time, Place, and Person)  Thought Content:  Logical  Suicidal Thoughts:  No  Homicidal Thoughts:  No  Memory:  Immediate;   Fair Recent;    Fair  Judgement:  Intact  Insight:  Fair  Psychomotor Activity:  Normal  Concentration:  Concentration: Fair and Attention Span: Fair  Recall:  [employer] of Knowledge:  Fair  Language:  Good  Akathisia:  Yes  Handed:  Right  AIMS (if indicated):     Assets:  Communication Skills Desire for Improvement Social Support  ADL's:  Intact  Cognition:  WNL  Sleep:  Number of Hours: 5.5     Treatment Plan Summary: Daily contact with patient to assess and evaluate symptoms and progress in treatment and Medication management   Continue inpatient hospitalization.  Start propranolol 10 mg PO BID for akathisia Continue Abilify 5 mg PO QHS for psychosis/mood stability Abilify Maintenna 400 mg IM was given yesterday 01/17/20 Continue Ativan CIWA protocol for ETOH withdrawal Continue Mobic 7.5 mg PO daily for chronic pain Continue naltrexone 50 mg PO daily for opioid use disorder Continue thiamine 100 mg PO daily for supplementation Continue trazodone 50 mg PO QHS PRN insomnia  Patient will participate in the therapeutic group milieu.  Discharge disposition in progress.   Aldean Baker, NP 01/17/2020, 11:41 AM

## 2020-01-17 NOTE — B[facility] Counselor (Signed)
CSW contacted [facility] to inquire the status of the referral. CSW learned that referral is currently in review on 01/17/20 at 9:15am. CSW contacted [facility] to inquire the status of the referral. CSW learned that the nurse is currently reviewing the referral due to patient's primary problem being psych on 01/17/20 10:16am CSW contacted [facility] to inquire the status of the referral. CSW learned that the nurse has not yet reviewed the referral. 01/17/20 10:49am  Darlene Gates, Darlene Gates Clinicial Social Worker [employer]

## 2020-01-17 NOTE — [facility] Suicide Risk Assessment (Signed)
[facility] INPATIENT:  Family/Significant Other Suicide Prevention Education  Suicide Prevention Education:  Education Completed; Sarita Haver [phone] (Sister) has been identified by the patient as the family member/significant other with whom the patient will be residing, and identified as the person(s) who will aid the patient in the event of a mental health crisis (suicidal ideations/suicide attempt).  With written consent from the patient, the family member/significant other has been provided the following suicide prevention education, prior to the and/or following the discharge of the patient.  The suicide prevention education provided includes the following:  Suicide risk factors  Suicide prevention and interventions  National Suicide Hotline telephone number  [facility] assessment telephone number  [facility] Emergency Assistance 911  [facility] and/or Residential Mobile Crisis Unit telephone number  Request made of family/significant other to:  Remove weapons (e.g., guns, rifles, knives), all items previously/currently identified as safety concern.    Remove drugs/medications (over-the-counter, prescriptions, illicit drugs), all items previously/currently identified as a safety concern.  The family member/significant other verbalizes understanding of the suicide prevention education information provided.  The family member/significant other agrees to remove the items of safety concern listed above.   Mrs. Yetta Barre states that her sister has a substance use issue and needs an environment that will make her take her medications daily.  Mrs. Yetta Barre states that when her sister takes her medication she does better.  Mrs. Yetta Barre states that her sister use to be on a Narcan shot and that this was the most helpful medication so far.  Mrs. Yetta Barre states that her sister is involved with DSS and that she has had her sister's son with her for the last 7 months.  Mrs. Yetta Barre  states that the longest her sister has been clean with 10 months since she was approximately 34 years old.  Mrs. Yetta Barre states that her sister has been in and out of multiple rehabilitation facilities and none of them have worked.  Mrs. Yetta Barre states that her sister is homeless and that she has no access to weapons or firearms.  SPE was completed with Mrs. Jones who states that she checks in with her sister regularly by phone. Mrs. Yetta Barre also states that her sister cannot come live with her since she has the minor child and DSS is involved.       Metro Kung Myrtice Lowdermilk 01/17/2020, 11:10 AM

## 2020-01-17 NOTE — Progress Notes (Signed)
Pt reported that she was administered Abilify Maintena during the day and had questions about the medication, and subsequent administration. Teaching provided to pt on the medication, action,times of administration and possible side effects. Pt reports she prefers IM to oral medications as she has diffculty remembering to take her PO med. Patient education print out given as pt requested. Questions encouraged and answered. She denies any SI/HI at this time.

## 2020-01-17 NOTE — Progress Notes (Signed)
NUTRITION ASSESSMENT  Pt identified as at risk on the Malnutrition Screen Tool  INTERVENTION: 1. Supplements: Ensure Enlive po BID, each supplement provides 350 kcal and 20 grams of protein   NUTRITION DIAGNOSIS: Unintentional weight loss related to sub-optimal intake as evidenced by pt report.   Goal: Pt to meet >/= 90% of their estimated nutrition needs.  Monitor:  PO intake  Assessment:  Pt admitted for bipolar 1 disorder, depression. Pt with history of polysubstance abuse (alcohol, cocaine, opiates). Pt is also homeless. Per weight records, pt has lost 32 lbs since 6/15 (15% wt loss x 4 months, significant for time frame). Noted food allergies to peanut oil and pineapple. Will order Ensure supplements given weight loss.  Height: Ht Readings from Last 1 Encounters:  01/14/20 5' 9.69" (1.77 m)    Weight: Wt Readings from Last 1 Encounters:  01/14/20 80.3 kg    Weight Hx: Wt Readings from Last 10 Encounters:  01/14/20 80.3 kg  11/18/19 80.7 kg  09/14/19 95.3 kg  06/16/19 91.2 kg    BMI:  Body mass index is 25.63 kg/m. Pt meets criteria for normal based on current BMI.  Estimated Nutritional Needs: Kcal: 25-30 kcal/kg Protein: > 1 gram protein/kg Fluid: 1 ml/kcal  Diet Order:  Diet Order            Diet regular Room service appropriate? Yes; Fluid consistency: Thin  Diet effective now                Pt is also offered choice of unit snacks mid-morning and mid-afternoon.  Pt is eating as desired.   Lab results and medications reviewed.   Tilda Franco, MS, RD, LDN Inpatient Clinical Dietitian Contact information available via Amion

## 2020-01-17 NOTE — Progress Notes (Signed)
Pt denies SI or HI.  Pt endorses AH and says "the voices are loud and they are telling me bad things about myself."  Pt was anxious this morning before receiving her morning medications, but after receiving medications and flu and pneumonia shot, pt calmed down.  Pt attending groups and  Is talking with peers on the unit.  Pt took medications without incident and no adverse reactions were noted.  Pt remains safe through q 15 min safety checks. RN will continue to monitor and provide support as needed.

## 2020-01-17 NOTE — [facility] Group Notes (Signed)
Occupational Therapy Group Note Date: 01/17/2020 Group Topic/Focus: Self-Esteem  Group Description: Group encouraged increased engagement and participation through discussion and activity focused on self-esteem. Patients explored and discussed the differences between healthy and low self-esteem and how it affects our daily lives and occupations with a focus on relationships, work, school, self-care, and personal leisure interests. Group discussion then transitioned into identifying specific strategies to boost self-esteem and engaged in a collaborative and independent activity looking at positive ways to describe oneself A-Z.   Therapeutic Goal(s): Understand and recognize the differences between healthy and low self-esteem Identify healthy strategies to improve/build self-esteem Participation Level: Active   Participation Quality: Independent   Behavior: Calm, Cooperative and Interactive   Speech/Thought Process: Focused   Affect/Mood: Constricted   Insight: Fair   Judgement: Fair   Individualization: Darlene Gates was active in her participation of discussion and activity, observed to follow along and write down discussion points on personal paper. Contributed appropriately to discussion and identified "speed boating, arts and crafts, laughter, and bowling" as strategies to boost her self-esteem. Appeared receptive to education and alternative strategies offered by peers.   Modes of Intervention: Activity, Discussion and Education  Patient Response to Interventions:  Attentive, Engaged, Receptive and Interested   Plan: Continue to engage patient in OT groups 2 - 3x/week.  01/17/2020  Donne Hazel, MOT, OTR/L

## 2020-01-17 NOTE — Tx Team (Cosign Needed)
Interdisciplinary Treatment and Diagnostic Plan Update  01/17/2020 Time of Session: 9:30am Darlene Gates MRN: [id number]  Principal Diagnosis: Bipolar 1 disorder, depressed, severe ([place])  Secondary Diagnoses: Principal Problem:   Bipolar 1 disorder, depressed, severe ([place]) Active Problems:   Depression   Generalized anxiety disorder   Homelessness   Substance induced mood disorder (HCC)   Amphetamine abuse ([place])   Alcohol dependence ([place])   Opiate abuse, episodic ([place])   Current Medications:  Current Facility-Administered Medications  Medication Dose Route Frequency Provider Last Rate Last Admin  . acetaminophen (TYLENOL) tablet 650 mg  650 mg Oral Q6H PRN Ethelene Hal, NP      . alum & mag hydroxide-simeth (MAALOX/MYLANTA) 200-200-20 MG/5ML suspension 30 mL  30 mL Oral Q4H PRN Emmaline Kluver, FNP      . ARIPiprazole (ABILIFY) tablet 5 mg  5 mg Oral QHS Ethelene Hal, NP   5 mg at 01/16/20 2111  . ARIPiprazole ER (ABILIFY MAINTENA) injection 400 mg  400 mg Intramuscular Q28 days Ethelene Hal, NP   400 mg at 01/16/20 1650  . feeding supplement (ENSURE ENLIVE / ENSURE PLUS) liquid 237 mL  237 mL Oral BID BM Sharma Covert, MD      . hydrOXYzine (ATARAX/VISTARIL) tablet 25 mg  25 mg Oral Q6H PRN Emmaline Kluver, FNP   25 mg at 01/16/20 2113  . [START ON 01/18/2020] hydrOXYzine (ATARAX/VISTARIL) tablet 25 mg  25 mg Oral TID PRN Emmaline Kluver, FNP      . influenza vac split quadrivalent PF (FLUARIX) injection 0.5 mL  0.5 mL Intramuscular Tomorrow-1000 Sharma Covert, MD      . loperamide (IMODIUM) capsule 2-4 mg  2-4 mg Oral PRN Emmaline Kluver, FNP      . LORazepam (ATIVAN) tablet 1 mg  1 mg Oral TID Emmaline Kluver, FNP       Followed by  . LORazepam (ATIVAN) tablet 1 mg  1 mg Oral BID Emmaline Kluver, FNP       Followed by  . [START ON 01/19/2020] LORazepam (ATIVAN) tablet 1 mg  1 mg Oral Daily Letitia Libra L, FNP      . magnesium hydroxide (MILK OF MAGNESIA)  suspension 30 mL  30 mL Oral Daily PRN Emmaline Kluver, FNP      . meloxicam (MOBIC) tablet 7.5 mg  7.5 mg Oral Daily Ethelene Hal, NP      . multivitamin with minerals tablet 1 tablet  1 tablet Oral Daily Emmaline Kluver, FNP   1 tablet at 01/16/20 0800  . naltrexone (DEPADE) tablet 50 mg  50 mg Oral Daily Ethelene Hal, NP   50 mg at 01/16/20 1530  . nicotine (NICODERM CQ - dosed in mg/24 hours) patch 21 mg  21 mg Transdermal Daily Connye Burkitt, NP   21 mg at 01/16/20 0758  . ondansetron (ZOFRAN-ODT) disintegrating tablet 4 mg  4 mg Oral Q6H PRN Emmaline Kluver, FNP      . pneumococcal 23 valent vaccine (PNEUMOVAX-23) injection 0.5 mL  0.5 mL Intramuscular Tomorrow-1000 Sharma Covert, MD      . thiamine tablet 100 mg  100 mg Oral Daily Emmaline Kluver, FNP   100 mg at 01/16/20 0800  . traZODone (DESYREL) tablet 50 mg  50 mg Oral QHS PRN Emmaline Kluver, FNP   50 mg at 01/16/20 2111   PTA Medications: Medications Prior to Admission  Medication  Sig Dispense Refill Last Dose  . ARIPiprazole (ABILIFY) 2 MG tablet Take 2 mg by mouth daily.   More than a month at Unknown time  . busPIRone (BUSPAR) 7.5 MG tablet Take 7.5 mg by mouth 2 (two) times daily.   More than a month at Unknown time  . gabapentin (NEURONTIN) 300 MG capsule Take 300 mg by mouth 3 (three) times daily.   More than a month at Unknown time  . gabapentin (NEURONTIN) 600 MG tablet Take 1 tablet (600 mg total) by mouth 3 (three) times daily.   More than a month at Unknown time  . hydrOXYzine (ATARAX/VISTARIL) 50 MG tablet Take 50 mg by mouth every 8 (eight) hours as needed for anxiety.    More than a month at Unknown time  . naltrexone (DEPADE) 50 MG tablet Take 50 mg by mouth daily.   More than a month at Unknown time  . naproxen (NAPROSYN) 500 MG tablet Take 500 mg by mouth 2 (two) times daily.   More than a month at Unknown time  . OLANZapine zydis (ZYPREXA) 5 MG disintegrating tablet Take 1 tablet (5 mg total) by mouth 2  (two) times daily.   More than a month at Unknown time  . prazosin (MINIPRESS) 1 MG capsule Take 1 mg by mouth at bedtime.   More than a month at Unknown time  . topiramate (TOPAMAX) 25 MG tablet Take 25 mg by mouth 2 (two) times daily.   More than a month at Unknown time  . venlafaxine (EFFEXOR) 75 MG tablet Take 75 mg by mouth 2 (two) times daily.   More than a month at Unknown time    Patient Stressors: Financial difficulties Medication change or noncompliance Substance abuse  Patient Strengths: Motivation for treatment/growth Supportive family/friends  Treatment Modalities: Medication Management, Group therapy, Case management,  1 to 1 session with clinician, Psychoeducation, Recreational therapy.   Physician Treatment Plan for Primary Diagnosis: Bipolar 1 disorder, depressed, severe ([name]) Long Term Goal(s): Improvement in symptoms so as ready for discharge Improvement in symptoms so as ready for discharge   Short Term Goals: Ability to identify changes in lifestyle to reduce recurrence of condition will improve Ability to verbalize feelings will improve Ability to disclose and discuss suicidal ideas Ability to demonstrate self-control will improve Ability to identify and develop effective coping behaviors will improve Ability to maintain clinical measurements within normal limits will improve Compliance with prescribed medications will improve Ability to identify triggers associated with substance abuse/mental health issues will improve Ability to identify changes in lifestyle to reduce recurrence of condition will improve Ability to verbalize feelings will improve Ability to disclose and discuss suicidal ideas Ability to demonstrate self-control will improve Ability to identify and develop effective coping behaviors will improve Ability to maintain clinical measurements within normal limits will improve Compliance with prescribed medications will improve Ability to identify  triggers associated with substance abuse/mental health issues will improve  Medication Management: Evaluate patient's response, side effects, and tolerance of medication regimen.  Therapeutic Interventions: 1 to 1 sessions, Unit Group sessions and Medication administration.  Evaluation of Outcomes: Not Met  Physician Treatment Plan for Secondary Diagnosis: Principal Problem:   Bipolar 1 disorder, depressed, severe ([place]) Active Problems:   Depression   Generalized anxiety disorder   Homelessness   Substance induced mood disorder (HCC)   Amphetamine abuse ([place])   Alcohol dependence ([place])   Opiate abuse, episodic ([place])  Long Term Goal(s): Improvement in symptoms so as ready  for discharge Improvement in symptoms so as ready for discharge   Short Term Goals: Ability to identify changes in lifestyle to reduce recurrence of condition will improve Ability to verbalize feelings will improve Ability to disclose and discuss suicidal ideas Ability to demonstrate self-control will improve Ability to identify and develop effective coping behaviors will improve Ability to maintain clinical measurements within normal limits will improve Compliance with prescribed medications will improve Ability to identify triggers associated with substance abuse/mental health issues will improve Ability to identify changes in lifestyle to reduce recurrence of condition will improve Ability to verbalize feelings will improve Ability to disclose and discuss suicidal ideas Ability to demonstrate self-control will improve Ability to identify and develop effective coping behaviors will improve Ability to maintain clinical measurements within normal limits will improve Compliance with prescribed medications will improve Ability to identify triggers associated with substance abuse/mental health issues will improve     Medication Management: Evaluate patient's response, side effects, and tolerance of medication  regimen.  Therapeutic Interventions: 1 to 1 sessions, Unit Group sessions and Medication administration.  Evaluation of Outcomes: Not Met   RN Treatment Plan for Primary Diagnosis: Bipolar 1 disorder, depressed, severe ([place]) Long Term Goal(s): Knowledge of disease and therapeutic regimen to maintain health will improve  Short Term Goals: Ability to demonstrate self-control, Ability to participate in decision making will improve and Ability to identify and develop effective coping behaviors will improve  Medication Management: RN will administer medications as ordered by provider, will assess and evaluate patient's response and provide education to patient for prescribed medication. RN will report any adverse and/or side effects to prescribing provider.  Therapeutic Interventions: 1 on 1 counseling sessions, Psychoeducation, Medication administration, Evaluate responses to treatment, Monitor vital signs and CBGs as ordered, Perform/monitor CIWA, COWS, AIMS and Fall Risk screenings as ordered, Perform wound care treatments as ordered.  Evaluation of Outcomes: Not Met   LCSW Treatment Plan for Primary Diagnosis: Bipolar 1 disorder, depressed, severe ([place]) Long Term Goal(s): Safe transition to appropriate next level of care at discharge, Engage patient in therapeutic group addressing interpersonal concerns.  Short Term Goals: Engage patient in aftercare planning with referrals and resources, Increase ability to appropriately verbalize feelings, Facilitate patient progression through stages of change regarding substance use diagnoses and concerns and Identify triggers associated with mental health/substance abuse issues  Therapeutic Interventions: Assess for all discharge needs, 1 to 1 time with Social worker, Explore available resources and support systems, Assess for adequacy in community support network, Educate family and significant other(s) on suicide prevention, Complete Psychosocial  Assessment, Interpersonal group therapy.  Evaluation of Outcomes: Not Met   Progress in Treatment: Attending groups: Yes. Participating in groups: Yes. Taking medication as prescribed: Yes. Toleration medication: Yes. Family/Significant other contact made: No, will contact:  sister Haynes Bast [phone]) or w/ husband Illeana Edick [phone]) Patient understands diagnosis: Yes. Discussing patient identified problems/goals with staff: Yes. Medical problems stabilized or resolved: Yes. Denies suicidal/homicidal ideation: Yes. Issues/concerns per patient self-inventory: Yes. Other:   New problem(s) identified: No, Describe:  CSW will continue to assess  New Short Term/Long Term Goal(s): medication stabilization, elimination of SI thoughts, development of comprehensive mental wellness plan.  Patient Goals:  "Depression, mental health, and go to rehab."  Discharge Plan or Barriers: Patient recently admitted. CSW will continue to follow and assess for appropriate referrals and possible discharge planning.  Reason for Continuation of Hospitalization: Depression Medication stabilization Suicidal ideation  Estimated Length of Stay: 3-5 days  Attendees:  Patient: Darlene Gates 01/17/2020 10:10 AM  Physician: Dr. Jenne Campus 01/17/2020 10:10 AM  Nursing:  01/17/2020 10:10 AM  RN Care Manager: 01/17/2020 10:10 AM  Social Worker: Toney Reil, [place] 01/17/2020 10:10 AM  Recreational Therapist:  01/17/2020 10:10 AM  Other:  01/17/2020 10:10 AM  Other:  01/17/2020 10:10 AM  Other: 01/17/2020 10:10 AM    Scribe for Treatment Team: Mliss Fritz, [place] 01/17/2020 10:10 AM

## 2020-01-17 NOTE — Progress Notes (Addendum)
Recreation Therapy Notes  Date: 10.18.21 Time: 0930 Location: [address] Dayroom  Group Topic: Stress Management  Goal Area(s) Addresses:  Patient will identify positive stress management techniques. Patient will identify benefits of using stress management post d/c.  Intervention: Stress Management  Activity:  Meditation.  LRT played a meditation that focused on being resilient.  Patients were to listen and follow along as meditation played to engage in activity.    Education:  Stress Management, Discharge Planning.   Education Outcome: Acknowledges Education  Clinical Observations/Feedback: Pt did not attend group session.    Caroll Rancher, LRT/CTRS         Caroll Rancher A 01/17/2020 11:32 AM

## 2020-01-18 MED ORDER — PROPRANOLOL HCL 10 MG PO TABS
10.0000 mg | ORAL_TABLET | Freq: Three times a day (TID) | ORAL | Status: DC
  Administered 2020-01-18 – 2020-01-19 (×3): 10 mg via ORAL
  Filled 2020-01-18 (×8): qty 1

## 2020-01-18 MED ORDER — TRAZODONE HCL 100 MG PO TABS
100.0000 mg | ORAL_TABLET | Freq: Every evening | ORAL | Status: DC | PRN
  Administered 2020-01-18 – 2020-01-20 (×3): 100 mg via ORAL
  Filled 2020-01-18 (×3): qty 1
  Filled 2020-01-18: qty 7

## 2020-01-18 MED ORDER — ENSURE ENLIVE PO LIQD
237.0000 mL | Freq: Two times a day (BID) | ORAL | Status: DC
  Administered 2020-01-18 – 2020-01-19 (×3): 237 mL via ORAL

## 2020-01-18 NOTE — Progress Notes (Signed)
D:  Patient's self inventory sheet, patient has fair sleep, sleep medication none.  Fair appetite, low energy level, poor concentration.  Rated depression 9, hopeless 2, anxiety 10.  Withdrawals, tremors, chilling, cravings, cramping, agitation, nausea, irritability, isolation.  SI, contracts for safety.  Physical problems, lightheaded, pain, dizzy, headaches, blurred vision.  Physical pain, arms, aback, necks, legs.  Goal is residential rehab.   Plans to call people.  Denied discharge plans.  Maybe depression. A:  Medications administered per MD orders.  Emotional support and encouragement given patient. R:  Patient denied HI.  SI, contracts for safety, no plan.  Denied visual hallucinations. Does hear negative voices.  Safety maintained with 15 minute checks.

## 2020-01-18 NOTE — Plan of Care (Signed)
Nurse discussed anxiety, depression and coping skills with patient.  

## 2020-01-18 NOTE — Progress Notes (Signed)
   01/17/20 2107  COVID-19 Daily Checkoff  Have you had a fever (temp > 37.80C/100F)  in the past 24 hours?  No  COVID-19 EXPOSURE  Have you traveled outside the state in the past 14 days? No  Have you been in contact with someone with a confirmed diagnosis of COVID-19 or PUI in the past 14 days without wearing appropriate PPE? No  Have you been living in the same home as a person with confirmed diagnosis of COVID-19 or a PUI (household contact)? No  Have you been diagnosed with COVID-19? No

## 2020-01-18 NOTE — Progress Notes (Signed)
[facility] MD Progress Note  01/18/2020 12:36 PM Darlene Gates  MRN:  [id number] Subjective:  Darlene Gates is a 34 year old AA female who appears older than her stated age. She has a past history of Bipolar 1 disorder, current episode depressed, GAD, Substance induced mood disorder, Cocaine use disorder, and opiate use disorder, Alcohol dependence, and homelessness. She was a walk-in at [facility] asking for help for her Bipolar and substance abuse.   Ms. Chern found sitting in the dayroom. She presents with depressed affect and reports stress over her belongings being evicted from her storage facility. She does not have money to pay for continuing storage and has nowhere else to place her belongings. She reports ongoing SI but denies plan or intent. She denies AVH and shows no signs of responding to internal stimuli. She is unable to go to [facility] due to recently leaving their facility. Referrals are pending to [facility] and [facility]. She reports withdrawal symptoms of restlessness and irritability. No visible signs of akathisia on assessment today but patient does report feeling of restlessness in her legs. She complains of difficulty sleeping and states she only slept 2-3 hours last night. 5.5 hours of sleep recorded.  Principal Problem: Bipolar 1 disorder, depressed, severe (HCC) Diagnosis: Principal Problem:   Bipolar 1 disorder, depressed, severe (HCC) Active Problems:   Depression   Generalized anxiety disorder   Homelessness   Substance induced mood disorder (HCC)   Amphetamine abuse (HCC)   Alcohol dependence (HCC)   Opiate abuse, episodic (HCC)  Total Time spent with patient: 15 minutes  Past Psychiatric History: See admission H&P  Past Medical History:  Past Medical History:  Diagnosis Date  . Anxiety   . Bipolar 1 disorder (HCC)   . Depression   . History of alcohol abuse   . History of substance abuse (HCC)    History reviewed. No pertinent surgical history. Family History:   Family History  Problem Relation Age of Onset  . Alcohol abuse Mother   . Bipolar disorder Mother   . Depression Mother   . Alcohol abuse Father   . Hyperlipidemia Father   . Hypertension Father   . Alcohol abuse Maternal Grandmother   . Bipolar disorder Maternal Grandmother   . Alcohol abuse Maternal Grandfather    Family Psychiatric  History: See admission H&P Social History:  Social History   Substance and Sexual Activity  Alcohol Use Yes  . Alcohol/week: 4.0 standard drinks  . Types: 1 Shots of liquor, 3 Cans of beer per week   Comment: pt reports she drincks daily     Social History   Substance and Sexual Activity  Drug Use Not on file   Comment: pt reports she uses daily    Social History   Socioeconomic History  . Marital status: Married    Spouse name: Not on file  . Number of children: Not on file  . Years of education: Not on file  . Highest education level: Not on file  Occupational History  . Not on file  Tobacco Use  . Smoking status: Current Every Day Smoker    Packs/day: 0.50    Types: Cigarettes  . Smokeless tobacco: Never Used  Vaping Use  . Vaping Use: Never used  Substance and Sexual Activity  . Alcohol use: Yes    Alcohol/week: 4.0 standard drinks    Types: 1 Shots of liquor, 3 Cans of beer per week    Comment: pt reports she drincks daily  .  Drug use: Not on file    Comment: pt reports she uses daily  . Sexual activity: Yes    Partners: Male    Birth control/protection: Injection, Condom  Other Topics Concern  . Not on file  Social History Narrative  . Not on file   Social Determinants of Health   Financial Resource Strain:   . Difficulty of Paying Living Expenses: Not on file  Food Insecurity:   . Worried About Programme researcher, broadcasting/film/videounning Out of Food in the Last Year: Not on file  . Ran Out of Food in the Last Year: Not on file  Transportation Needs:   . Lack of Transportation (Medical): Not on file  . Lack of Transportation (Non-Medical): Not  on file  Physical Activity:   . Days of Exercise per Week: Not on file  . Minutes of Exercise per Session: Not on file  Stress:   . Feeling of Stress : Not on file  Social Connections:   . Frequency of Communication with Friends and Family: Not on file  . Frequency of Social Gatherings with Friends and Family: Not on file  . Attends Religious Services: Not on file  . Active Member of Clubs or Organizations: Not on file  . Attends BankerClub or Organization Meetings: Not on file  . Marital Status: Not on file   Additional Social History:                          Sleep: Good  Appetite:  Good  Current Medications: Current Facility-Administered Medications  Medication Dose Route Frequency Provider Last Rate Last Admin  . acetaminophen (TYLENOL) tablet 650 mg  650 mg Oral Q6H PRN Laveda AbbeParks, Laurie Britton, NP   650 mg at 01/17/20 2107  . alum & mag hydroxide-simeth (MAALOX/MYLANTA) 200-200-20 MG/5ML suspension 30 mL  30 mL Oral Q4H PRN Patrcia Dollyate, Tina L, FNP      . ARIPiprazole (ABILIFY) tablet 5 mg  5 mg Oral QHS Laveda AbbeParks, Laurie Britton, NP   5 mg at 01/17/20 2108  . ARIPiprazole ER (ABILIFY MAINTENA) injection 400 mg  400 mg Intramuscular Q28 days Laveda AbbeParks, Laurie Britton, NP   400 mg at 01/16/20 1650  . feeding supplement (ENSURE ENLIVE / ENSURE PLUS) liquid 237 mL  237 mL Oral BID BM Antonieta Pertlary, Greg Lawson, MD   237 mL at 01/17/20 1650  . hydrOXYzine (ATARAX/VISTARIL) tablet 25 mg  25 mg Oral TID PRN Patrcia Dollyate, Tina L, FNP      . [START ON 01/19/2020] LORazepam (ATIVAN) tablet 1 mg  1 mg Oral Daily Berneice Heinrichate, Tina L, FNP      . magnesium hydroxide (MILK OF MAGNESIA) suspension 30 mL  30 mL Oral Daily PRN Patrcia Dollyate, Tina L, FNP      . meloxicam (MOBIC) tablet 7.5 mg  7.5 mg Oral Daily Laveda AbbeParks, Laurie Britton, NP   7.5 mg at 01/18/20 0900  . multivitamin with minerals tablet 1 tablet  1 tablet Oral Daily Patrcia Dollyate, Tina L, FNP   1 tablet at 01/18/20 0900  . naltrexone (DEPADE) tablet 50 mg  50 mg Oral Daily Laveda AbbeParks, Laurie  Britton, NP   50 mg at 01/18/20 0900  . nicotine (NICODERM CQ - dosed in mg/24 hours) patch 21 mg  21 mg Transdermal Daily Marciano SequinSykes, Sierria Bruney E, NP   21 mg at 01/18/20 0900  . prazosin (MINIPRESS) capsule 1 mg  1 mg Oral QHS Aldean BakerSykes, Raford Brissett E, NP   1 mg at 01/17/20 2108  .  propranolol (INDERAL) tablet 10 mg  10 mg Oral BID Aldean Baker, NP   10 mg at 01/18/20 0900  . thiamine tablet 100 mg  100 mg Oral Daily Patrcia Dolly, FNP   100 mg at 01/18/20 0900  . traZODone (DESYREL) tablet 50 mg  50 mg Oral QHS PRN Patrcia Dolly, FNP   50 mg at 01/16/20 2111    Lab Results: No results found for this or any previous visit (from the past 48 hour(s)).  Blood Alcohol level:  Lab Results  Component Value Date   ETH 142 (H) 09/14/2019    Metabolic Disorder Labs: Lab Results  Component Value Date   HGBA1C 5.5 01/13/2020   MPG 111 01/13/2020   No results found for: PROLACTIN Lab Results  Component Value Date   CHOL 161 01/13/2020   TRIG 87 01/13/2020   HDL 70 01/13/2020   CHOLHDL 2.3 01/13/2020   VLDL 17 01/13/2020   LDLCALC 74 01/13/2020    Physical Findings: AIMS: Facial and Oral Movements Muscles of Facial Expression: None, normal Lips and Perioral Area: None, normal Jaw: None, normal Tongue: None, normal,Extremity Movements Upper (arms, wrists, hands, fingers): None, normal Lower (legs, knees, ankles, toes): None, normal, Trunk Movements Neck, shoulders, hips: None, normal, Overall Severity Severity of abnormal movements (highest score from questions above): None, normal Incapacitation due to abnormal movements: None, normal Patient's awareness of abnormal movements (rate only patient's report): No Awareness, Dental Status Current problems with teeth and/or dentures?: No Does patient usually wear dentures?: No  CIWA:  CIWA-Ar Total: 2 COWS:     Musculoskeletal: Strength & Muscle Tone: within normal limits Gait & Station: normal Patient leans: N/A  Psychiatric Specialty  Exam: Physical Exam Vitals and nursing note reviewed.  Constitutional:      Appearance: She is well-developed.  Cardiovascular:     Rate and Rhythm: Normal rate.  Pulmonary:     Effort: Pulmonary effort is normal.  Neurological:     Mental Status: She is alert and oriented to person, place, and time.     Review of Systems  Constitutional: Negative.   Respiratory: Negative for cough and shortness of breath.   Psychiatric/Behavioral: Positive for dysphoric mood and sleep disturbance. Negative for agitation, behavioral problems, confusion, decreased concentration, hallucinations, self-injury and suicidal ideas. The patient is nervous/anxious. The patient is not hyperactive.     Blood pressure 131/87, pulse 78, temperature 98.9 F (37.2 C), temperature source Oral, resp. rate 16, height 5' 9.69" (1.77 m), weight 80.3 kg, SpO2 100 %.Body mass index is 25.63 kg/m.  General Appearance: Fairly Groomed  Eye Contact:  Good  Speech:  Normal Rate  Volume:  Normal  Mood:  Anxious and Depressed  Affect:  Congruent  Thought Process:  Coherent  Orientation:  Full (Time, Place, and Person)  Thought Content:  Logical  Suicidal Thoughts:  Yes.  without intent/plan  Homicidal Thoughts:  No  Memory:  Immediate;   Fair Recent;   Fair  Judgement:  Intact  Insight:  Fair  Psychomotor Activity:  Normal  Concentration:  Concentration: Fair and Attention Span: Fair  Recall:  [employer] of Knowledge:  Fair  Language:  Good  Akathisia:  No  Handed:  Right  AIMS (if indicated):     Assets:  Communication Skills Desire for Improvement Leisure Time  ADL's:  Intact  Cognition:  WNL  Sleep:  Number of Hours: 5.3     Treatment Plan Summary: Daily contact with patient to  assess and evaluate symptoms and progress in treatment and Medication management   Continue inpatient hospitalization.  Increase trazodone to 100 mg PO QHS PRN insomnia Increase propranolol to 10 mg PO TID for  akathisia Continue Abilify 5 mg PO QHS for psychosis/mood stability Abilify Maintenna 400 mg IM was given yesterday 01/17/20 Continue Ativan CIWA protocol for ETOH withdrawal Continue Mobic 7.5 mg PO daily for chronic pain Continue naltrexone 50 mg PO daily for opioid use disorder Continue thiamine 100 mg PO daily for supplementation Continue Minipress 1 mg PO QHS for nightmares  Patient will participate in the therapeutic group milieu.  Discharge disposition in progress.   Aldean Baker, NP 01/18/2020, 12:36 PM

## 2020-01-18 NOTE — Progress Notes (Signed)
Spiritual care group on grief and loss facilitated by chaplain Beckham Capistran  Group Goal:  Support / Education around grief and loss Members engage in facilitated group support and psycho-social education.  Group Description:  Following introductions and group rules, group members engaged in facilitated group dialog and support around topic of loss, with particular support around experiences of loss in their lives. Group Identified types of loss (relationships / self / things) and identified patterns, circumstances, and changes that precipitate losses. Reflected on thoughts / feelings around loss, normalized grief responses, and recognized variety in grief experience. Patient Progress:  

## 2020-01-18 NOTE — Progress Notes (Signed)
Pt is concerned about her storage unit tonight because she may end up losing her things if she doesn't pay it off. 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 Her plan for discharge is to attend [facility] for 30 days. Pt denies SI/HI and VH. She does endorse AH of voices telling her "negative things" but she wouldn't elaborate further. She verbally contracts for safety, agrees to approach staff immediately for any thoughts of hurting herself or anyone else. Active listening, reassurance, and support provided. Medications administered as ordered by MD. Q 15 min safety checks continue. Pt's safety has been maintained.   01/17/20 2107  Psych Admission Type (Psych Patients Only)  Admission Status Voluntary  Psychosocial Assessment  Patient Complaints Anxiety;Depression;Sadness;Worrying;Substance abuse  Eye Contact Brief  Facial Expression Flat  Affect Depressed;Anxious;Sad;Flat  Speech Logical/coherent  Interaction Assertive  Motor Activity Slow  Appearance/Hygiene Disheveled  Behavior Characteristics Cooperative;Anxious;Calm  Mood Depressed;Anxious;Sad  Thought Process  Coherency Circumstantial  Content WDL  Delusions None reported or observed  Perception Hallucinations  Hallucination Auditory  Judgment Impaired  Confusion None  Danger to Self  Current suicidal ideation? Denies  Self-Injurious Behavior No self-injurious ideation or behavior indicators observed or expressed   Agreement Not to Harm Self Yes  Description of Agreement verbally contracts for safety  Danger to Others  Danger to Others None reported or observed

## 2020-01-18 NOTE — BHH Counselor (Addendum)
CSW was informed 10/19 that pt had been accepted to [facility] residential treatment. CSW received a call 10/19 at 9am from Ms. June at [facility] stating that the pt was d/c from that facility apx. 1 month ago and that their policy states that pts can not return prior to 90 days from the discharge date. CSW informed the pt and will follow up to other referrals at [facility] and [facility].  CSW contacted [facility] on 01/18/20 at 9:42am to inquire on the status of the referral. CSW learned that she has been accepted to the program and is waiting on a bed opening. CSW was informed to call after 12 on 01/19/20.   Fredirick Lathe, LCSWA Clinicial Social Worker [employer]

## 2020-01-18 NOTE — Progress Notes (Signed)
Recreation Therapy Notes  Animal-Assisted Activity (AAA) Program Checklist/Progress Notes Patient Eligibility Criteria Checklist & Daily Group note for Rec Tx Intervention  Date: 10.19.21 Time: 1430 Location: [address]   AAA/T Program Assumption of Risk Form signed by Engineer, production or Parent Legal Guardian  YES  Patient is free of allergies or sever asthma  YES  Patient reports no fear of animals  YES  Patient reports no history of cruelty to animals YES  Patient understands his/her participation is voluntary  YES  Patient washes hands before animal contact  YES   Patient washes hands after animal contact  YES   Behavioral Response: Engaged  Education: Charity fundraiser, Appropriate Animal Interaction   Education Outcome: Acknowledges understanding/In group clarification offered/Needs additional education.  Clinical Observations/Feedback: Pt attended and participated in activity.    Caroll Rancher, LRT/CTRS         Caroll Rancher A 01/18/2020 3:27 PM

## 2020-01-18 NOTE — Progress Notes (Signed)
   01/18/20 2029  COVID-19 Daily Checkoff  Have you had a fever (temp > 37.80C/100F)  in the past 24 hours?  No  COVID-19 EXPOSURE  Have you traveled outside the state in the past 14 days? No  Have you been in contact with someone with a confirmed diagnosis of COVID-19 or PUI in the past 14 days without wearing appropriate PPE? No  Have you been living in the same home as a person with confirmed diagnosis of COVID-19 or a PUI (household contact)? No  Have you been diagnosed with COVID-19? No

## 2020-01-19 ENCOUNTER — Inpatient Hospital Stay: Payer: Medicaid Other

## 2020-01-19 DIAGNOSIS — Z23 Encounter for immunization: Secondary | ICD-10-CM

## 2020-01-19 MED ORDER — LORATADINE 10 MG PO TABS
10.0000 mg | ORAL_TABLET | Freq: Every day | ORAL | Status: DC
  Administered 2020-01-19 – 2020-01-21 (×3): 10 mg via ORAL
  Filled 2020-01-19 (×4): qty 1
  Filled 2020-01-19: qty 7

## 2020-01-19 MED ORDER — METRONIDAZOLE 500 MG PO TABS
500.0000 mg | ORAL_TABLET | Freq: Two times a day (BID) | ORAL | Status: DC
  Administered 2020-01-19 – 2020-01-21 (×4): 500 mg via ORAL
  Filled 2020-01-19: qty 1
  Filled 2020-01-19: qty 10
  Filled 2020-01-19: qty 1
  Filled 2020-01-19: qty 10
  Filled 2020-01-19 (×4): qty 1
  Filled 2020-01-19: qty 10

## 2020-01-19 MED ORDER — ALBUTEROL SULFATE HFA 108 (90 BASE) MCG/ACT IN AERS: 1.0000 | INHALATION_SPRAY | Freq: Four times a day (QID) | RESPIRATORY_TRACT | Status: DC | PRN

## 2020-01-19 MED ORDER — PROPRANOLOL HCL 10 MG PO TABS
15.0000 mg | ORAL_TABLET | Freq: Three times a day (TID) | ORAL | Status: DC
  Administered 2020-01-19 – 2020-01-21 (×5): 15 mg via ORAL
  Filled 2020-01-19: qty 1.5
  Filled 2020-01-19: qty 32
  Filled 2020-01-19 (×3): qty 1.5
  Filled 2020-01-19 (×2): qty 32
  Filled 2020-01-19 (×4): qty 1.5

## 2020-01-19 MED ORDER — ARIPIPRAZOLE 10 MG PO TABS
10.0000 mg | ORAL_TABLET | Freq: Every day | ORAL | Status: DC
  Administered 2020-01-19 – 2020-01-20 (×2): 10 mg via ORAL
  Filled 2020-01-19 (×2): qty 1
  Filled 2020-01-19: qty 7
  Filled 2020-01-19 (×3): qty 1

## 2020-01-19 NOTE — Progress Notes (Signed)
[facility] MD Progress Note  01/19/2020 2:34 PM Darlene Gates  MRN:  [id number] Subjective:  Darlene Gates is a 34 year old AA female who appears older than her stated age. She has a past history of Bipolar 1 disorder, current episode depressed, GAD, Substance induced mood disorder, Cocaine use disorder, and opiate use disorder, Alcohol dependence, and homelessness. She was a walk-in at [facility] asking for help for her Bipolar and substance abuse.  Ms. Debord found sitting in her room. She has been more visible in the milieu, interacting appropriately with others and with fuller range of affect. She continues to report depressed mood related to homelessness and anxiety related to needing rehab. She denies SI. She denies AVH but reports continuing AH at times of voices making negative comments. She has shown no signs of responding to internal stimuli.   She is complaining of cloudy vaginal discharge with foul odor. She reports prior history of BV with similar symptoms. She is also complaining of allergy symptoms and requesting to be restarted on allergy medicine. No visible signs of akathisia on assessment, but she does continue to report inner feeling of restlessness in her legs.  Principal Problem: Bipolar 1 disorder, depressed, severe (HCC) Diagnosis: Principal Problem:   Bipolar 1 disorder, depressed, severe (HCC) Active Problems:   Depression   Generalized anxiety disorder   Homelessness   Substance induced mood disorder (HCC)   Amphetamine abuse (HCC)   Alcohol dependence (HCC)   Opiate abuse, episodic (HCC)  Total Time spent with patient: 30 minutes  Past Psychiatric History: See admission H&P  Past Medical History:  Past Medical History:  Diagnosis Date  . Anxiety   . Bipolar 1 disorder (HCC)   . Depression   . History of alcohol abuse   . History of substance abuse (HCC)    History reviewed. No pertinent surgical history. Family History:  Family History  Problem Relation  Age of Onset  . Alcohol abuse Mother   . Bipolar disorder Mother   . Depression Mother   . Alcohol abuse Father   . Hyperlipidemia Father   . Hypertension Father   . Alcohol abuse Maternal Grandmother   . Bipolar disorder Maternal Grandmother   . Alcohol abuse Maternal Grandfather    Family Psychiatric  History: See admission H&P Social History:  Social History   Substance and Sexual Activity  Alcohol Use Yes  . Alcohol/week: 4.0 standard drinks  . Types: 1 Shots of liquor, 3 Cans of beer per week   Comment: pt reports she drincks daily     Social History   Substance and Sexual Activity  Drug Use Not on file   Comment: pt reports she uses daily    Social History   Socioeconomic History  . Marital status: Married    Spouse name: Not on file  . Number of children: Not on file  . Years of education: Not on file  . Highest education level: Not on file  Occupational History  . Not on file  Tobacco Use  . Smoking status: Current Every Day Smoker    Packs/day: 0.50    Types: Cigarettes  . Smokeless tobacco: Never Used  Vaping Use  . Vaping Use: Never used  Substance and Sexual Activity  . Alcohol use: Yes    Alcohol/week: 4.0 standard drinks    Types: 1 Shots of liquor, 3 Cans of beer per week    Comment: pt reports she drincks daily  . Drug use: Not on file  Comment: pt reports she uses daily  . Sexual activity: Yes    Partners: Male    Birth control/protection: Injection, Condom  Other Topics Concern  . Not on file  Social History Narrative  . Not on file   Social Determinants of Health   Financial Resource Strain:   . Difficulty of Paying Living Expenses: Not on file  Food Insecurity:   . Worried About Programme researcher, broadcasting/film/video in the Last Year: Not on file  . Ran Out of Food in the Last Year: Not on file  Transportation Needs:   . Lack of Transportation (Medical): Not on file  . Lack of Transportation (Non-Medical): Not on file  Physical Activity:   .  Days of Exercise per Week: Not on file  . Minutes of Exercise per Session: Not on file  Stress:   . Feeling of Stress : Not on file  Social Connections:   . Frequency of Communication with Friends and Family: Not on file  . Frequency of Social Gatherings with Friends and Family: Not on file  . Attends Religious Services: Not on file  . Active Member of Clubs or Organizations: Not on file  . Attends Banker Meetings: Not on file  . Marital Status: Not on file   Additional Social History:                         Sleep: Good  Appetite:  Good  Current Medications: Current Facility-Administered Medications  Medication Dose Route Frequency Provider Last Rate Last Admin  . acetaminophen (TYLENOL) tablet 650 mg  650 mg Oral Q6H PRN Laveda Abbe, NP   650 mg at 01/19/20 3662  . albuterol (VENTOLIN HFA) 108 (90 Base) MCG/ACT inhaler 1-2 puff  1-2 puff Inhalation Q6H PRN Aldean Baker, NP      . alum & mag hydroxide-simeth (MAALOX/MYLANTA) 200-200-20 MG/5ML suspension 30 mL  30 mL Oral Q4H PRN Patrcia Dolly, FNP      . ARIPiprazole (ABILIFY) tablet 10 mg  10 mg Oral QHS Aldean Baker, NP      . ARIPiprazole ER (ABILIFY MAINTENA) injection 400 mg  400 mg Intramuscular Q28 days Laveda Abbe, NP   400 mg at 01/16/20 1650  . feeding supplement (ENSURE ENLIVE / ENSURE PLUS) liquid 237 mL  237 mL Oral BID BM Antonieta Pert, MD   237 mL at 01/18/20 1100  . feeding supplement (ENSURE ENLIVE / ENSURE PLUS) liquid 237 mL  237 mL Oral BID BM Aldean Baker, NP   237 mL at 01/19/20 1019  . hydrOXYzine (ATARAX/VISTARIL) tablet 25 mg  25 mg Oral TID PRN Patrcia Dolly, FNP   25 mg at 01/19/20 0826  . loratadine (CLARITIN) tablet 10 mg  10 mg Oral Daily Aldean Baker, NP   10 mg at 01/19/20 1208  . magnesium hydroxide (MILK OF MAGNESIA) suspension 30 mL  30 mL Oral Daily PRN Patrcia Dolly, FNP      . meloxicam (MOBIC) tablet 7.5 mg  7.5 mg Oral Daily Laveda Abbe, NP   7.5 mg at 01/19/20 9476  . metroNIDAZOLE (FLAGYL) tablet 500 mg  500 mg Oral BID Aldean Baker, NP      . multivitamin with minerals tablet 1 tablet  1 tablet Oral Daily Patrcia Dolly, FNP   1 tablet at 01/19/20 5465  . naltrexone (DEPADE) tablet 50 mg  50 mg Oral  Daily Laveda AbbeParks, Laurie Britton, NP   50 mg at 01/19/20 [id number]  . nicotine (NICODERM CQ - dosed in mg/24 hours) patch 21 mg  21 mg Transdermal Daily Aldean BakerSykes, Ell Tiso E, NP   21 mg at 01/19/20 [id number]  . prazosin (MINIPRESS) capsule 1 mg  1 mg Oral QHS Aldean BakerSykes, Sheryle Vice E, NP   1 mg at 01/18/20 2034  . propranolol (INDERAL) tablet 15 mg  15 mg Oral TID Aldean BakerSykes, Veronika Heard E, NP      . thiamine tablet 100 mg  100 mg Oral Daily Patrcia Dollyate, Tina L, FNP   100 mg at 01/19/20 [id number]  . traZODone (DESYREL) tablet 100 mg  100 mg Oral QHS PRN Aldean BakerSykes, Erica Osuna E, NP   100 mg at 01/18/20 2029    Lab Results: No results found for this or any previous visit (from the past 48 hour(s)).  Blood Alcohol level:  Lab Results  Component Value Date   ETH 142 (H) 09/14/2019    Metabolic Disorder Labs: Lab Results  Component Value Date   HGBA1C 5.5 01/13/2020   MPG 111 01/13/2020   No results found for: PROLACTIN Lab Results  Component Value Date   CHOL 161 01/13/2020   TRIG 87 01/13/2020   HDL 70 01/13/2020   CHOLHDL 2.3 01/13/2020   VLDL 17 01/13/2020   LDLCALC 74 01/13/2020    Physical Findings: AIMS: Facial and Oral Movements Muscles of Facial Expression: None, normal Lips and Perioral Area: None, normal Jaw: None, normal Tongue: None, normal,Extremity Movements Upper (arms, wrists, hands, fingers): None, normal Lower (legs, knees, ankles, toes): None, normal, Trunk Movements Neck, shoulders, hips: None, normal, Overall Severity Severity of abnormal movements (highest score from questions above): None, normal Incapacitation due to abnormal movements: None, normal Patient's awareness of abnormal movements (rate only patient's report): No Awareness,  Dental Status Current problems with teeth and/or dentures?: No Does patient usually wear dentures?: No  CIWA:  CIWA-Ar Total: 1 COWS:     Musculoskeletal: Strength & Muscle Tone: within normal limits Gait & Station: normal Patient leans: N/A  Psychiatric Specialty Exam: Physical Exam Vitals and nursing note reviewed.  Constitutional:      Appearance: She is well-developed.  Cardiovascular:     Rate and Rhythm: Normal rate.  Pulmonary:     Effort: Pulmonary effort is normal.  Neurological:     Mental Status: She is alert and oriented to person, place, and time.     Review of Systems  Constitutional: Negative.   HENT: Positive for rhinorrhea.   Eyes: Positive for itching.  Respiratory: Negative for cough and shortness of breath.   Gastrointestinal: Negative for diarrhea, nausea and vomiting.  Genitourinary: Positive for vaginal discharge and vaginal pain. Negative for frequency and urgency.  Psychiatric/Behavioral: Positive for dysphoric mood and hallucinations. Negative for agitation, behavioral problems, confusion, decreased concentration, self-injury, sleep disturbance and suicidal ideas. The patient is not nervous/anxious and is not hyperactive.     Blood pressure 106/70, pulse 87, temperature 98.1 F (36.7 C), temperature source Oral, resp. rate 18, height 5' 9.69" (1.77 m), weight 80.3 kg, SpO2 100 %.Body mass index is 25.63 kg/m.  General Appearance: Casual  Eye Contact:  Good  Speech:  Normal Rate  Volume:  Normal  Mood:  Anxious and Depressed  Affect:  Congruent  Thought Process:  Coherent and Goal Directed  Orientation:  Full (Time, Place, and Person)  Thought Content:  Logical  Suicidal Thoughts:  No  Homicidal Thoughts:  No  Memory:  Immediate;  Good Recent;   Good  Judgement:  Intact  Insight:  Fair  Psychomotor Activity:  Normal  Concentration:  Concentration: Fair and Attention Span: Fair  Recall:  Good  Fund of Knowledge:  Fair  Language:  Good   Akathisia:  Yes  Handed:  Right  AIMS (if indicated):     Assets:  Communication Skills Desire for Improvement Social Support  ADL's:  Intact  Cognition:  WNL  Sleep:  Number of Hours: 6.75     Treatment Plan Summary: Daily contact with patient to assess and evaluate symptoms and progress in treatment and Medication management   Continue inpatient hospitalization.  2nd Pfizer COVID vaccine given today Increase propranolol to 15 mg PO TID for akathisia Increase Abilify to 10 mg PO QHS for psychosis/mood stability Abilify Maintenna 400 mg IM was given yesterday 01/17/20 Start Flagyl 500 mg PO BID for BV Continue trazodone 100 mg PO QHS PRN insomnia Continue Mobic 7.5 mg PO daily for chronic pain Continue naltrexone 50 mg PO daily for opioid use disorder Continue thiamine 100 mg PO daily for supplementation Continue Minipress 1 mg PO QHS for nightmares  Discharge disposition in progress.   Patient will participate in the therapeutic group milieu.  Aldean Baker, NP 01/19/2020, 2:34 PM

## 2020-01-19 NOTE — Plan of Care (Signed)
Nurse discussed anxiety, depression and coping skills with patient.  

## 2020-01-19 NOTE — Progress Notes (Signed)
   Covid-19 Vaccination Clinic  Name:  Darlene Gates    MRN: [id number] DOB: [birth year]  01/19/2020  Darlene Gates was observed post Covid-19 immunization for 30 minutes based on pre-vaccination screening without incident. She was provided with Vaccine Information Sheet and instruction to access the V-Safe system.   Darlene Gates was instructed to call 911 with any severe reactions post vaccine: Marland Kitchen Difficulty breathing  . Swelling of face and throat  . A fast heartbeat  . A bad rash all over body  . Dizziness and weakness   Immunizations Administered    Name Date Dose VIS Date Route   Pfizer COVID-19 Vaccine 01/19/2020  1:10 PM 0.3 mL 05/26/2018 Intramuscular   Manufacturer: [employer], [employer]   Lot: [id number]   NDC: [address]-1

## 2020-01-19 NOTE — Progress Notes (Addendum)
Psychoeducational Group Note  Date:  01/19/2020 Time:  2030  Group Topic/Focus:  wrap up group  Participation Level: Did Not Attend  Participation Quality:  Not Applicable  Affect:  Not Applicable  Cognitive:  Not Applicable  Insight:  Not Applicable  Engagement in Group: Not Applicable  Additional Comments:  Pt was notified that group was beginning but declined and remained in room during group time.   Marcille Buffy 01/19/2020, 9:20 PM

## 2020-01-19 NOTE — BHH Group Notes (Signed)
B[facility] LCSW Group Therapy  01/19/2020 3:05 PM  Type of Therapy:  Coping Skills Outside  Participation Level:  Minimal  Participation Quality:  Inattentive  Affect:  Appropriate  Cognitive:  Appropriate  Insight:  Improving  Engagement in Therapy:  Limited  Modes of Intervention:  Activity  Summary of Progress/Problems: This patient joined group half way through and stayed to herself the majority of the group.  Ryan Palermo A Jacole Capley 01/19/2020, 3:05 PM

## 2020-01-19 NOTE — Progress Notes (Signed)
D:  Patient denied SI and HI, contracts for safety.  Denied A/V hallucinations.   A:  Medications administered per MD orders.  Emotional support and encouragement given patient. R:  Safety maintained with 15 minute checks.  

## 2020-01-19 NOTE — Progress Notes (Signed)
   01/19/20 1700  Psych Admission Type (Psych Patients Only)  Admission Status Voluntary  Psychosocial Assessment  Patient Complaints Anxiety;Depression;Sadness  Eye Contact Fair  Facial Expression Flat;Sad;Sullen  Affect Depressed;Anxious;Sad  Speech Logical/coherent  Interaction Minimal  Motor Activity Slow  Appearance/Hygiene Disheveled  Behavior Characteristics Anxious  Mood Depressed;Anxious  Thought Process  Coherency WDL  Content WDL  Delusions None reported or observed  Perception Hallucinations  Hallucination Auditory  Judgment Impaired  Confusion None  Danger to Self  Current suicidal ideation? Denies  Self-Injurious Behavior No self-injurious ideation or behavior indicators observed or expressed   Agreement Not to Harm Self Yes  Description of Agreement verbally contracts for safety  Danger to Others  Danger to Others None reported or observed  D: Patient in dayroom interacting with peers reports she had a good day. Pt stated she is tolerated medication well. A: Medications administered as prescribed. Support and encouragement provided as needed.  R: Patient remains safe on the unit. Will continue to monitor for safety and stability.

## 2020-01-19 NOTE — Progress Notes (Signed)
    01/18/20 2029  Psych Admission Type (Psych Patients Only)  Admission Status Voluntary  Psychosocial Assessment  Patient Complaints Anxiety;Depression;Sadness  Eye Contact Fair  Facial Expression Flat;Sad;Sullen  Affect Depressed;Anxious;Sad  Speech Logical/coherent  Interaction Minimal  Motor Activity Slow  Appearance/Hygiene Disheveled  Behavior Characteristics Anxious;Calm;Cooperative  Mood Depressed;Anxious;Sad  Thought Process  Coherency WDL  Content WDL  Delusions None reported or observed  Perception Hallucinations  Hallucination Auditory  Judgment Impaired  Confusion None  Danger to Self  Current suicidal ideation? Denies  Self-Injurious Behavior No self-injurious ideation or behavior indicators observed or expressed   Agreement Not to Harm Self Yes  Description of Agreement verbally contracts for safety  Danger to Others  Danger to Others None reported or observed   Pt still endorses AH of negative thoughts. She denies SI/HI and VH and verbally contracts for safety. She has been active in the dayroom, interacting with her peers, and watching television. She did c/o withdrawal symptoms like itching of her face, arms, and legs but pt hasn't been observed to be scratching herself. She did report trouble sleeping last night for which her PRN trazodone was administered at 2029. She also said she had a headache rated an 8 on scale of 0-10 (10 being the worse) for which her PRN tylenol was administered at 2029. Active listening, reassurance, and support provided. Medications administered as ordered by MD. Q 15 min safety checks initiated. Pt's safety has been maintained.

## 2020-01-20 DIAGNOSIS — F250 Schizoaffective disorder, bipolar type: Secondary | ICD-10-CM | POA: Diagnosis not present

## 2020-01-20 NOTE — BHH Counselor (Signed)
CSW contacted [facility] to inquire on the status of an open bed 10/21 9:17am. CSW learned that their census is lessened currently and will not be taking new admission until next week or the week after. CSW contacted [facility] 10/21 at 9:20am and learned that are currently no open beds and that it will be weeks before they have an opening. Pt will need to follow-up from home. CSW will supply pt with shelter resources.  Fredirick Lathe, LCSWA Clinicial Social Worker [employer]

## 2020-01-20 NOTE — [facility] Group Notes (Signed)
The focus of this group is to help patients establish daily goals to achieve during treatment and discuss how the patient can incorporate goal setting into their daily lives to aide in recovery.  Pt did not attend group 

## 2020-01-20 NOTE — Progress Notes (Signed)
[facility] MD Progress Note  01/20/2020 5:25 PM Darlene Gates  MRN:  [id number] Subjective:  Darlene Gates is a 34 year old AA female who appears older than her stated age. She has a past history of Bipolar 1 disorder, current episode depressed, GAD, Substance induced mood disorder, Cocaine use disorder, and opiate use disorder, Alcohol dependence, and homelessness. She was a walk-in at [facility] asking for help for her Bipolar and substance abuse.  Pt observed interacting with peers appropriately on approach. She is agreeable to interview. TP is linear and goal directed with no bizarre TC, no objective evidence of mania or psychosis, not RIS. She expresses concern about obtaining rehab placement. Discussed at length with patient that currently there are no free beds that are available until next week and that she will not be able to be discharged to rehab directly from the hospital. Pt immediately stated that she does not feel she is able to remain sober because the shelter where she was previously staying has people using drugs outside. Discussed role of hospitalization and that she may find another shelter if she does not feel that one is adequate. Verbalized understanding. Denies HI. No AVH. When informed if likely discharge pt stated that she "kind of" has thoughts of harming herself and when asked what would make these thoughts leave she expresses that a certainty about rehab placement would.   Principal Problem: Bipolar 1 disorder, depressed, severe (HCC) Diagnosis: Principal Problem:   Bipolar 1 disorder, depressed, severe (HCC) Active Problems:   Depression   Generalized anxiety disorder   Homelessness   Substance induced mood disorder (HCC)   Amphetamine abuse (HCC)   Alcohol dependence (HCC)   Opiate abuse, episodic (HCC)  Total Time spent with patient: 20 minutes  Past Psychiatric History: See admission H&P  Past Medical History:  Past Medical History:  Diagnosis Date  . Anxiety    . Bipolar 1 disorder (HCC)   . Depression   . History of alcohol abuse   . History of substance abuse (HCC)    History reviewed. No pertinent surgical history. Family History:  Family History  Problem Relation Age of Onset  . Alcohol abuse Mother   . Bipolar disorder Mother   . Depression Mother   . Alcohol abuse Father   . Hyperlipidemia Father   . Hypertension Father   . Alcohol abuse Maternal Grandmother   . Bipolar disorder Maternal Grandmother   . Alcohol abuse Maternal Grandfather    Family Psychiatric  History: See admission H&P Social History:  Social History   Substance and Sexual Activity  Alcohol Use Yes  . Alcohol/week: 4.0 standard drinks  . Types: 1 Shots of liquor, 3 Cans of beer per week   Comment: pt reports she drincks daily     Social History   Substance and Sexual Activity  Drug Use Not on file   Comment: pt reports she uses daily    Social History   Socioeconomic History  . Marital status: Married    Spouse name: Not on file  . Number of children: Not on file  . Years of education: Not on file  . Highest education level: Not on file  Occupational History  . Not on file  Tobacco Use  . Smoking status: Current Every Day Smoker    Packs/day: 0.50    Types: Cigarettes  . Smokeless tobacco: Never Used  Vaping Use  . Vaping Use: Never used  Substance and Sexual Activity  . Alcohol use:  Yes    Alcohol/week: 4.0 standard drinks    Types: 1 Shots of liquor, 3 Cans of beer per week    Comment: pt reports she drincks daily  . Drug use: Not on file    Comment: pt reports she uses daily  . Sexual activity: Yes    Partners: Male    Birth control/protection: Injection, Condom  Other Topics Concern  . Not on file  Social History Narrative  . Not on file   Social Determinants of Health   Financial Resource Strain:   . Difficulty of Paying Living Expenses: Not on file  Food Insecurity:   . Worried About Programme researcher, broadcasting/film/videounning Out of Food in the Last Year:  Not on file  . Ran Out of Food in the Last Year: Not on file  Transportation Needs:   . Lack of Transportation (Medical): Not on file  . Lack of Transportation (Non-Medical): Not on file  Physical Activity:   . Days of Exercise per Week: Not on file  . Minutes of Exercise per Session: Not on file  Stress:   . Feeling of Stress : Not on file  Social Connections:   . Frequency of Communication with Friends and Family: Not on file  . Frequency of Social Gatherings with Friends and Family: Not on file  . Attends Religious Services: Not on file  . Active Member of Clubs or Organizations: Not on file  . Attends BankerClub or Organization Meetings: Not on file  . Marital Status: Not on file   Additional Social History:                         Sleep: Good  Appetite:  Good  Current Medications: Current Facility-Administered Medications  Medication Dose Route Frequency Provider Last Rate Last Admin  . acetaminophen (TYLENOL) tablet 650 mg  650 mg Oral Q6H PRN Laveda AbbeParks, Laurie Britton, NP   650 mg at 01/19/20 [id number]  . albuterol (VENTOLIN HFA) 108 (90 Base) MCG/ACT inhaler 1-2 puff  1-2 puff Inhalation Q6H PRN Aldean BakerSykes, Janet E, NP      . alum & mag hydroxide-simeth (MAALOX/MYLANTA) 200-200-20 MG/5ML suspension 30 mL  30 mL Oral Q4H PRN Patrcia Dollyate, Tina L, FNP      . ARIPiprazole (ABILIFY) tablet 10 mg  10 mg Oral QHS Aldean BakerSykes, Janet E, NP   10 mg at 01/19/20 2106  . ARIPiprazole ER (ABILIFY MAINTENA) injection 400 mg  400 mg Intramuscular Q28 days Laveda AbbeParks, Laurie Britton, NP   400 mg at 01/16/20 1650  . hydrOXYzine (ATARAX/VISTARIL) tablet 25 mg  25 mg Oral TID PRN Patrcia Dollyate, Tina L, FNP   25 mg at 01/19/20 2106  . loratadine (CLARITIN) tablet 10 mg  10 mg Oral Daily Aldean BakerSykes, Janet E, NP   10 mg at 01/20/20 0815  . magnesium hydroxide (MILK OF MAGNESIA) suspension 30 mL  30 mL Oral Daily PRN Patrcia Dollyate, Tina L, FNP      . meloxicam (MOBIC) tablet 7.5 mg  7.5 mg Oral Daily Laveda AbbeParks, Laurie Britton, NP   7.5 mg at 01/20/20  0815  . metroNIDAZOLE (FLAGYL) tablet 500 mg  500 mg Oral BID Aldean BakerSykes, Janet E, NP   500 mg at 01/20/20 1648  . multivitamin with minerals tablet 1 tablet  1 tablet Oral Daily Patrcia Dollyate, Tina L, FNP   1 tablet at 01/20/20 0815  . naltrexone (DEPADE) tablet 50 mg  50 mg Oral Daily Laveda AbbeParks, Laurie Britton, NP   50 mg at 01/20/20  0815  . nicotine (NICODERM CQ - dosed in mg/24 hours) patch 21 mg  21 mg Transdermal Daily Aldean Baker, NP   21 mg at 01/20/20 0818  . prazosin (MINIPRESS) capsule 1 mg  1 mg Oral QHS Aldean Baker, NP   1 mg at 01/19/20 2106  . propranolol (INDERAL) tablet 15 mg  15 mg Oral TID Aldean Baker, NP   15 mg at 01/20/20 1648  . thiamine tablet 100 mg  100 mg Oral Daily Patrcia Dolly, FNP   100 mg at 01/20/20 0815  . traZODone (DESYREL) tablet 100 mg  100 mg Oral QHS PRN Aldean Baker, NP   100 mg at 01/19/20 2108    Lab Results: No results found for this or any previous visit (from the past 48 hour(s)).  Blood Alcohol level:  Lab Results  Component Value Date   ETH 142 (H) 09/14/2019    Metabolic Disorder Labs: Lab Results  Component Value Date   HGBA1C 5.5 01/13/2020   MPG 111 01/13/2020   No results found for: PROLACTIN Lab Results  Component Value Date   CHOL 161 01/13/2020   TRIG 87 01/13/2020   HDL 70 01/13/2020   CHOLHDL 2.3 01/13/2020   VLDL 17 01/13/2020   LDLCALC 74 01/13/2020    Physical Findings: AIMS: Facial and Oral Movements Muscles of Facial Expression: None, normal Lips and Perioral Area: None, normal Jaw: None, normal Tongue: None, normal,Extremity Movements Upper (arms, wrists, hands, fingers): None, normal Lower (legs, knees, ankles, toes): None, normal, Trunk Movements Neck, shoulders, hips: None, normal, Overall Severity Severity of abnormal movements (highest score from questions above): None, normal Incapacitation due to abnormal movements: None, normal Patient's awareness of abnormal movements (rate only patient's report): No  Awareness, Dental Status Current problems with teeth and/or dentures?: No Does patient usually wear dentures?: No  CIWA:  CIWA-Ar Total: 1 COWS:     Musculoskeletal: Strength & Muscle Tone: within normal limits Gait & Station: normal Patient leans: N/A  Psychiatric Specialty Exam: Physical Exam Vitals and nursing note reviewed.  Constitutional:      Appearance: She is well-developed.  Cardiovascular:     Rate and Rhythm: Normal rate.  Pulmonary:     Effort: Pulmonary effort is normal.  Neurological:     Mental Status: She is alert and oriented to person, place, and time.     Review of Systems  Constitutional: Negative.   HENT: Positive for rhinorrhea.   Eyes: Positive for itching.  Respiratory: Negative for cough and shortness of breath.   Gastrointestinal: Negative for diarrhea, nausea and vomiting.  Genitourinary: Positive for vaginal discharge and vaginal pain. Negative for frequency and urgency.  Psychiatric/Behavioral: Negative for agitation, behavioral problems, confusion, decreased concentration, dysphoric mood, hallucinations, self-injury, sleep disturbance and suicidal ideas. The patient is not nervous/anxious and is not hyperactive.     Blood pressure 113/79, pulse 79, temperature 97.6 F (36.4 C), temperature source Oral, resp. rate 16, height 5\' 4"  (1.626 m), weight 80.3 kg, SpO2 99 %.Body mass index is 30.38 kg/m.  General Appearance: Casual  Eye Contact:  Good  Speech:  Normal Rate  Volume:  Normal  Mood:  Anxious and Depressed  Affect:  Congruent  Thought Process:  Coherent and Goal Directed  Orientation:  Full (Time, Place, and Person)  Thought Content:  Logical  Suicidal Thoughts:  No  Homicidal Thoughts:  No  Memory:  Immediate;   Good Recent;   Good  Judgement:  Intact  Insight:  Fair  Psychomotor Activity:  Normal  Concentration:  Concentration: Fair and Attention Span: Fair  Recall:  Good  Fund of Knowledge:  Fair  Language:  Good   Akathisia:  Yes  Handed:  Right  AIMS (if indicated):     Assets:  Communication Skills Desire for Improvement Social Support  ADL's:  Intact  Cognition:  WNL  Sleep:  Number of Hours: 4.5     Treatment Plan Summary: Daily contact with patient to assess and evaluate symptoms and progress in treatment and Medication management   Continue inpatient hospitalization.  2nd Pfizer COVID vaccine given yesterday continue propranolol to 15 mg PO TID for akathisia Continue  Abilify to 10 mg PO QHS for psychosis/mood stability Abilify Maintenna 400 mg IM was given  01/17/20 continue Flagyl 500 mg PO BID for BV Continue trazodone 100 mg PO QHS PRN insomnia Continue Mobic 7.5 mg PO daily for chronic pain Continue naltrexone 50 mg PO daily for opioid use disorder; patient interested in vivtrol, will discuss Continue thiamine 100 mg PO daily for supplementation Continue Minipress 1 mg PO QHS for nightmares  Discharge disposition in progress. Likely discharge tomorrow.   Patient will participate in the therapeutic group milieu.  Estella Husk, MD 01/20/2020, 5:25 PM

## 2020-01-20 NOTE — [facility] Group Notes (Signed)
Occupational Therapy Group Note Date: 01/20/2020 Group Topic/Focus: Communication Skills  Group Description: Group encouraged increased engagement and participation through discussion focused on identifying and 'breaking down barriers." Group members were encouraged to complete a worksheet and engage in discussion identifying barriers that get in the way of seeking treatment, asking for help, and managing our mental health.  Participation Level: Minimal   Participation Quality: Independent   Behavior: Calm and Cooperative   Speech/Thought Process: Directed   Affect/Mood: Constricted and Flat   Insight: Limited   Judgement: Limited   Individualization: Darlene Gates was in and out of group x 2 to use the bathroom and clean her room. When present, pt engaged socially, however did not discuss barriers that she is faced with/presented. Largely an observer, however appeared to listen attentively to peers' discussion.   Modes of Intervention: Activity, Discussion, Education, Socialization and Support  Patient Response to Interventions:  Attentive, Disengaged and Engaged   Plan: Continue to engage patient in OT groups 2 - 3x/week.  01/20/2020  Donne Hazel, MOT, OTR/L

## 2020-01-20 NOTE — [facility] Counselor (Signed)
Pt stated that she could not return to the [facility] but declined to share why she cannot return. CSW gave pt a list of homeless shelter resources and directed her to call them for bed openings.   Fredirick Lathe, LCSWA Clinicial Social Worker [facility]

## 2020-01-20 NOTE — [facility] Counselor (Signed)
CSW faxed application to [facility] for review for placement within their program.   Ruthann Cancer MSW, LCSW Clincal Social Worker  [facility]

## 2020-01-20 NOTE — Progress Notes (Signed)
Patient ID: Darlene Gates, female   DOB: Nov 09, 1985, 34 y.o.   MRN: [id number]   D: Patient calm and cooperative, denies SI/HI/AVH, visible on the unit interacting with others, reports that sleep quality last night was poor, appetite in last 24hrs has been far, reports a low energy level, reports good concentration, and denies being in any physical pain. Pt rates her depression today as "71/2" (10 being worst), rates her level of hopelessness as 0 (10 being worst), rates her anxiety level as "91/2" (10 being worst). Patient reports that her goal for today is "staying positive and coming with a plan", and states that she plans to accomplish this goal by "stay positive and research".  A: Patient being maintained on Q15 minute checks for safety, all meds being given as ordered.  R: Will continue to monitor on Q15 minute checks [place] NOVEL CORONAVIRUS (COVID-19) DAILY CHECK-OFF SYMPTOMS - answer yes or no to each - every day NO YES  Have you had a fever in the past 24 hours?  . Fever (Temp > 37.80C / 100F) X   Have you had any of these symptoms in the past 24 hours? . New Cough .  Sore Throat  .  Shortness of Breath .  Difficulty Breathing .  Unexplained Body Aches   X   Have you had any one of these symptoms in the past 24 hours not related to allergies?   . Runny Nose .  Nasal Congestion .  Sneezing   X   If you have had runny nose, nasal congestion, sneezing in the past 24 hours, has it worsened?  X   EXPOSURES - check yes or no X   Have you traveled outside the state in the past 14 days?  X   Have you been in contact with someone with a confirmed diagnosis of COVID-19 or PUI in the past 14 days without wearing appropriate PPE?  X   Have you been living in the same home as a person with confirmed diagnosis of COVID-19 or a PUI (household contact)?    X   Have you been diagnosed with COVID-19?    X              What to do next: Answered NO to all: Answered YES to anything:    Proceed with unit schedule Follow the [facility] Inpatient Flowsheet.

## 2020-01-21 ENCOUNTER — Telehealth (HOSPITAL_COMMUNITY): Payer: Medicaid Other | Admitting: Physician Assistant

## 2020-01-21 MED ORDER — NALTREXONE HCL 50 MG PO TABS
50.0000 mg | ORAL_TABLET | Freq: Every day | ORAL | 0 refills | Status: DC
Start: 2020-01-21 — End: 2020-02-16

## 2020-01-21 MED ORDER — METRONIDAZOLE 500 MG PO TABS
500.0000 mg | ORAL_TABLET | Freq: Two times a day (BID) | ORAL | 0 refills | Status: DC
Start: 2020-01-21 — End: 2020-02-16

## 2020-01-21 MED ORDER — HYDROXYZINE HCL 25 MG PO TABS
25.0000 mg | ORAL_TABLET | Freq: Three times a day (TID) | ORAL | 0 refills | Status: DC | PRN
Start: 2020-01-21 — End: 2020-02-16

## 2020-01-21 MED ORDER — NICOTINE 21 MG/24HR TD PT24
21.0000 mg | MEDICATED_PATCH | Freq: Every day | TRANSDERMAL | 0 refills | Status: DC
Start: 2020-01-21 — End: 2021-05-13

## 2020-01-21 MED ORDER — ARIPIPRAZOLE 10 MG PO TABS: 10.0000 mg | ORAL_TABLET | Freq: Every day | ORAL | 0 refills | Status: DC

## 2020-01-21 MED ORDER — PROPRANOLOL HCL 10 MG PO TABS: 15.0000 mg | ORAL_TABLET | Freq: Three times a day (TID) | ORAL | 0 refills | Status: DC

## 2020-01-21 MED ORDER — ARIPIPRAZOLE ER 400 MG IM SRER: 400.0000 mg | INTRAMUSCULAR | 0 refills | Status: DC

## 2020-01-21 MED ORDER — PRAZOSIN HCL 1 MG PO CAPS
1.0000 mg | ORAL_CAPSULE | Freq: Every day | ORAL | 0 refills | Status: DC
Start: 2020-01-21 — End: 2020-02-16

## 2020-01-21 MED ORDER — TRAZODONE HCL 100 MG PO TABS
100.0000 mg | ORAL_TABLET | Freq: Every evening | ORAL | 0 refills | Status: DC | PRN
Start: 2020-01-21 — End: 2020-02-16

## 2020-01-21 MED ORDER — ALBUTEROL SULFATE HFA 108 (90 BASE) MCG/ACT IN AERS: 1.0000 | INHALATION_SPRAY | Freq: Four times a day (QID) | RESPIRATORY_TRACT | 0 refills | Status: DC | PRN

## 2020-01-21 MED ORDER — LORATADINE 10 MG PO TABS: 10.0000 mg | ORAL_TABLET | Freq: Every day | ORAL | 0 refills | Status: DC

## 2020-01-21 MED ORDER — MELOXICAM 7.5 MG PO TABS: 7.5000 mg | ORAL_TABLET | Freq: Every day | ORAL | 0 refills | Status: DC

## 2020-01-21 NOTE — Tx Team (Signed)
Interdisciplinary Treatment and Diagnostic Plan Update  01/21/2020 Time of Session: 10:05am Darlene Gates MRN: [id number]  Principal Diagnosis: Bipolar 1 disorder, depressed, severe (HCC)  Secondary Diagnoses: Principal Problem:   Bipolar 1 disorder, depressed, severe (HCC) Active Problems:   Depression   Generalized anxiety disorder   Homelessness   Substance induced mood disorder (HCC)   Amphetamine abuse (HCC)   Alcohol dependence (HCC)   Opiate abuse, episodic (HCC)   Current Medications:  Current Facility-Administered Medications  Medication Dose Route Frequency Provider Last Rate Last Admin  . acetaminophen (TYLENOL) tablet 650 mg  650 mg Oral Q6H PRN Laveda Abbe, NP   650 mg at 01/19/20 4650  . albuterol (VENTOLIN HFA) 108 (90 Base) MCG/ACT inhaler 1-2 puff  1-2 puff Inhalation Q6H PRN Aldean Baker, NP      . alum & mag hydroxide-simeth (MAALOX/MYLANTA) 200-200-20 MG/5ML suspension 30 mL  30 mL Oral Q4H PRN Patrcia Dolly, FNP      . ARIPiprazole (ABILIFY) tablet 10 mg  10 mg Oral QHS Aldean Baker, NP   10 mg at 01/20/20 2117  . ARIPiprazole ER (ABILIFY MAINTENA) injection 400 mg  400 mg Intramuscular Q28 days Laveda Abbe, NP   400 mg at 01/16/20 1650  . hydrOXYzine (ATARAX/VISTARIL) tablet 25 mg  25 mg Oral TID PRN Patrcia Dolly, FNP   25 mg at 01/19/20 2106  . loratadine (CLARITIN) tablet 10 mg  10 mg Oral Daily Aldean Baker, NP   10 mg at 01/21/20 0758  . magnesium hydroxide (MILK OF MAGNESIA) suspension 30 mL  30 mL Oral Daily PRN Patrcia Dolly, FNP      . meloxicam (MOBIC) tablet 7.5 mg  7.5 mg Oral Daily Laveda Abbe, NP   7.5 mg at 01/21/20 0757  . metroNIDAZOLE (FLAGYL) tablet 500 mg  500 mg Oral BID Aldean Baker, NP   500 mg at 01/21/20 0757  . multivitamin with minerals tablet 1 tablet  1 tablet Oral Daily Patrcia Dolly, FNP   1 tablet at 01/21/20 0758  . naltrexone (DEPADE) tablet 50 mg  50 mg Oral Daily Laveda Abbe,  NP   50 mg at 01/21/20 0758  . nicotine (NICODERM CQ - dosed in mg/24 hours) patch 21 mg  21 mg Transdermal Daily Marciano Sequin E, NP   21 mg at 01/21/20 0800  . prazosin (MINIPRESS) capsule 1 mg  1 mg Oral QHS Aldean Baker, NP   1 mg at 01/20/20 2117  . propranolol (INDERAL) tablet 15 mg  15 mg Oral TID Aldean Baker, NP   15 mg at 01/21/20 0758  . thiamine tablet 100 mg  100 mg Oral Daily Patrcia Dolly, FNP   100 mg at 01/21/20 0758  . traZODone (DESYREL) tablet 100 mg  100 mg Oral QHS PRN Aldean Baker, NP   100 mg at 01/20/20 2117   PTA Medications: Medications Prior to Admission  Medication Sig Dispense Refill Last Dose  . ARIPiprazole (ABILIFY) 2 MG tablet Take 2 mg by mouth daily.   More than a month at Unknown time  . busPIRone (BUSPAR) 7.5 MG tablet Take 7.5 mg by mouth 2 (two) times daily.   More than a month at Unknown time  . gabapentin (NEURONTIN) 300 MG capsule Take 300 mg by mouth 3 (three) times daily.   More than a month at Unknown time  . gabapentin (NEURONTIN) 600 MG tablet Take 1  tablet (600 mg total) by mouth 3 (three) times daily.   More than a month at Unknown time  . hydrOXYzine (ATARAX/VISTARIL) 50 MG tablet Take 50 mg by mouth every 8 (eight) hours as needed for anxiety.    More than a month at Unknown time  . naproxen (NAPROSYN) 500 MG tablet Take 500 mg by mouth 2 (two) times daily.   More than a month at Unknown time  . OLANZapine zydis (ZYPREXA) 5 MG disintegrating tablet Take 1 tablet (5 mg total) by mouth 2 (two) times daily.   More than a month at Unknown time  . topiramate (TOPAMAX) 25 MG tablet Take 25 mg by mouth 2 (two) times daily.   More than a month at Unknown time  . venlafaxine (EFFEXOR) 75 MG tablet Take 75 mg by mouth 2 (two) times daily.   More than a month at Unknown time  . [DISCONTINUED] naltrexone (DEPADE) 50 MG tablet Take 50 mg by mouth daily.   More than a month at Unknown time  . [DISCONTINUED] prazosin (MINIPRESS) 1 MG capsule Take 1 mg by  mouth at bedtime.   More than a month at Unknown time    Patient Stressors: Financial difficulties Medication change or noncompliance Substance abuse  Patient Strengths: Motivation for treatment/growth Supportive family/friends  Treatment Modalities: Medication Management, Group therapy, Case management,  1 to 1 session with clinician, Psychoeducation, Recreational therapy.   Physician Treatment Plan for Primary Diagnosis: Bipolar 1 disorder, depressed, severe (HCC) Long Term Goal(s): Improvement in symptoms so as ready for discharge Improvement in symptoms so as ready for discharge   Short Term Goals: Ability to identify changes in lifestyle to reduce recurrence of condition will improve Ability to verbalize feelings will improve Ability to disclose and discuss suicidal ideas Ability to demonstrate self-control will improve Ability to identify and develop effective coping behaviors will improve Ability to maintain clinical measurements within normal limits will improve Compliance with prescribed medications will improve Ability to identify triggers associated with substance abuse/mental health issues will improve Ability to identify changes in lifestyle to reduce recurrence of condition will improve Ability to verbalize feelings will improve Ability to disclose and discuss suicidal ideas Ability to demonstrate self-control will improve Ability to identify and develop effective coping behaviors will improve Ability to maintain clinical measurements within normal limits will improve Compliance with prescribed medications will improve Ability to identify triggers associated with substance abuse/mental health issues will improve  Medication Management: Evaluate patient's response, side effects, and tolerance of medication regimen.  Therapeutic Interventions: 1 to 1 sessions, Unit Group sessions and Medication administration.  Evaluation of Outcomes: Adequate for  Discharge  Physician Treatment Plan for Secondary Diagnosis: Principal Problem:   Bipolar 1 disorder, depressed, severe (HCC) Active Problems:   Depression   Generalized anxiety disorder   Homelessness   Substance induced mood disorder (HCC)   Amphetamine abuse (HCC)   Alcohol dependence (HCC)   Opiate abuse, episodic (HCC)  Long Term Goal(s): Improvement in symptoms so as ready for discharge Improvement in symptoms so as ready for discharge   Short Term Goals: Ability to identify changes in lifestyle to reduce recurrence of condition will improve Ability to verbalize feelings will improve Ability to disclose and discuss suicidal ideas Ability to demonstrate self-control will improve Ability to identify and develop effective coping behaviors will improve Ability to maintain clinical measurements within normal limits will improve Compliance with prescribed medications will improve Ability to identify triggers associated with substance abuse/mental health issues  will improve Ability to identify changes in lifestyle to reduce recurrence of condition will improve Ability to verbalize feelings will improve Ability to disclose and discuss suicidal ideas Ability to demonstrate self-control will improve Ability to identify and develop effective coping behaviors will improve Ability to maintain clinical measurements within normal limits will improve Compliance with prescribed medications will improve Ability to identify triggers associated with substance abuse/mental health issues will improve     Medication Management: Evaluate patient's response, side effects, and tolerance of medication regimen.  Therapeutic Interventions: 1 to 1 sessions, Unit Group sessions and Medication administration.  Evaluation of Outcomes: Adequate for Discharge   RN Treatment Plan for Primary Diagnosis: Bipolar 1 disorder, depressed, severe (HCC) Long Term Goal(s): Knowledge of disease and therapeutic  regimen to maintain health will improve  Short Term Goals: Ability to demonstrate self-control, Ability to participate in decision making will improve and Ability to identify and develop effective coping behaviors will improve  Medication Management: RN will administer medications as ordered by provider, will assess and evaluate patient's response and provide education to patient for prescribed medication. RN will report any adverse and/or side effects to prescribing provider.  Therapeutic Interventions: 1 on 1 counseling sessions, Psychoeducation, Medication administration, Evaluate responses to treatment, Monitor vital signs and CBGs as ordered, Perform/monitor CIWA, COWS, AIMS and Fall Risk screenings as ordered, Perform wound care treatments as ordered.  Evaluation of Outcomes: Adequate for Discharge   LCSW Treatment Plan for Primary Diagnosis: Bipolar 1 disorder, depressed, severe (HCC) Long Term Goal(s): Safe transition to appropriate next level of care at discharge, Engage patient in therapeutic group addressing interpersonal concerns.  Short Term Goals: Engage patient in aftercare planning with referrals and resources, Increase ability to appropriately verbalize feelings, Facilitate patient progression through stages of change regarding substance use diagnoses and concerns and Identify triggers associated with mental health/substance abuse issues  Therapeutic Interventions: Assess for all discharge needs, 1 to 1 time with Social worker, Explore available resources and support systems, Assess for adequacy in community support network, Educate family and significant other(s) on suicide prevention, Complete Psychosocial Assessment, Interpersonal group therapy.  Evaluation of Outcomes: Adequate for Discharge   Progress in Treatment: Attending groups: Yes. Participating in groups: Yes. Taking medication as prescribed: Yes. Toleration medication: Yes. Family/Significant other contact  made: Yes, individual(s) contacted:  sister Patient understands diagnosis: Yes. Discussing patient identified problems/goals with staff: Yes. Medical problems stabilized or resolved: Yes. Denies suicidal/homicidal ideation: Yes. Issues/concerns per patient self-inventory: No.   New problem(s) identified: No, Describe:  none  New Short Term/Long Term Goal(s): medication stabilization, elimination of SI thoughts, development of comprehensive mental wellness plan.  Patient Goals:  "Depression, mental health, and go to rehab."  Discharge Plan or Barriers: Patient is to return to stay at [facility] and is to follow up with [facility] for longer term shelter and treatment.   Reason for Continuation of Hospitalization: Medication stabilization  Estimated Length of Stay: Adequate for discharge  Attendees: Patient:  01/21/2020 10:58 AM  Physician: 01/21/2020 10:58 AM  Nursing:  01/21/2020 10:58 AM  RN Care Manager: 01/21/2020 10:58 AM  Social Worker: Ruthann Cancer, LCSW 01/21/2020 10:58 AM  Recreational Therapist:  01/21/2020 10:58 AM  Other:  01/21/2020 10:58 AM  Other:  01/21/2020 10:58 AM  Other: 01/21/2020 10:58 AM    Scribe for Treatment Team: Otelia Santee, LCSW 01/21/2020 10:58 AM

## 2020-01-21 NOTE — Progress Notes (Signed)
Patient was cooperative with treatment, she remains sad and depressed. She spent most ofthe evening in the dayroom talking with peers.She denies SI &AVH. She was medication complaint and she is currently resting in bed quietly. No issues to report on shift at this time.

## 2020-01-21 NOTE — Progress Notes (Signed)
RN met with pt and reviewed pt's discharge instructions.  Pt verbalized understanding of discharge instructions and pt did not have any questions. RN reviewed and provided pt with a copy of SRA, AVS and Transition Record.  RN returned pt's belongings to pt.  Prescriptions and samples were given to pt.  Pt denied SI/HI/AVH and voiced no concerns.  Pt was appreciative of the care pt received at [facility].  Patient discharged to the lobby without incident.
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
## 2020-01-21 NOTE — Progress Notes (Signed)
  [facility] Adult Case Management Discharge Plan :  Will you be returning to the same living situation after discharge:  Yes,  [facility] At discharge, do you have transportation home?: No. Will contact safe transport Do you have the ability to pay for your medications: Yes,  has medicaid  Release of information consent forms completed and in the chart;  Patient's signature needed at discharge.  Patient to Follow up at:  Follow-up Information    [facility] Go on 02/02/2020.   Specialty: Professional Counselor Why: You have an appointment on 02/02/20 at 6:00 pm for therapy.   This appointment will be held in person. Contact information: [facility] [place] [address] [place] [address] [phone]        [facility] Follow up.   Contact information: [address] [place] [address] [phone]        [facility] Follow up.   Specialty: Addiction Medicine Contact information: [address] [place] [address] [phone]        [facility] Follow up on 02/11/2020.   Specialty: Behavioral Health Why: You have an appointment for medication management on 02/11/20 at 10:00 am.  This will be a Virtual appointment.  Contact information: [address] [place] [address] [phone]       [facility]. Go on 02/03/2020.   Why: Go to appt at 11:10am to follow-up with primary care provider regarding lack of menstural cycle Contact information: [address] [place] [address] [phone]       [facility] .   Specialty: [facility] information: [address] [place] [address] [phone]       [facility] Follow up.   Why: call and continue to follow-up on  referral Contact information: [address] [address] [place], [place] [address] Phone: [phone] Fax: [phone]              Next level of care provider has access to [facility] Link:no  Safety Planning and Suicide Prevention discussed: Sunday Spillers [phone] (Sister)  Have you used any form of tobacco in the last 30 days? (Cigarettes, Smokeless Tobacco, Cigars, and/or Pipes): Yes  Has patient been referred to the Quitline?: Patient refused referral  Patient has been referred for addiction treatment: Yes  Felizardo Hoffmann, LCSWA 01/21/2020, 9:36 AM
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
## 2020-01-25 DIAGNOSIS — Z03818 Encounter for observation for suspected exposure to other biological agents ruled out: Secondary | ICD-10-CM | POA: Diagnosis not present

## 2020-01-26 DIAGNOSIS — F259 Schizoaffective disorder, unspecified: Secondary | ICD-10-CM | POA: Diagnosis not present

## 2020-02-03 ENCOUNTER — Other Ambulatory Visit: Payer: Self-pay

## 2020-02-03 ENCOUNTER — Ambulatory Visit: Payer: Medicaid Other | Attending: Physician Assistant | Admitting: Physician Assistant

## 2020-02-08 DIAGNOSIS — Z03818 Encounter for observation for suspected exposure to other biological agents ruled out: Secondary | ICD-10-CM | POA: Diagnosis not present

## 2020-02-11 ENCOUNTER — Telehealth (HOSPITAL_COMMUNITY): Payer: Medicaid Other | Admitting: Physician Assistant

## 2020-02-16 ENCOUNTER — Encounter: Payer: Self-pay | Admitting: Critical Care Medicine

## 2020-02-16 ENCOUNTER — Other Ambulatory Visit: Payer: Self-pay | Admitting: Critical Care Medicine

## 2020-02-16 ENCOUNTER — Other Ambulatory Visit (HOSPITAL_COMMUNITY): Payer: Self-pay | Admitting: Critical Care Medicine

## 2020-02-16 MED ORDER — NALTREXONE HCL 50 MG PO TABS: 50.0000 mg | ORAL_TABLET | Freq: Every day | ORAL | 0 refills | Status: DC

## 2020-02-16 MED ORDER — ARIPIPRAZOLE 10 MG PO TABS
10.0000 mg | ORAL_TABLET | Freq: Every day | ORAL | 0 refills | Status: DC
Start: 2020-02-16 — End: 2020-03-14

## 2020-02-16 MED ORDER — VALACYCLOVIR HCL 500 MG PO TABS: 500.0000 mg | ORAL_TABLET | Freq: Two times a day (BID) | ORAL | 0 refills | Status: DC

## 2020-02-16 MED ORDER — MELOXICAM 7.5 MG PO TABS
7.5000 mg | ORAL_TABLET | Freq: Every day | ORAL | 0 refills | Status: DC
Start: 2020-02-16 — End: 2021-05-13

## 2020-02-16 MED ORDER — PRAZOSIN HCL 1 MG PO CAPS: 1.0000 mg | ORAL_CAPSULE | Freq: Every day | ORAL | 0 refills | Status: DC

## 2020-02-16 MED ORDER — METRONIDAZOLE 500 MG PO TABS: 500.0000 mg | ORAL_TABLET | Freq: Two times a day (BID) | ORAL | 0 refills | Status: DC

## 2020-02-16 MED ORDER — TRAZODONE HCL 100 MG PO TABS: 100.0000 mg | ORAL_TABLET | Freq: Every evening | ORAL | 0 refills | Status: DC | PRN

## 2020-02-16 MED ORDER — HYDROXYZINE HCL 25 MG PO TABS: 25.0000 mg | ORAL_TABLET | Freq: Three times a day (TID) | ORAL | 0 refills | Status: DC | PRN

## 2020-02-16 MED ORDER — PROPRANOLOL HCL 20 MG PO TABS
20.0000 mg | ORAL_TABLET | Freq: Three times a day (TID) | ORAL | 0 refills | Status: DC
Start: 2020-02-16 — End: 2020-02-17

## 2020-02-16 MED FILL — traZODone HCL 100 MG TABS: 100 | 30 days supply | Qty: 30 | Fill #0

## 2020-02-16 MED FILL — hydrOXYzine HCL 25 MG TABS: 25 | 10 days supply | Qty: 30 | Fill #0

## 2020-02-16 MED FILL — metroNIDAZOLE 500 MG TABS: 500 | 5 days supply | Qty: 10 | Fill #0

## 2020-02-16 MED FILL — ARIPIPRAZOLE 10 MG TABS: 10 | 30 days supply | Qty: 30 | Fill #0

## 2020-02-16 MED FILL — MELOXICAM 7.5 MG TABLET: 7.5 | 30 days supply | Qty: 30 | Fill #0

## 2020-02-16 MED FILL — PRAZOSIN 1 MG CAPSULE: 1 | 30 days supply | Qty: 30 | Fill #0

## 2020-02-16 MED FILL — VALACYCLOVIR HCL 500 MG TAB: 500 | 7 days supply | Qty: 14 | Fill #0

## 2020-02-16 MED FILL — NALTREXONE 50 MG TABLET: 50 | 30 days supply | Qty: 30 | Fill #0

## 2020-02-17 ENCOUNTER — Other Ambulatory Visit (HOSPITAL_COMMUNITY): Payer: Self-pay | Admitting: Critical Care Medicine

## 2020-02-17 ENCOUNTER — Other Ambulatory Visit: Payer: Self-pay | Admitting: Critical Care Medicine

## 2020-02-17 MED ORDER — PROPRANOLOL HCL 20 MG PO TABS
20.0000 mg | ORAL_TABLET | Freq: Two times a day (BID) | ORAL | 0 refills | Status: DC
Start: 2020-02-17 — End: 2021-05-21

## 2020-02-17 MED FILL — PROPRANOLOL 20 MG TABLET: 20 | 30 days supply | Qty: 60 | Fill #0

## 2020-02-17 NOTE — Progress Notes (Signed)
This patient was seen today in the [facility] clinic she has been a resident here since August of this year.  This patient had a recent admission to behavioral health and was given medications at discharge which she is unable to afford.  She brought her printed prescriptions in to review with me to see if we could provide medication bridging until she sees her behavioral health provider in follow-up along with her primary care provider.  She has appointments in December with behavioral health and she actually has an appointment this week with Dr. Allena Katz of family practice  Her past history includes that of substance-induced mood disorder, bipolar, generalized anxiety, recent history of amphetamine use, alcohol dependence, and opiate use.  She also has upcoming appointment with the step program to help with her dependence addictions.  She states she drinks about 1-2 beers a day but is now going to AA and is committed to quitting alcohol.  She has complaints of irritable bowel syndrome type complaints in the stomach she is concerned about recurrent herpes outbreaks and complains of vaginal discharge.  She has a prescription for Flagyl which she has yet to fill.  She complains of chronic foot and back pain as well.  She is needing all these medications refilled.  On exam blood pressure is 142/80 pulse is 94 saturation 99% on room air  On exam the foot and back are examined and unremarkable  Impression is that the patient needs medication bridging until she can see her providers  Plan will be to provide refills on Abilify 10 mg daily, hydroxyzine 25 mg 3 times daily as needed, meloxicam 7.5 mg daily for foot and back pain, Flagyl 500 mg twice daily to complete her antibiotic course 10 tablets given, naltrexone 50 mg daily for alcohol and opiate prevention, prazosin 1 mg at bedtime for nightmares, propranolol 20 mg twice daily, trazodone 100 mg at bedtime as needed for sleep, Valtrex 500 mg twice daily for  7-day course  She is to follow-up with her primary care physician for this week's visit

## 2020-02-21 DIAGNOSIS — Z03818 Encounter for observation for suspected exposure to other biological agents ruled out: Secondary | ICD-10-CM | POA: Diagnosis not present

## 2020-03-07 DIAGNOSIS — Z03818 Encounter for observation for suspected exposure to other biological agents ruled out: Secondary | ICD-10-CM | POA: Diagnosis not present

## 2020-03-14 ENCOUNTER — Other Ambulatory Visit: Payer: Self-pay

## 2020-03-14 ENCOUNTER — Encounter: Payer: Self-pay | Admitting: Family Medicine

## 2020-03-14 ENCOUNTER — Ambulatory Visit (INDEPENDENT_AMBULATORY_CARE_PROVIDER_SITE_OTHER): Payer: Medicaid Other | Admitting: Family Medicine

## 2020-03-14 VITALS — BP 110/62 | HR 104 | Ht 64.0 in | Wt 180.0 lb

## 2020-03-14 DIAGNOSIS — N912 Amenorrhea, unspecified: Secondary | ICD-10-CM

## 2020-03-14 DIAGNOSIS — F314 Bipolar disorder, current episode depressed, severe, without psychotic features: Secondary | ICD-10-CM | POA: Diagnosis not present

## 2020-03-14 DIAGNOSIS — N926 Irregular menstruation, unspecified: Secondary | ICD-10-CM

## 2020-03-14 DIAGNOSIS — Z7251 High risk heterosexual behavior: Secondary | ICD-10-CM

## 2020-03-14 DIAGNOSIS — Z789 Other specified health status: Secondary | ICD-10-CM | POA: Diagnosis not present

## 2020-03-14 LAB — POCT URINE PREGNANCY: Preg Test, Ur: NEGATIVE

## 2020-03-14 MED ORDER — ARIPIPRAZOLE 10 MG PO TABS: 10.0000 mg | ORAL_TABLET | Freq: Every day | ORAL | 0 refills | Status: DC

## 2020-03-14 NOTE — Patient Instructions (Addendum)
Thank you for coming to see me today. It was a pleasure. Please call this number and book a follow up with your psychiatrist.    [facility] Follow up on 02/11/2020.   Specialty: Behavioral Health Why: You have an appointment for medication management on 02/11/20 at 10:00 am.  This will be a Virtual appointment.  Contact information: [address] [place] [address] [phone]  We will get some labs today.  If they are abnormal or we need to do something about them, I will call you.  If they are normal, I will send you a message on MyChart (if it is active) or a letter in the mail.  If you don't hear from [place] in 2 weeks, please call the office at the number below.   Please follow-up with me in the next 1-2 weeks.   If you have any questions or concerns, please do not hesitate to call the office at [phone].  If you develop fevers>100.5, shortness of breath, chest pain, palpitations, dizziness, abdominal pain, nausea, vomiting, diarrhea or cannot eat or drink then please go to the ER immediately.  Best wishes,   Dr Allena Katz
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
## 2020-03-14 NOTE — Assessment & Plan Note (Signed)
U preg negative today. Most likely cause of ammenorhea is side effect from depo. It can take several months to return to regular menstrual cycles.Unlikely hypothyroid as causes TSH 0.99 on 10/19.  Also considered anti-psychotics as cause however patient reports non compliance with her psych medications so this is less likely. I did not explore cause of amennorhea in greater detail today as most of this encounter was spent talking about her psychiatric problems which was clearly a priority today. See below. Patient will follow up with me for follow up where we will do pap, std testing and explore other causes of amenorrha.

## 2020-03-15 ENCOUNTER — Encounter: Payer: Self-pay | Admitting: Family Medicine

## 2020-03-15 DIAGNOSIS — Z7251 High risk heterosexual behavior: Secondary | ICD-10-CM | POA: Insufficient documentation

## 2020-03-15 DIAGNOSIS — Z789 Other specified health status: Secondary | ICD-10-CM | POA: Insufficient documentation

## 2020-03-15 LAB — BASIC METABOLIC PANEL
BUN/Creatinine Ratio: 9 (ref 9–23)
BUN: 8 mg/dL (ref 6–20)
CO2: 23 mmol/L (ref 20–29)
Calcium: 9.7 mg/dL (ref 8.7–10.2)
Chloride: 102 mmol/L (ref 96–106)
Creatinine, Ser: 0.93 mg/dL (ref 0.57–1.00)
GFR calc Af Amer: 93 mL/min/{1.73_m2} (ref >59)
GFR calc non Af Amer: 80 mL/min/{1.73_m2} (ref >59)
Glucose: 72 mg/dL (ref 65–99)
Potassium: 4.3 mmol/L (ref 3.5–5.2)
Sodium: 140 mmol/L (ref 134–144)

## 2020-03-15 LAB — RPR: RPR Ser Ql: NONREACTIVE

## 2020-03-15 LAB — HEPATITIS B SURFACE ANTIBODY, QUANTITATIVE: Hepatitis B Surf Ab Quant: >1000 m[IU]/mL (ref >9.9)

## 2020-03-15 LAB — HEPATITIS C ANTIBODY: Hep C Virus Ab: <0.1 s/co ratio (ref 0.0–0.9)

## 2020-03-15 LAB — HIV ANTIBODY (ROUTINE TESTING W REFLEX): HIV Screen 4th Generation wRfx: NONREACTIVE

## 2020-03-15 NOTE — Assessment & Plan Note (Addendum)
Patient has been TTC the majority of this year after coming off the depo. She is not taking prenatal vitamins. I do feel she is too unstable emotionally to TTC. To add she has not taken her psych medications since her hospital discharge and has not followed up to any of the [facility] services. Furthermore she has a 34 year old and 14 year who are not in her care. I had an honest discussion with the patient about this and feel this should be postponed until her mental health is better controlled. Patient is in agreement.
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
## 2020-03-15 NOTE — Assessment & Plan Note (Addendum)
PHQ 9 15 today. Denies active or passive SI. I am concerned Darlene Gates's mental health. She endorses poor medication compliance and did not follow up to any of her [facility] appointments following discharge from hospital October. Reinforced the importance of following up and taking psych medications to prevent relapse of bipolar disorder/depression. Provided [facility] telephone number to arrange follow up appointment. Refilled Abilify today. Follow up in January.

## 2020-03-20 DIAGNOSIS — Z03818 Encounter for observation for suspected exposure to other biological agents ruled out: Secondary | ICD-10-CM | POA: Diagnosis not present

## 2020-04-04 DIAGNOSIS — Z03818 Encounter for observation for suspected exposure to other biological agents ruled out: Secondary | ICD-10-CM | POA: Diagnosis not present

## 2020-04-08 DIAGNOSIS — Z124 Encounter for screening for malignant neoplasm of cervix: Secondary | ICD-10-CM | POA: Insufficient documentation

## 2020-04-08 NOTE — Assessment & Plan Note (Signed)
Obtained wet prep today including GC/ chlamydia.

## 2020-04-08 NOTE — Progress Notes (Signed)
     SUBJECTIVE:   CHIEF COMPLAINT / HPI:   Darlene Gates is a 35 y.o. female presents for follow up for STD screen and pap.  Husband also present at this visit.  STD screen Wants STD screen due high risk sexual behavior recently.   Psychiatric issues Admitted and discharged from [facility] for suicidal ideation and drug detox (crack, cocaine and EOTH) on 01/21/20. Unfortunately she did not follow up to any of hospital appointments. At last appointment PHQ 9 15. I strongly encouraged pt to follow up with all BH follow up. Since then she has booked appointment with [facility] classes for substance abuse to start soon. Denies low mood, SI thoughts of self harm. Living in motel right now with her husband. The shelter moved them in there.  Pap Overdue   PERTINENT  PMH / PSH: substance misuse disorder, bipolar disorder, anxiety    OBJECTIVE:   BP 108/60   Pulse 90   Ht 5\' 4"  (1.626 m)   Wt 181 lb 6.4 oz (82.3 kg)   SpO2 98%   BMI 31.14 kg/m    General: Alert, no acute distress, well appearing, well kempt Cardio: well perfused Pulm: normal work of breathing Extremities: No peripheral edema.  Neuro: Cranial nerves grossly intact  Psych: normal thought content, normal mood, normal affect   Pelvic Exam chaperoned by CMA April         External: normal female genitalia without lesions or masses        Vagina: normal without lesions or masses        Cervix: normal without lesions or masses        Pap smear: performed        Samples for Wet prep, GC/Chlamydia obtained  ASSESSMENT/PLAN:   Cervical cancer screening Obtained pap today.   High risk sexual behavior Obtained wet prep today including GC/ chlamydia.   Bipolar 1 disorder, depressed, severe (HCC) Doing much better today compared to previous visit. Denies active or passive SI. She has restarted all of her pysch medications since her last visit. She is waiting for appointment with [facility] and substance  abuse. She appear to be well motivated and responds well to encouragement. I congratulated her on her efforts since the last visit and told her to keep up the great work. Follow up with me 05/10/20.     07/08/20, MD PGY-2  [facility]

## 2020-04-08 NOTE — Assessment & Plan Note (Signed)
Obtained pap today.

## 2020-04-12 ENCOUNTER — Encounter: Payer: Self-pay | Admitting: Family Medicine

## 2020-04-12 ENCOUNTER — Other Ambulatory Visit (HOSPITAL_COMMUNITY)
Admission: RE | Admit: 2020-04-12 | Discharge: 2020-04-12 | Disposition: A | Discharge status: Home / Self Care | Payer: Medicaid Other | Source: Ambulatory Visit | Attending: Family Medicine | Admitting: Family Medicine

## 2020-04-12 ENCOUNTER — Ambulatory Visit (INDEPENDENT_AMBULATORY_CARE_PROVIDER_SITE_OTHER): Payer: Medicaid Other | Admitting: Family Medicine

## 2020-04-12 ENCOUNTER — Other Ambulatory Visit: Payer: Self-pay

## 2020-04-12 VITALS — BP 108/60 | HR 90 | Ht 64.0 in | Wt 181.4 lb

## 2020-04-12 DIAGNOSIS — Z113 Encounter for screening for infections with a predominantly sexual mode of transmission: Secondary | ICD-10-CM

## 2020-04-12 DIAGNOSIS — Z7251 High risk heterosexual behavior: Secondary | ICD-10-CM

## 2020-04-12 DIAGNOSIS — F314 Bipolar disorder, current episode depressed, severe, without psychotic features: Secondary | ICD-10-CM

## 2020-04-12 DIAGNOSIS — Z124 Encounter for screening for malignant neoplasm of cervix: Secondary | ICD-10-CM | POA: Insufficient documentation

## 2020-04-12 NOTE — Patient Instructions (Addendum)
Thank you for coming to see me today. It was a pleasure. I am so glad your mental health is better controlled. Congratulations I am proud of you!  We performed STD testing today. This will take a few days to come back. If your MyChart is activated, we will message you on there if everything is normal, otherwise we will call. If we need to treat something we will also call you. If you do not hear from [place] in the next 4 days, please give [place] a call.  If you have any questions or concerns, please do not hesitate to call the office at [phone].  If you develop fevers>100.5, shortness of breath, chest pain, palpitations, dizziness, abdominal pain, nausea, vomiting, diarrhea or cannot eat or drink then please go to the ER immediately.  Best wishes,   Dr Allena Katz

## 2020-04-13 ENCOUNTER — Telehealth: Payer: Self-pay | Admitting: Family Medicine

## 2020-04-13 ENCOUNTER — Other Ambulatory Visit: Payer: Self-pay | Admitting: Family Medicine

## 2020-04-13 LAB — CERVICOVAGINAL ANCILLARY ONLY
Bacterial Vaginitis (gardnerella): POSITIVE — AB
Candida Glabrata: NEGATIVE
Candida Vaginitis: NEGATIVE
Chlamydia: NEGATIVE
Comment: NEGATIVE
Comment: NEGATIVE
Comment: NEGATIVE
Comment: NEGATIVE
Comment: NEGATIVE
Comment: NORMAL
Neisseria Gonorrhea: NEGATIVE
Trichomonas: POSITIVE — AB

## 2020-04-13 MED ORDER — METRONIDAZOLE 500 MG PO TABS: 500.0000 mg | ORAL_TABLET | Freq: Two times a day (BID) | ORAL | 0 refills | Status: AC

## 2020-04-13 NOTE — Telephone Encounter (Signed)
Informed pt she has BV and trichomonas. Sent in 7 day supple of flagyl 500mg  BID. Told her to notify all her recent sexual partners that she has an STD, avoid sex and ETOH for the next week. Provided safe sex counseling.

## 2020-04-13 NOTE — Assessment & Plan Note (Addendum)
Doing much better today compared to previous visit. Denies active or passive SI. She has restarted all of her pysch medications since her last visit. She is waiting for appointment with [facility] and substance abuse. She appear to be well motivated and responds well to encouragement. I congratulated her on her efforts since the last visit and told her to keep up the great work. Follow up with me 05/10/20.

## 2020-04-14 LAB — CYTOLOGY - PAP
Comment: NEGATIVE
Diagnosis: NEGATIVE
High risk HPV: NEGATIVE

## 2020-04-18 DIAGNOSIS — Z03818 Encounter for observation for suspected exposure to other biological agents ruled out: Secondary | ICD-10-CM | POA: Diagnosis not present

## 2020-05-01 DIAGNOSIS — F4310 Post-traumatic stress disorder, unspecified: Secondary | ICD-10-CM | POA: Diagnosis not present

## 2020-05-02 DIAGNOSIS — Z03818 Encounter for observation for suspected exposure to other biological agents ruled out: Secondary | ICD-10-CM | POA: Diagnosis not present

## 2020-05-09 NOTE — Progress Notes (Deleted)
     SUBJECTIVE:   CHIEF COMPLAINT / HPI:   Darlene Gates is a 35 y.o. female presents for follow up  Bipolar disorder  Irregular Periods Age of menarche: *** Menstrual pattern: *** Change in menstrual pattern: *** If change, events around change (weight loss, stress, intensive exercise): *** Pain with periods: ***  Sexual Activity: *** LMP: ***  Contraception: ***  Hx of STIs: trich No Hx DM, liver disease, renal disease, SLE *** No use of anticoagulation, antidepressants, or antipsychotics *** No history of bleeding gums, easy bruising, bleeding disorder *** No dizziness upon standing *** No large changes in weight *** No abdominal pain, fever, vaginal discharge *** No dyspareunia*** No galactorrhea, heat or col intolerance, visual changes, headaches, hirsutism/acne *** No changes in bowel or bladder function *** No history of hypermobility *** No changes in hair, skin, or nails ***  No family history of bleeding disorders, menstrual problems, DM, thyroid disorders, cancers Stressors: ***   Initial evaluation should have pregnancy test, CBC, TSH, STI screening  Normal periods are 21-45 days apart and 2-7 days of bleeding  PERTINENT  PMH / PSH:   OBJECTIVE:   There were no vitals taken for this visit.   General: Alert, no acute distress Cardio: Normal S1 and S2, RRR, no r/m/g Pulm: CTAB, normal work of breathing Abdomen: Bowel sounds normal. Abdomen soft and non-tender.  Extremities: No peripheral edema.  Neuro: Cranial nerves grossly intact   ASSESSMENT/PLAN:   No problem-specific Assessment & Plan notes found for this encounter.     Towanda Octave, MD PGY-2 [facility]

## 2020-05-10 ENCOUNTER — Ambulatory Visit: Payer: Medicaid Other | Admitting: Family Medicine

## 2020-05-15 DIAGNOSIS — Z5181 Encounter for therapeutic drug level monitoring: Secondary | ICD-10-CM | POA: Diagnosis not present

## 2020-05-16 DIAGNOSIS — Z03818 Encounter for observation for suspected exposure to other biological agents ruled out: Secondary | ICD-10-CM | POA: Diagnosis not present

## 2020-05-30 DIAGNOSIS — Z03818 Encounter for observation for suspected exposure to other biological agents ruled out: Secondary | ICD-10-CM | POA: Diagnosis not present

## 2020-08-21 ENCOUNTER — Encounter: Payer: Medicaid Other | Admitting: Family Medicine

## 2020-08-21 NOTE — Progress Notes (Deleted)
    SUBJECTIVE:   CHIEF COMPLAINT / HPI:   STD Testing  Patient presenting today for STD testing. She is currently sexually active with {rksexual partner:25472::"males "}. Reports *** history of STDS.  Contraception: {Contraceptives:21111124} Denies any discharge, genital lesions, dysuria, dyspareunia.  No LMP recorded.. Last pap was ***.  {rkdeniesreports:25473} partner violence and reports feeling safe in relationships.  PERTINENT  PMH / PSH: ***No history of abnormal pap smears.   OBJECTIVE:  There were no vitals taken for this visit.  General: well appearing, NAD GU: External vulva and vagina nonerythematous, without any obvious lesions or rash.  *** discharge appreciated. {Discharge: Color, consistency, odor.} Normal ruggae of vaginal walls.  Cervix is non erythematous and non-friable.  There is no cervical motion tenderness, masses or gross abnormalities appreciated during bimanual exam.   ASSESSMENT/PLAN:   No problem-specific Assessment & Plan notes found for this encounter.    Obtained GC/C, wet prep, HIV, RPR. Follow up with results.  ***Counseled on contraception. Patient to make follow up appointment.    Melene Plan, MD [facility]   {    This will disappear when note is signed, click to select method of visit    :1}

## 2020-08-22 NOTE — Progress Notes (Signed)
This encounter was created in error - please disregard.

## 2020-08-23 ENCOUNTER — Other Ambulatory Visit: Payer: Self-pay

## 2020-08-23 ENCOUNTER — Encounter: Payer: Self-pay | Admitting: Family Medicine

## 2020-08-23 ENCOUNTER — Other Ambulatory Visit (HOSPITAL_COMMUNITY)
Admission: RE | Admit: 2020-08-23 | Discharge: 2020-08-23 | Disposition: A | Discharge status: Home / Self Care | Payer: Medicaid Other | Source: Ambulatory Visit | Attending: Family Medicine | Admitting: Family Medicine

## 2020-08-23 ENCOUNTER — Ambulatory Visit (INDEPENDENT_AMBULATORY_CARE_PROVIDER_SITE_OTHER): Payer: Medicaid Other

## 2020-08-23 ENCOUNTER — Ambulatory Visit (INDEPENDENT_AMBULATORY_CARE_PROVIDER_SITE_OTHER): Payer: Medicaid Other | Admitting: Family Medicine

## 2020-08-23 VITALS — BP 112/75 | HR 89 | Ht 64.0 in | Wt 164.4 lb

## 2020-08-23 DIAGNOSIS — R822 Biliuria: Secondary | ICD-10-CM | POA: Diagnosis not present

## 2020-08-23 DIAGNOSIS — Z23 Encounter for immunization: Secondary | ICD-10-CM | POA: Diagnosis not present

## 2020-08-23 DIAGNOSIS — Z3009 Encounter for other general counseling and advice on contraception: Secondary | ICD-10-CM

## 2020-08-23 DIAGNOSIS — Z113 Encounter for screening for infections with a predominantly sexual mode of transmission: Secondary | ICD-10-CM | POA: Diagnosis not present

## 2020-08-23 LAB — POCT URINALYSIS DIP (MANUAL ENTRY)
Glucose, UA: NEGATIVE mg/dL
Leukocytes, UA: NEGATIVE
Nitrite, UA: NEGATIVE
Protein Ur, POC: NEGATIVE mg/dL
Spec Grav, UA: >=1.03 — AB (ref 1.010–1.025)
Urobilinogen, UA: 0.2 E.U./dL
pH, UA: 6 (ref 5.0–8.0)

## 2020-08-23 LAB — POCT UA - MICROSCOPIC ONLY

## 2020-08-23 LAB — POCT WET PREP (WET MOUNT): Clue Cells Wet Prep Whiff POC: POSITIVE

## 2020-08-23 MED ORDER — METRONIDAZOLE 500 MG PO TABS: 500.0000 mg | ORAL_TABLET | Freq: Two times a day (BID) | ORAL | 0 refills | Status: DC

## 2020-08-23 NOTE — Progress Notes (Addendum)
    SUBJECTIVE:   CHIEF COMPLAINT / HPI:   STD Testing  Patient presenting today for STD testing. She is currently sexually active. Reports history of STDS.  Contraception: None, counseled.  Denies any genital lesions, dysuria, dyspareunia. She is having vaginal discharge, odor and itching.  Patient's last menstrual period was 08/07/2020 (approximate).. Last pap was 04/12/20.  Denies partner violence and reports feeling safe in relationships.  PERTINENT  PMH / PSH: Bipolar, polysubstance abuse  OBJECTIVE:  BP 112/75   Pulse 89   Ht 5\' 4"  (1.626 m)   Wt 164 lb 6.4 oz (74.6 kg)   LMP 08/07/2020 (Approximate)   SpO2 98%   BMI 28.22 kg/m   General: well appearing, NAD GU: External vulva and vagina nonerythematous, without any obvious lesions or rash.  white, creamy, mild clumpy discharge appreciated.  Normal ruggae of vaginal walls.  Cervix is non erythematous and non-friable.  There is no cervical motion tenderness, masses or gross abnormalities appreciated during bimanual exam.   ASSESSMENT/PLAN:   Screening examination for STD (sexually transmitted disease) Obtained GC/C, wet prep, HIV, RPR. Wet prep s/f BV and trichomonas. Called patient (both mobile numbers two times each) and LVM stating she should call 10/07/2020 back to review results and that medication was sent to the pharmacy. Will keep trying to reach patient. Provided information for MyChart sign up today. Follow up with other results.   General counseling and advice on female contraception Counseled on contraception. She is not wanting to get pregnant at this time as she has a lot of social issues. We will get a UPT today per patient request. Urine obtained; however, incorrect order placed for UA and reflex urine microscopy. Will make patient aware when able to get a hold of her.    [place], MD [facility]    ADDENDUM  Spoke to patient over the phone about results of wet prep. Metronidazole sent to  preferred pharmacy. Still awaiting other results. Patient notified about UPT not being run. She will come in for the test at her earliest convenience. Future UPT order placed.  Melene Plan, M.D.  9:11 AM 08/24/2020

## 2020-08-23 NOTE — Assessment & Plan Note (Addendum)
Counseled on contraception. She is not wanting to get pregnant at this time as she has a lot of social issues. We will get a UPT today per patient request. Urine obtained; however, incorrect order placed for UA and reflex urine microscopy. Will make patient aware when able to get a hold of her.

## 2020-08-23 NOTE — Assessment & Plan Note (Addendum)
Obtained GC/C, wet prep, HIV, RPR. Wet prep s/f BV and trichomonas. Called patient (both mobile numbers two times each) and LVM stating she should call us back to review results and that medication was sent to the pharmacy. Will keep trying to reach patient. Provided information for MyChart sign up today. Follow up with other results.

## 2020-08-24 ENCOUNTER — Telehealth: Payer: Self-pay

## 2020-08-24 ENCOUNTER — Other Ambulatory Visit: Payer: Self-pay

## 2020-08-24 LAB — RPR: RPR Ser Ql: NONREACTIVE

## 2020-08-24 LAB — CERVICOVAGINAL ANCILLARY ONLY
Chlamydia: NEGATIVE
Comment: NEGATIVE
Comment: NORMAL
Neisseria Gonorrhea: NEGATIVE

## 2020-08-24 LAB — HIV ANTIBODY (ROUTINE TESTING W REFLEX): HIV Screen 4th Generation wRfx: NONREACTIVE

## 2020-08-24 MED ORDER — METRONIDAZOLE 500 MG PO TABS: 500.0000 mg | ORAL_TABLET | Freq: Two times a day (BID) | ORAL | 0 refills | Status: AC

## 2020-08-24 NOTE — Addendum Note (Signed)
Addended by: Melene Plan on: 08/24/2020 09:11 AM   Modules accepted: Orders

## 2020-08-24 NOTE — Addendum Note (Signed)
Addended by: Melene Plan on: 08/24/2020 06:12 PM   Modules accepted: Orders

## 2020-08-24 NOTE — Telephone Encounter (Signed)
Patient calls nurse line returning Dr. Reuel Derby call about results. Please advise.

## 2020-08-24 NOTE — Progress Notes (Signed)
Disregard

## 2020-09-04 DIAGNOSIS — Z20822 Contact with and (suspected) exposure to covid-19: Secondary | ICD-10-CM | POA: Diagnosis not present

## 2020-09-05 ENCOUNTER — Emergency Department (HOSPITAL_COMMUNITY)
Admission: EM | Admit: 2020-09-05 | Discharge: 2020-09-06 | Disposition: A | Discharge status: Home / Self Care | Payer: Medicaid Other | Attending: Emergency Medicine | Admitting: Emergency Medicine

## 2020-09-05 ENCOUNTER — Emergency Department (HOSPITAL_COMMUNITY): Payer: Medicaid Other

## 2020-09-05 ENCOUNTER — Other Ambulatory Visit: Payer: Self-pay

## 2020-09-05 DIAGNOSIS — A599 Trichomoniasis, unspecified: Secondary | ICD-10-CM | POA: Diagnosis not present

## 2020-09-05 DIAGNOSIS — R531 Weakness: Secondary | ICD-10-CM | POA: Diagnosis present

## 2020-09-05 DIAGNOSIS — F17210 Nicotine dependence, cigarettes, uncomplicated: Secondary | ICD-10-CM | POA: Diagnosis not present

## 2020-09-05 DIAGNOSIS — Y900 Blood alcohol level of less than 20 mg/100 ml: Secondary | ICD-10-CM | POA: Diagnosis not present

## 2020-09-05 DIAGNOSIS — F10930 Alcohol use, unspecified with withdrawal, uncomplicated: Secondary | ICD-10-CM

## 2020-09-05 DIAGNOSIS — Z79899 Other long term (current) drug therapy: Secondary | ICD-10-CM | POA: Insufficient documentation

## 2020-09-05 DIAGNOSIS — Z91010 Allergy to peanuts: Secondary | ICD-10-CM | POA: Insufficient documentation

## 2020-09-05 DIAGNOSIS — R0602 Shortness of breath: Secondary | ICD-10-CM | POA: Diagnosis not present

## 2020-09-05 DIAGNOSIS — R079 Chest pain, unspecified: Secondary | ICD-10-CM | POA: Diagnosis not present

## 2020-09-05 DIAGNOSIS — F10239 Alcohol dependence with withdrawal, unspecified: Secondary | ICD-10-CM | POA: Diagnosis not present

## 2020-09-05 LAB — COMPREHENSIVE METABOLIC PANEL
ALT: 37 U/L (ref 0–44)
AST: 66 U/L — ABNORMAL HIGH (ref 15–41)
Albumin: 3.7 g/dL (ref 3.5–5.0)
Alkaline Phosphatase: 92 U/L (ref 38–126)
Anion gap: 10 (ref 5–15)
BUN: 7 mg/dL (ref 6–20)
CO2: 23 mmol/L (ref 22–32)
Calcium: 8.6 mg/dL — ABNORMAL LOW (ref 8.9–10.3)
Chloride: 100 mmol/L (ref 98–111)
Creatinine, Ser: 0.98 mg/dL (ref 0.44–1.00)
GFR, Estimated: >60 mL/min (ref >60)
Glucose, Bld: 84 mg/dL (ref 70–99)
Potassium: 3.8 mmol/L (ref 3.5–5.1)
Sodium: 133 mmol/L — ABNORMAL LOW (ref 135–145)
Total Bilirubin: 2.5 mg/dL — ABNORMAL HIGH (ref 0.3–1.2)
Total Protein: 6.5 g/dL (ref 6.5–8.1)

## 2020-09-05 LAB — LIPASE, BLOOD: Lipase: 30 U/L (ref 11–51)

## 2020-09-05 LAB — CBC
HCT: 42.9 % (ref 36.0–46.0)
Hemoglobin: 14.5 g/dL (ref 12.0–15.0)
MCH: 32.7 pg (ref 26.0–34.0)
MCHC: 33.8 g/dL (ref 30.0–36.0)
MCV: 96.6 fL (ref 80.0–100.0)
Platelets: 249 10*3/uL (ref 150–400)
RBC: 4.44 MIL/uL (ref 3.87–5.11)
RDW: 12.1 % (ref 11.5–15.5)
WBC: 4.4 10*3/uL (ref 4.0–10.5)
nRBC: 0 % (ref 0.0–0.2)

## 2020-09-05 LAB — I-STAT BETA HCG BLOOD, ED (MC, WL, AP ONLY): I-stat hCG, quantitative: <5 m[IU]/mL (ref <5)

## 2020-09-05 LAB — ETHANOL: Alcohol, Ethyl (B): <10 mg/dL (ref <10)

## 2020-09-05 NOTE — ED Triage Notes (Signed)
Pt arrives with multiple complaints: dx with trich and bv and has not gone to pharmacy to pick up the prescriptions so would like to be treated for them here. Has been exposed to mold so reports shob, cp, and weakness. Reports she is withdrawing from alcohol and has tremors and vomiting. Last drink yesterday and normally drinks four beers per day. Also endorses generalized abdominal pain, n/v/d x 2 weeks.

## 2020-09-05 NOTE — ED Provider Notes (Signed)
Emergency Medicine Provider Triage Evaluation Note  Darlene Gates , a 35 y.o. female  was evaluated in triage.  Pt complains of multiple complaints.  She wants treatment for BV and trich and hasn't been to pharmacy to get meds She feels like she is withdrawing off alcohol, last drink was yesterday.  She states she normally drinks about 4 to 640 ounce beers a day. She states that she was exposed to mold a few days ago and since then has been having shortness of breath, chest pain, and weakness..  Review of Systems  Positive: Nausea, vomiting, shortness of breath, weakness, chest pain Negative: fevers  Physical Exam  BP 127/83 (BP Location: Right Arm)   Pulse 79   Temp 99 F (37.2 C) (Oral)   Resp 14   LMP 08/07/2020 (Approximate)   SpO2 99%  Gen:   Awake, no distress   Resp:  Normal effort  MSK:   Moves extremities without difficulty  Other:  Awake and alert, speech is not slurred.  No obvious tremors.  She is not tachycardic.  Medical Decision Making  Medically screening exam initiated at 3:55 PM.  Appropriate orders placed.  Darlene Gates was informed that the remainder of the evaluation will be completed by another provider, this initial triage assessment does not replace that evaluation, and the importance of remaining in the ED until their evaluation is complete.     Cristina Gong, PA-C 09/05/20 1556    Zadie Rhine, MD 09/06/20 [phone]

## 2020-09-06 MED ORDER — ONDANSETRON 4 MG PO TBDP
8.0000 mg | ORAL_TABLET | Freq: Once | ORAL | Status: AC
  Administered 2020-09-06: 8 mg via ORAL
  Filled 2020-09-06: qty 2

## 2020-09-06 MED ORDER — ONDANSETRON 4 MG PO TBDP: 4.0000 mg | ORAL_TABLET | Freq: Three times a day (TID) | ORAL | 0 refills | Status: DC | PRN

## 2020-09-06 MED ORDER — ACETAMINOPHEN 325 MG PO TABS
650.0000 mg | ORAL_TABLET | Freq: Once | ORAL | Status: AC
  Administered 2020-09-06: 650 mg via ORAL
  Filled 2020-09-06: qty 2

## 2020-09-06 MED ORDER — LORAZEPAM 1 MG PO TABS: 1.0000 mg | ORAL_TABLET | Freq: Once | ORAL | Status: DC

## 2020-09-06 MED ORDER — ONDANSETRON 4 MG PO TBDP: 8.0000 mg | ORAL_TABLET | Freq: Once | ORAL | Status: DC

## 2020-09-06 MED ORDER — METRONIDAZOLE 500 MG PO TABS
2000.0000 mg | ORAL_TABLET | Freq: Once | ORAL | Status: AC
  Administered 2020-09-06: 2000 mg via ORAL
  Filled 2020-09-06: qty 4

## 2020-09-06 NOTE — ED Provider Notes (Signed)
[facility] EMERGENCY DEPARTMENT Provider Note   CSN: [id number] Arrival date & time: 09/05/20  1424     History Chief complaint - multiple complaints  Darlene Gates is a 35 y.o. female.  HPI    Patient presents with multiple complaints  1.  She reports recently being diagnosed with trichomonas and bacterial vaginosis.  Her primary care physician ordered Flagyl for her but she never took the medications.  2.  Patient also reports she may been exposed to mold and felt shortness of breath and generalized weakness  3.  Patient also reports that she is having worsening symptoms of alcohol withdrawal.  She reports last drink of alcohol was approximately 48 hours ago.  She reports her symptoms are worsening, and she is having tremors in her hands.  No seizures  4.  Patient had also mentioned to nursing staff that she had generalized abdominal pain, vomiting and diarrhea for the past 2 weeks  5.  Patient reports she is out of all of her psychiatric medications, and is requesting refills.  She denies any SI or HI.  She reports at times she does hear voices but none at this time  Past Medical History:  Diagnosis Date  . Anxiety   . Bipolar 1 disorder (HCC)   . Depression   . History of alcohol abuse   . History of substance abuse [facility])     Patient Active Problem List   Diagnosis Date Noted  . Screening examination for STD (sexually transmitted disease) 08/23/2020  . Cervical cancer screening 04/08/2020  . Attempting to conceive 03/15/2020  . High risk sexual behavior 03/15/2020  . Substance induced mood disorder (HCC) 01/15/2020  . Bipolar 1 disorder, depressed, severe (HCC) 01/15/2020  . 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 Alcohol dependence (HCC) 01/15/2020  . Opiate abuse, episodic (HCC) 01/15/2020  . Missed period 11/19/2019  . Homelessness 11/19/2019  . Bipolar I disorder (HCC) 08/05/2019  . Depression 08/05/2019  . Generalized anxiety disorder  08/05/2019  . History of substance abuse (HCC) 08/05/2019  . General counseling and advice on female contraception 06/19/2019    No past surgical history on file.   OB History    Gravida  3   Para  2   Term  1   Preterm  1   AB  1   Living  2     SAB      IAB      Ectopic      Multiple      Live Births              Family History  Problem Relation Age of Onset  . Alcohol abuse Mother   . Bipolar disorder Mother   . Depression Mother   . Alcohol abuse Father   . Hyperlipidemia Father   . Hypertension Father   . Alcohol abuse Maternal Grandmother   . Bipolar disorder Maternal Grandmother   . Alcohol abuse Maternal Grandfather     Social History   Tobacco Use  . Smoking status: Current Every Day Smoker    Packs/day: 3.00    Types: Cigarettes  . Smokeless tobacco: Never Used  Vaping Use  . Vaping Use: Never used  Substance Use Topics  . Alcohol use: Yes    Alcohol/week: 4.0 standard drinks    Types: 3 Cans of beer, 1 Shots of liquor per week    Comment: pt reports she drincks daily    Home Medications Prior to  Admission medications   Medication Sig Start Date End Date Taking? Authorizing Provider  albuterol (VENTOLIN HFA) 108 (90 Base) MCG/ACT inhaler Inhale 1-2 puffs into the lungs every 6 (six) hours as needed for wheezing or shortness of breath. 01/21/20   Aldean BakerSykes, Janet E, NP  ARIPiprazole (ABILIFY) 10 MG tablet Take 1 tablet (10 mg total) by mouth at bedtime. 03/14/20   Towanda OctavePatel, Poonam, MD  ARIPiprazole (ABILIFY) 10 MG tablet TAKE 1 TABLET (10 MG TOTAL) BY MOUTH AT BEDTIME. 02/16/20 02/15/21  Storm FriskWright, Patrick E, MD  hydrOXYzine (ATARAX/VISTARIL) 25 MG tablet Take 1 tablet (25 mg total) by mouth 3 (three) times daily as needed for anxiety. 02/16/20   Storm FriskWright, Patrick E, MD  hydrOXYzine (ATARAX/VISTARIL) 25 MG tablet TAKE 1 TABLET BY MOUTH THREE TIMES DAILY AS NEEDED FOR ANXIETY. 02/16/20 02/15/21  Storm FriskWright, Patrick E, MD  loratadine (CLARITIN) 10 MG  tablet Take 1 tablet (10 mg total) by mouth daily. 01/21/20   Aldean BakerSykes, Janet E, NP  meloxicam (MOBIC) 7.5 MG tablet Take 1 tablet (7.5 mg total) by mouth daily. 02/16/20   Storm FriskWright, Patrick E, MD  meloxicam (MOBIC) 7.5 MG tablet TAKE 1 TABLET (7.5 MG TOTAL) BY MOUTH DAILY. 02/16/20 02/15/21  Storm FriskWright, Patrick E, MD  naltrexone (DEPADE) 50 MG tablet Take 1 tablet (50 mg total) by mouth daily. 02/16/20   Storm FriskWright, Patrick E, MD  naltrexone (DEPADE) 50 MG tablet TAKE 1 TABLET (50 MG TOTAL) BY MOUTH DAILY. 02/16/20 02/15/21  Storm FriskWright, Patrick E, MD  nicotine (NICODERM CQ - DOSED IN MG/24 HOURS) 21 mg/24hr patch Place 1 patch (21 mg total) onto the skin daily. 01/21/20   Aldean BakerSykes, Janet E, NP  prazosin (MINIPRESS) 1 MG capsule Take 1 capsule (1 mg total) by mouth at bedtime. 02/16/20   Storm FriskWright, Patrick E, MD  prazosin (MINIPRESS) 1 MG capsule TAKE 1 CAPSULE (1 MG TOTAL) BY MOUTH AT BEDTIME. 02/16/20 02/15/21  Storm FriskWright, Patrick E, MD  propranolol (INDERAL) 20 MG tablet Take 1 tablet (20 mg total) by mouth 2 (two) times daily. 02/17/20   Storm FriskWright, Patrick E, MD  propranolol (INDERAL) 20 MG tablet TAKE 1 TABLET (20 MG TOTAL) BY MOUTH 2 (TWO) TIMES DAILY. 02/17/20 02/16/21  Storm FriskWright, Patrick E, MD  traZODone (DESYREL) 100 MG tablet Take 1 tablet (100 mg total) by mouth at bedtime as needed for sleep. 02/16/20   Storm FriskWright, Patrick E, MD  traZODone (DESYREL) 100 MG tablet TAKE 1 TABLET (100 MG TOTAL) BY MOUTH AT BEDTIME AS NEEDED FOR SLEEP. 02/16/20 02/15/21  Storm FriskWright, Patrick E, MD  valACYclovir (VALTREX) 500 MG tablet Take 1 tablet (500 mg total) by mouth 2 (two) times daily. 02/16/20   Storm FriskWright, Patrick E, MD  valACYclovir (VALTREX) 500 MG tablet TAKE 1 TABLET BY MOUTH TWICE DAILY. 02/16/20 02/15/21  Storm FriskWright, Patrick E, MD    Allergies    Bee pollen, Chlorpromazine, Other, Oxycodone-acetaminophen, Peanut oil, Pineapple, and Promethazine  Review of Systems   Review of Systems  Constitutional: Negative for fever.  Respiratory:  Positive for cough and shortness of breath.   Gastrointestinal: Positive for diarrhea and vomiting.  Genitourinary:       Recent diagnosis of trichomonas and bacterial vaginosis  Neurological: Positive for tremors. Negative for seizures.  Psychiatric/Behavioral: Negative for suicidal ideas.  All other systems reviewed and are negative.   Physical Exam Updated Vital Signs BP 135/87 (BP Location: Right Arm)   Pulse 73   Temp 98.4 F (36.9 C) (Oral)   Resp 19   LMP 08/07/2020 (  Approximate)   SpO2 100%   Physical Exam CONSTITUTIONAL: Disheveled, no acute distress HEAD: Normocephalic/atraumatic EYES: EOMI/PERRL ENMT: Mucous membranes moist NECK: supple no meningeal signs SPINE/BACK:entire spine nontender CV: S1/S2 noted, no murmurs/rubs/gallops noted LUNGS: Lungs are clear to auscultation bilaterally, no apparent distress ABDOMEN: soft, nontender, no rebound or guarding, bowel sounds noted throughout abdomen GU:no cva tenderness NEURO: Pt is awake/alert/appropriate, moves all extremitiesx4.  No facial droop.  No tremors noted EXTREMITIES: pulses normal/equal, full ROM SKIN: warm, color normal PSYCH: Flat affect, but otherwise appropriate and oriented.  ED Results / Procedures / Treatments   Labs (all labs ordered are listed, but only abnormal results are displayed) Labs Reviewed  COMPREHENSIVE METABOLIC PANEL - Abnormal; Notable for the following components:      Result Value   Sodium 133 (*)    Calcium 8.6 (*)    AST 66 (*)    Total Bilirubin 2.5 (*)    All other components within normal limits  LIPASE, BLOOD  CBC  ETHANOL  I-STAT BETA HCG BLOOD, ED ([facility], [facility], [facility] ONLY)    EKG EKG Interpretation  Date/Time:  Tuesday September 05 2020 14:48:59 EDT Ventricular Rate:  82 PR Interval:  144 QRS Duration: 86 QT Interval:  342 QTC Calculation: 399 R Axis:   136 Text Interpretation: ** Suspect arm lead reversal, interpretation assumes no reversal Normal sinus rhythm Minimal  voltage criteria for LVH, may be normal variant ( Sokolow-Lyon ) Lateral infarct , age undetermined Abnormal ECG No old tracing to compare Confirmed by Drema Pry [phone]) on 09/06/2020 2:00:47 AM   Radiology DG Chest 2 View  Result Date: 09/05/2020 CLINICAL DATA:  Chest pain with recent mold exposure. EXAM: CHEST - 2 VIEW COMPARISON:  None. FINDINGS: The heart size and mediastinal contours are within normal limits. Both lungs are clear. The visualized skeletal structures are unremarkable. IMPRESSION: No active cardiopulmonary disease. Electronically Signed   By: Aram Candela M.D.   On: 09/05/2020 16:19    Procedures Procedures   Medications Ordered in ED Medications  ondansetron (ZOFRAN-ODT) disintegrating tablet 8 mg (has no administration in time range)  metroNIDAZOLE (FLAGYL) tablet 2,000 mg (has no administration in time range)    ED Course  I have reviewed the triage vital signs and the nursing notes.  Pertinent labs & imaging results that were available during my care of the patient were reviewed by me and considered in my medical decision making (see chart for details).    MDM Rules/Calculators/A&P                          4:04 AM Patient presents for multiple complaints. Plan is as follows  1.  We will give one-time dose of Flagyl here for recent diagnosis of BV/trichomonas.  Patient has not had alcohol in the past 24-48 hours.  This should be safe for a one-time dose.  She also be given Zofran for nausea  2.  We will check CIWA score, however she is awake and alert in no significant signs of EtOH withdrawal   3.  Patient mentioned shortness of breath and generalized weakness as well as vomiting/diarrhea.  X-ray/EKG/labs are overall unremarkable Patient can follow-up as an outpatient with her PCP  7:05 AM Patient slept in the ED, then woke up requesting detox. Advised that she is showing no signs of significant alcohol withdrawal. Will discharge referred to  behavioral urgent care Final Clinical Impression(s) / ED Diagnoses Final diagnoses:  Trichomonas  infection  Alcohol withdrawal syndrome without complication (HCC)    Rx / DC Orders ED Discharge Orders         Ordered    ondansetron (ZOFRAN ODT) 4 MG disintegrating tablet  Every 8 hours PRN        09/06/20 0737           Zadie Rhine, MD 09/06/20 [phone]

## 2020-09-06 NOTE — Discharge Instructions (Addendum)
Substance Abuse Treatment Programs ° °Intensive Outpatient Programs °[facility]     °[address]      °[place], [place]                   °[phone]      ° °[facility] °[address] °[place], [place] °336-379-7146 ° °[facility]     °(Inpatient and outpatient)     °[address]           °[phone]   ° °[facility] °336-288-1484 (Suboxone and Methadone) ° °[address]      °[place], [place] [address]      °336-882-2125      ° °[address] °[place], [place] °852-3033 ° °[facility] (Outpatient/Inpatient, Chemical)    °(insurance only) [phone]      °       °[facility] (Groups & Residential) °[place], [place] °336-389-1413 ° °   °[facility]     °[address]     °[place], [place]      °336-389-1413      ° °[facility] (for caregivers and family) °[address] °[place], [place] °336-299-4655 ° ° ° ° ° °Residential Treatment Programs °[facility]      °[address], [place], [place] [address]  °([phone]) [phone]      ° °[facility] °[address] [place], [place] [address] °[phone] ° °[facility]        °336-248-8914      ° °[facility] °1-800-659-3381 ° °[facility]             °[address]                                         °[place], [place]                                                °877-615-2722 or 336-784-9470                              ° °[facility] [place] °[address] °[place], [address] °1.877.941.8954 ° °[facility]    °[address]      °[place], [place]     °336-273-5306      ° °[facility] °[address] °[place], [place] °336-285-9073 ° °[facility]   °[address]     °[place], [place] [address]     °336-899-1550      °Admissions: 8am-3pm M-F ° °[facility] °[address] °[place],  [place] °336-227-7417 ° °[facility]: Residential Program (90 Days)   °[place], [place]      °336-725-8389 or 800-758-6077    ° °[facility] °[place], [place] °(Walk in Hours over the weekend or by referral) ° °[facility] °[address], [place],  [address] °(336) [phone] ° °Crisis Mobile: [facility]:  1-877-626-1772 (for crisis response 24 hours a day) °[facility] Hotline:      1-800-256-2452 °Outpatient Psychiatry and Counseling ° °[facility]: Mobile Crisis   Management 24 hours:  1-877-626-1772 ° °[facility] sliding scale fee and walk in schedule: M-F 8am-12pm/1pm-3pm °[address]  °[place], [place] [address] °336-387-6161 ° °[facility] °[address] °[place], [place] [address] °336-703-9650 ° °[facility] ([facility] known [facility])- new patient walk-in appointments available Monday - Friday 8am -3pm.          °[address] °[place], [place] [address] °336-676-6840 or crisis line- 336-676-6905 ° °[facility] Intensive Outpatient Therapy Program °[address] °[place], [place] [address] °336-832-9804 ° °[facility]                  °Crisis Services      °[phone]      °[address]     °[place], [place] [address]                ° °[facility]   °[facility] °[phone] °[address] °[place], [place] [address] ° ° °[facility]          °[address],  °[place], [place] [address]       °([phone] ° °[facility] °[address] °[place], [place] [address] °336-292-1510 ° °[facility]    °[address]    °[place], [place] [address]     °336-632-3505      ° °Parish McKinney, MD     °[address]     °[place] [address]     °336-282-1251     °  °[facility] °[address] °[place] [address] ° °[facility]     °[address]     °[place], [place]      °336-542-2076      ° °[facility] °Shamsher Ahluwalia, MD °[address] °[place], [place] [address] °336-420-9558 ° °[facility]     °[address]     °[place], [place] [address]     °336-274-1237      ° °[facility] °[address] °[place], [place] [address] °336-854-4450 °Resources for Temporary Residential Assistance/Crisis Centers ° °DAY CENTERS °[facility] °M-F 8am-3pm   °[address] [place], [place] [address]   336-332-0824 °Services include: laundry, barbering, support groups, case management, phone  & computer access, showers, AA/NA mtgs, mental health/substance abuse nurse, job skills class, disability information, VA assistance, spiritual classes, etc.  ° °HOMELESS SHELTERS ° °[facility]     °[facility]   °[address], [place]     °336.271.5959       °       °[facility] (women and children)       °[address] °[place], [place] [address] °336-275-0820 °[email or web address] for application and process °Application Required ° °[facility]   °[address]    °[place] [address]     °[phone]       °             °[facility] °[address] °[place], [place] [address] °336.273.5572 °336-235-0363(schedule application appt.) °Application Required ° °[facility] (women only)    °[address]     °[place],  [address]     °336-884-1039      °  Intake starts 6pm daily °Need valid ID, SSC, & Police report °[facility] [place] °[address] °[place], [place] °336-881-5420 °Application Required ° °[facility] (men only)     °[address]      °[place], [place]     °[phone]      ° °[facility] °(Pregnant women only) °[address] °[place], [place] °336-275-0206 ° °[facility]      °[address]      °[place], [place] [address]     °336-722-9951      °       °[facility] °[address] °[place], [place] °336-723-1848 °90 day commitment/SA/Application process ° °[facility](men only)     °[address]     °[place], [place]     °336-748-1962       °Check-in at 7pm     °       °[facility] °[address] °[place], [place] [address] °336-248-6684 °Men/Women/Women and Children must be there by 7 pm ° °[facility] °[place], [place] °336-722-8721                ° °

## 2020-09-11 DIAGNOSIS — Z20822 Contact with and (suspected) exposure to covid-19: Secondary | ICD-10-CM | POA: Diagnosis not present

## 2020-09-15 ENCOUNTER — Ambulatory Visit (HOSPITAL_COMMUNITY): Payer: Medicaid Other | Admitting: Psychiatry

## 2020-09-15 ENCOUNTER — Other Ambulatory Visit: Payer: Self-pay

## 2020-09-26 DIAGNOSIS — Z1152 Encounter for screening for COVID-19: Secondary | ICD-10-CM | POA: Diagnosis not present

## 2020-09-26 DIAGNOSIS — Z20822 Contact with and (suspected) exposure to covid-19: Secondary | ICD-10-CM | POA: Diagnosis not present

## 2020-09-28 DIAGNOSIS — Z1152 Encounter for screening for COVID-19: Secondary | ICD-10-CM | POA: Diagnosis not present

## 2020-10-12 ENCOUNTER — Telehealth: Payer: Self-pay | Admitting: Family Medicine

## 2020-10-12 NOTE — Telephone Encounter (Signed)
..   Medicaid Managed Care   Unsuccessful Outreach Note  10/12/2020 Name: Darlene Gates MRN: [id number] DOB: 04-15-1985  Referred by: Towanda Octave, MD Reason for referral : High Risk Managed Medicaid (Attempted to reach patient today to get her scheduled with the Managed Medicaid LCSW for a phone visit. I left my name and number for her to call me back.)   An unsuccessful telephone outreach was attempted today. The patient was referred to the case management team for assistance with care management and care coordination.   Follow Up Plan: The care management team will reach out to the patient again over the next 7-14 days.   Weston Settle Care Guide, High Risk Medicaid Managed Care Embedded Care Coordination [facility]  [facility]

## 2020-10-13 ENCOUNTER — Telehealth: Payer: Self-pay | Admitting: Family Medicine

## 2020-10-13 NOTE — Telephone Encounter (Signed)
..   Medicaid Managed Care   Unsuccessful Outreach Note  10/13/2020 Name: Darlene Gates MRN: [id number] DOB: [birth year]  Referred by: Towanda Octave, MD Reason for referral : High Risk Managed Medicaid (Attempted to reach patient today to get her scheduled for a phone visit with the MM LCSW. I left my name and number on her VM.)   A second unsuccessful telephone outreach was attempted today. The patient was referred to the case management team for assistance with care management and care coordination.   Follow Up Plan: The care management team will reach out to the patient again over the next 7-14 days.   Weston Settle Care Guide, High Risk Medicaid Managed Care Embedded Care Coordination [facility]  [facility]

## 2020-10-17 ENCOUNTER — Telehealth: Payer: Self-pay | Admitting: Family Medicine

## 2020-10-17 NOTE — Telephone Encounter (Signed)
..   Medicaid Managed Care   Unsuccessful Outreach Note  10/17/2020 Name: Darlene Gates MRN: [id number] DOB: 18-Feb-1986  Referred by: Towanda Octave, MD Reason for referral : High Risk Managed Medicaid (Attempted tp reach this patient today to get her scheduled with the MM LCSW. I left a message on her VM with my name and number.)   Third unsuccessful telephone outreach was attempted today. The patient was referred to the case management team for assistance with care management and care coordination. The patient's primary care provider has been notified of our unsuccessful attempts to make or maintain contact with the patient. The care management team is pleased to engage with this patient at any time in the future should he/she be interested in assistance from the care management team.   Follow Up Plan: We have been unable to make contact with the patient for follow up. The care management team is available to follow up with the patient after provider conversation with the patient regarding recommendation for care management engagement and subsequent re-referral to the care management team.   Weston Settle Care Guide, High Risk Medicaid Managed Care Embedded Care Coordination [facility]  [facility]

## 2020-11-16 ENCOUNTER — Encounter (HOSPITAL_COMMUNITY): Payer: Self-pay | Admitting: Emergency Medicine

## 2020-11-16 ENCOUNTER — Other Ambulatory Visit: Payer: Self-pay

## 2020-11-16 ENCOUNTER — Emergency Department (HOSPITAL_COMMUNITY)
Admission: EM | Admit: 2020-11-16 | Discharge: 2020-11-17 | Disposition: A | Discharge status: Home / Self Care | Payer: Medicaid Other | Attending: Emergency Medicine | Admitting: Emergency Medicine

## 2020-11-16 DIAGNOSIS — R443 Hallucinations, unspecified: Secondary | ICD-10-CM | POA: Insufficient documentation

## 2020-11-16 DIAGNOSIS — F4323 Adjustment disorder with mixed anxiety and depressed mood: Secondary | ICD-10-CM | POA: Diagnosis not present

## 2020-11-16 DIAGNOSIS — F319 Bipolar disorder, unspecified: Secondary | ICD-10-CM | POA: Diagnosis not present

## 2020-11-16 DIAGNOSIS — Z5900 Homelessness unspecified: Secondary | ICD-10-CM | POA: Diagnosis not present

## 2020-11-16 DIAGNOSIS — F17210 Nicotine dependence, cigarettes, uncomplicated: Secondary | ICD-10-CM | POA: Insufficient documentation

## 2020-11-16 DIAGNOSIS — Z20822 Contact with and (suspected) exposure to covid-19: Secondary | ICD-10-CM | POA: Diagnosis not present

## 2020-11-16 DIAGNOSIS — Z91010 Allergy to peanuts: Secondary | ICD-10-CM | POA: Diagnosis not present

## 2020-11-16 DIAGNOSIS — R7309 Other abnormal glucose: Secondary | ICD-10-CM | POA: Insufficient documentation

## 2020-11-16 LAB — CBG MONITORING, ED: Glucose-Capillary: 93 mg/dL (ref 70–99)

## 2020-11-16 NOTE — ED Triage Notes (Signed)
Patient requesting help with her mental health. Patient states she just got out of detox and saw her case manager today. Patient with vague complaints and difficulty stating her complaints. Patient noted to be tangential.

## 2020-11-16 NOTE — ED Provider Notes (Signed)
Emergency Medicine Provider Triage Evaluation Note  Darlene Gates , a 35 y.o. female  was evaluated in triage.  Pt complains of getting kicked out of a halfway house for drinking. Homeless. + mental health issues and states its time form her monthly Invega shot   Review of Systems  Positive: Alcohol abuse  Negative: suicidal  Physical Exam  BP 134/82 (BP Location: Left Arm)   Pulse 84   Temp 97.9 F (36.6 C) (Oral)   Resp 15   Ht 5\' 4"  (1.626 m)   Wt 76.7 kg   SpO2 100%   BMI 29.01 kg/m  Gen:   Awake, no distress   Resp:  Normal effort  MSK:   Moves extremities without difficulty  Other:   Medical Decision Making  Medically screening exam initiated at 11:03 PM.  Appropriate orders placed.  Darlene Gates was informed that the remainder of the evaluation will be completed by another provider, this initial triage assessment does not replace that evaluation, and the importance of remaining in the ED until their evaluation is complete.  Mental health issues   [name], PA-C 11/16/20 2314    11/18/20, DO 11/16/20 2324

## 2020-11-16 NOTE — ED Provider Notes (Signed)
[facility]-EMERGENCY DEPT Provider Note   CSN: [id number] Arrival date & time: 11/16/20  2241     History Chief Complaint  Patient presents with   Mental Health Check    Darlene Gates is a 35 y.o. female.  Patient with past medical history notable for bipolar 1, depression, alcohol and substance abuse presents to the emergency department with a chief complaint of hallucinations and depression.  She states that she got kicked out of the [facility] house for having alcohol in her system.  She states that she had a small amount today to help with her anxiety.  She states that she needs help with her anxiety, depression, and her hallucinations.  She denies suicidal or homicidal thoughts.  Denies any other associated symptoms.  The history is provided by the patient. No language interpreter was used.      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 No pertinent surgical history.   OB History     Gravida  3   Para  2   Term  1   Preterm  1   AB  1   Living  2      SAB      IAB       Ectopic      Multiple      Live Births              Family History  Problem Relation Age of Onset   Alcohol abuse Mother    Bipolar disorder Mother    Depression Mother    Alcohol abuse Father    Hyperlipidemia Father    Hypertension Father    Alcohol abuse Maternal Grandmother    Bipolar disorder Maternal Grandmother    Alcohol abuse Maternal Grandfather     Social History   Tobacco Use   Smoking status: Every Day    Packs/day: 3.00    Types: Cigarettes   Smokeless tobacco: Never  Vaping Use   Vaping Use: Never used  Substance Use Topics   Alcohol use: Yes    Alcohol/week: 4.0 standard drinks    Types: 3 Cans of beer, 1 Shots of liquor per week    Comment: pt reports she drincks daily    Home Medications Prior to Admission medications   Medication Sig Start Date End Date Taking? Authorizing Provider  albuterol (VENTOLIN HFA) 108 (90 Base) MCG/ACT inhaler Inhale 1-2 puffs into the lungs every 6 (six) hours as needed for wheezing or shortness of breath. 01/21/20   Aldean Baker, NP  ARIPiprazole (ABILIFY) 10 MG tablet Take 1  tablet (10 mg total) by mouth at bedtime. 03/14/20   Towanda Octave, MD  ARIPiprazole (ABILIFY) 10 MG tablet TAKE 1 TABLET (10 MG TOTAL) BY MOUTH AT BEDTIME. 02/16/20 02/15/21  Storm Frisk, MD  hydrOXYzine (ATARAX/VISTARIL) 25 MG tablet Take 1 tablet (25 mg total) by mouth 3 (three) times daily as needed for anxiety. 02/16/20   Storm Frisk, MD  hydrOXYzine (ATARAX/VISTARIL) 25 MG tablet TAKE 1 TABLET BY MOUTH THREE TIMES DAILY AS NEEDED FOR ANXIETY. 02/16/20 02/15/21  Storm Frisk, MD  loratadine (CLARITIN) 10 MG tablet Take 1 tablet (10 mg total) by mouth daily. 01/21/20   Aldean Baker, NP  meloxicam (MOBIC) 7.5 MG tablet Take 1 tablet (7.5 mg total) by mouth daily. 02/16/20   Storm Frisk, MD  meloxicam (MOBIC) 7.5 MG tablet TAKE 1 TABLET (7.5 MG TOTAL) BY MOUTH DAILY. 02/16/20 02/15/21  Storm Frisk, MD   naltrexone (DEPADE) 50 MG tablet Take 1 tablet (50 mg total) by mouth daily. 02/16/20   Storm Frisk, MD  naltrexone (DEPADE) 50 MG tablet TAKE 1 TABLET (50 MG TOTAL) BY MOUTH DAILY. 02/16/20 02/15/21  Storm Frisk, MD  nicotine (NICODERM CQ - DOSED IN MG/24 HOURS) 21 mg/24hr patch Place 1 patch (21 mg total) onto the skin daily. 01/21/20   Aldean Baker, NP  ondansetron (ZOFRAN ODT) 4 MG disintegrating tablet Take 1 tablet (4 mg total) by mouth every 8 (eight) hours as needed. 09/06/20   Zadie Rhine, MD  prazosin (MINIPRESS) 1 MG capsule Take 1 capsule (1 mg total) by mouth at bedtime. 02/16/20   Storm Frisk, MD  prazosin (MINIPRESS) 1 MG capsule TAKE 1 CAPSULE (1 MG TOTAL) BY MOUTH AT BEDTIME. 02/16/20 02/15/21  Storm Frisk, MD  propranolol (INDERAL) 20 MG tablet Take 1 tablet (20 mg total) by mouth 2 (two) times daily. 02/17/20   Storm Frisk, MD  propranolol (INDERAL) 20 MG tablet TAKE 1 TABLET (20 MG TOTAL) BY MOUTH 2 (TWO) TIMES DAILY. 02/17/20 02/16/21  Storm Frisk, MD  traZODone (DESYREL) 100 MG tablet Take 1 tablet (100 mg total) by mouth at bedtime as needed for sleep. 02/16/20   Storm Frisk, MD  traZODone (DESYREL) 100 MG tablet TAKE 1 TABLET (100 MG TOTAL) BY MOUTH AT BEDTIME AS NEEDED FOR SLEEP. 02/16/20 02/15/21  Storm Frisk, MD  valACYclovir (VALTREX) 500 MG tablet Take 1 tablet (500 mg total) by mouth 2 (two) times daily. 02/16/20   Storm Frisk, MD  valACYclovir (VALTREX) 500 MG tablet TAKE 1 TABLET BY MOUTH TWICE DAILY. 02/16/20 02/15/21  Storm Frisk, MD    Allergies    Bee pollen, Chlorpromazine, Other, Oxycodone-acetaminophen, Peanut oil, Pineapple, and Promethazine  Review of Systems   Review of Systems  All other systems reviewed and are negative.  Physical Exam Updated Vital Signs BP 134/82 (BP Location: Left Arm)   Pulse 84   Temp 97.9 F (36.6 C) (Oral)   Resp 15   Ht 5\' 4"  (1.626 m)   Wt 76.7 kg    SpO2 100%   BMI 29.01 kg/m   Physical Exam Vitals and nursing note reviewed.  Constitutional:      General: She is not in acute distress.    Appearance: She is well-developed.  HENT:     Head: Normocephalic and atraumatic.  Eyes:     Conjunctiva/sclera: Conjunctivae normal.  Cardiovascular:     Rate and Rhythm: Normal rate.  Heart sounds: No murmur heard. Pulmonary:     Effort: Pulmonary effort is normal. No respiratory distress.  Abdominal:     General: There is no distension.     Palpations: Abdomen is soft.  Musculoskeletal:     Cervical back: Neck supple.  Skin:    General: Skin is warm and dry.  Neurological:     Mental Status: She is alert.  Psychiatric:     Comments: flat    ED Results / Procedures / Treatments   Labs (all labs ordered are listed, but only abnormal results are displayed) Labs Reviewed  RESP PANEL BY RT-PCR (FLU A&B, COVID) ARPGX2  I-STAT BETA HCG BLOOD, ED ([facility], [facility], [facility] ONLY)    EKG None  Radiology No results found.  Procedures Procedures   Medications Ordered in ED Medications - No data to display  ED Course  I have reviewed the triage vital signs and the nursing notes.  Pertinent labs & imaging results that were available during my care of the patient were reviewed by me and considered in my medical decision making (see chart for details).    MDM Rules/Calculators/A&P                           Patient here with hallucinations and depression, requesting help with her mental health.  Last drink of ETOH was today.  Will check ethanol.  No sign of ETOH withdrawal.  12:56 AM Medically clear.  Psych recommends repeat assessment in the AM. Final Clinical Impression(s) / ED Diagnoses Final diagnoses:  Hallucinations    Rx / DC Orders ED Discharge Orders     None        [name], PA-C 11/17/20 0057    Glynn Octave, MD 11/17/20 [phone]

## 2020-11-16 NOTE — ED Notes (Signed)
Attempted to bring patient to the room but patient wanted to go outside for a few minutes. Will room patient when next room is available.

## 2020-11-17 DIAGNOSIS — F4323 Adjustment disorder with mixed anxiety and depressed mood: Secondary | ICD-10-CM

## 2020-11-17 DIAGNOSIS — R443 Hallucinations, unspecified: Secondary | ICD-10-CM | POA: Diagnosis not present

## 2020-11-17 LAB — CBC WITH DIFFERENTIAL/PLATELET
Abs Immature Granulocytes: 0.02 10*3/uL (ref 0.00–0.07)
Basophils Absolute: 0 10*3/uL (ref 0.0–0.1)
Basophils Relative: 0 %
Eosinophils Absolute: 0.3 10*3/uL (ref 0.0–0.5)
Eosinophils Relative: 3 %
HCT: 37.7 % (ref 36.0–46.0)
Hemoglobin: 12.6 g/dL (ref 12.0–15.0)
Immature Granulocytes: 0 %
Lymphocytes Relative: 45 %
Lymphs Abs: 3.4 10*3/uL (ref 0.7–4.0)
MCH: 32.6 pg (ref 26.0–34.0)
MCHC: 33.4 g/dL (ref 30.0–36.0)
MCV: 97.4 fL (ref 80.0–100.0)
Monocytes Absolute: 0.8 10*3/uL (ref 0.1–1.0)
Monocytes Relative: 10 %
Neutro Abs: 3.3 10*3/uL (ref 1.7–7.7)
Neutrophils Relative %: 42 %
Platelets: 354 10*3/uL (ref 150–400)
RBC: 3.87 MIL/uL (ref 3.87–5.11)
RDW: 12.9 % (ref 11.5–15.5)
WBC: 7.7 10*3/uL (ref 4.0–10.5)
nRBC: 0 % (ref 0.0–0.2)

## 2020-11-17 LAB — COMPREHENSIVE METABOLIC PANEL
ALT: 24 U/L (ref 0–44)
AST: 19 U/L (ref 15–41)
Albumin: 4 g/dL (ref 3.5–5.0)
Alkaline Phosphatase: 70 U/L (ref 38–126)
Anion gap: 9 (ref 5–15)
BUN: 12 mg/dL (ref 6–20)
CO2: 27 mmol/L (ref 22–32)
Calcium: 9.4 mg/dL (ref 8.9–10.3)
Chloride: 103 mmol/L (ref 98–111)
Creatinine, Ser: 0.79 mg/dL (ref 0.44–1.00)
GFR, Estimated: >60 mL/min (ref >60)
Glucose, Bld: 91 mg/dL (ref 70–99)
Potassium: 4.2 mmol/L (ref 3.5–5.1)
Sodium: 139 mmol/L (ref 135–145)
Total Bilirubin: 1.1 mg/dL (ref 0.3–1.2)
Total Protein: 6.9 g/dL (ref 6.5–8.1)

## 2020-11-17 LAB — I-STAT BETA HCG BLOOD, ED (MC, WL, AP ONLY): I-stat hCG, quantitative: <5 m[IU]/mL (ref <5)

## 2020-11-17 LAB — RAPID URINE DRUG SCREEN, HOSP PERFORMED
Amphetamines: NOT DETECTED
Barbiturates: NOT DETECTED
Benzodiazepines: NOT DETECTED
Cocaine: NOT DETECTED
Opiates: NOT DETECTED
Tetrahydrocannabinol: NOT DETECTED

## 2020-11-17 LAB — RESP PANEL BY RT-PCR (FLU A&B, COVID) ARPGX2
Influenza A by PCR: NEGATIVE
Influenza B by PCR: NEGATIVE
SARS Coronavirus 2 by RT PCR: NEGATIVE

## 2020-11-17 LAB — ACETAMINOPHEN LEVEL: Acetaminophen (Tylenol), Serum: <10 ug/mL — ABNORMAL LOW (ref 10–30)

## 2020-11-17 LAB — SALICYLATE LEVEL: Salicylate Lvl: <7 mg/dL — ABNORMAL LOW (ref 7.0–30.0)

## 2020-11-17 LAB — ETHANOL: Alcohol, Ethyl (B): <10 mg/dL (ref <10)

## 2020-11-17 NOTE — Consult Note (Signed)
[facility] Face-to-Face Psychiatry Consult   Reason for Consult:  psych consult Referring Physician:  [name] PA-C Patient Identification: Darlene Gates MRN:  [id number] Principal Diagnosis: Adjustment disorder with mixed anxiety and depressed mood Diagnosis:  Principal Problem:   Adjustment disorder with mixed anxiety and depressed mood Active Problems:   Bipolar I disorder (HCC)   Homelessness   Total Time spent with patient: 20 minutes  Subjective:   Darlene Gates is a 35 y.o. female patient admitted with reported hallucinations and depression.  Patient reports increased depression and stress related to recent onset of homelessness after being dismissed from local [facility] due to positive alcohol screening.   On assessment she presents calm and cooperative; denies any suicidal or homicidal ideations, auditory or visual hallucinations, and does not appear to be actively delusional or responding to any external/internal stimuli.   HPI:   Darlene Gates is a 35 year old female with a history of bipolar 1, depression, substance induced mood disorder, alcohol and substance abuse presented to the emergency department with chief complaint of hallucinations and depression. Patient reported recently getting kicked out of the [facility] for having alcohol in her system and states she has had "a small amount" today to help with her anxiety. She is currently homeless and requesting resources for other [facility] and treatment options. She denies any withdrawal symptoms; no noted tremors or seizure activity. Vital signs WNL. UDS-; BAL<10. PDMP reviewed, no prescriptions on file.   Past Psychiatric History: bipolar 1, depression, substance induced mood disorder, alcohol and substance abuse  Risk to Self:  pt denies Risk to Others:  pt denies Prior Inpatient Therapy:  yes Prior Outpatient Therapy:  yes  Past Medical History:  Past Medical History:  Diagnosis Date    Anxiety    Bipolar 1 disorder (HCC)    Depression    History of alcohol abuse    History of substance abuse (HCC)    History reviewed. No pertinent surgical history. Family History:  Family History  Problem Relation Age of Onset   Alcohol abuse Mother    Bipolar disorder Mother    Depression Mother    Alcohol abuse Father    Hyperlipidemia Father    Hypertension Father    Alcohol abuse Maternal Grandmother    Bipolar disorder Maternal Grandmother    Alcohol abuse Maternal Grandfather    Family Psychiatric  History: not noted Social History:  Social History   Substance and Sexual Activity  Alcohol Use Yes   Alcohol/week: 4.0 standard drinks   Types: 3 Cans of beer, 1 Shots of liquor per week   Comment: pt reports she drincks daily     Social History   Substance and Sexual Activity  Drug Use Not on file   Comment: pt reports she uses daily    Social History   Socioeconomic History   Marital status: Married    Spouse name: Not on file   Number of children: Not on file   Years of education: Not on file   Highest education level: Not on file  Occupational History   Not on file  Tobacco Use   Smoking status: Every Day    Packs/day: 3.00    Types: Cigarettes   Smokeless tobacco: Never  Vaping Use   Vaping Use: Never used  Substance and Sexual Activity   Alcohol use: Yes    Alcohol/week: 4.0 standard drinks    Types: 3 Cans of beer, 1 Shots of liquor  per week    Comment: pt reports she drincks daily   Drug use: Not on file    Comment: pt reports she uses daily   Sexual activity: Yes    Partners: Male    Birth control/protection: Injection, Condom  Other Topics Concern   Not on file  Social History Narrative   Not on file   Social Determinants of Health   Financial Resource Strain: Not on file  Food Insecurity: Not on file  Transportation Needs: Not on file  Physical Activity: Not on file  Stress: Not on file  Social Connections: Not on file    Additional Social History:    Allergies:   Allergies  Allergen Reactions   Bee Pollen Hives   Chlorpromazine Anaphylaxis   Other Swelling   Oxycodone-Acetaminophen Hives   Peanut Oil Anaphylaxis and Hives   Pineapple Swelling   Promethazine Anaphylaxis and Nausea And Vomiting    Labs:  Results for orders placed or performed during the hospital encounter of 11/16/20 (from the past 48 hour(s))  Resp Panel by RT-PCR (Flu A&B, Covid) Nasopharyngeal Swab     Status: None   Collection Time: 11/16/20 11:37 PM   Specimen: Nasopharyngeal Swab; Nasopharyngeal(NP) swabs in vial transport medium  Result Value Ref Range   SARS Coronavirus 2 by RT PCR NEGATIVE NEGATIVE    Comment: (NOTE) SARS-CoV-2 target nucleic acids are NOT DETECTED.  The SARS-CoV-2 RNA is generally detectable in upper respiratory specimens during the acute phase of infection. The lowest concentration of SARS-CoV-2 viral copies this assay can detect is 138 copies/mL. A negative result does not preclude SARS-Cov-2 infection and should not be used as the sole basis for treatment or other patient management decisions. A negative result may occur with  improper specimen collection/handling, submission of specimen other than nasopharyngeal swab, presence of viral mutation(s) within the areas targeted by this assay, and inadequate number of viral copies(<138 copies/mL). A negative result must be combined with clinical observations, patient history, and epidemiological information. The expected result is Negative.  Fact Sheet for Patients:  BloggerCourse.com  Fact Sheet for Healthcare Providers:  SeriousBroker.it  This test is no t yet approved or cleared by the [place] FDA and  has been authorized for detection and/or diagnosis of SARS-CoV-2 by FDA under an Emergency Use Authorization (EUA). This EUA will remain  in effect (meaning this test can be used) for the  duration of the COVID-19 declaration under Section 564(b)(1) of the Act, 21 U.S.C.section 360bbb-3(b)(1), unless the authorization is terminated  or revoked sooner.       Influenza A by PCR NEGATIVE NEGATIVE   Influenza B by PCR NEGATIVE NEGATIVE    Comment: (NOTE) The Xpert Xpress SARS-CoV-2/FLU/RSV plus assay is intended as an aid in the diagnosis of influenza from Nasopharyngeal swab specimens and should not be used as a sole basis for treatment. Nasal washings and aspirates are unacceptable for Xpert Xpress SARS-CoV-2/FLU/RSV testing.  Fact Sheet for Patients: BloggerCourse.com  Fact Sheet for Healthcare Providers: SeriousBroker.it  This test is not yet approved or cleared by the [place] FDA and has been authorized for detection and/or diagnosis of SARS-CoV-2 by FDA under an Emergency Use Authorization (EUA). This EUA will remain in effect (meaning this test can be used) for the duration of the COVID-19 declaration under Section 564(b)(1) of the Act, 21 U.S.C. section 360bbb-3(b)(1), unless the authorization is terminated or revoked.  Performed at [facility], [address], [place], [place] [address]  CBG monitoring, ED     Status: None   Collection Time: 11/16/20 11:45 PM  Result Value Ref Range   Glucose-Capillary 93 70 - 99 mg/dL    Comment: Glucose reference range applies only to samples taken after fasting for at least 8 hours.  Comprehensive metabolic panel     Status: None   Collection Time: 11/16/20 11:50 PM  Result Value Ref Range   Sodium 139 135 - 145 mmol/L   Potassium 4.2 3.5 - 5.1 mmol/L   Chloride 103 98 - 111 mmol/L   CO2 27 22 - 32 mmol/L   Glucose, Bld 91 70 - 99 mg/dL    Comment: Glucose reference range applies only to samples taken after fasting for at least 8 hours.   BUN 12 6 - 20 mg/dL   Creatinine, Ser 3.73 0.44 - 1.00 mg/dL   Calcium 9.4 8.9 - 42.8 mg/dL   Total  Protein 6.9 6.5 - 8.1 g/dL   Albumin 4.0 3.5 - 5.0 g/dL   AST 19 15 - 41 U/L   ALT 24 0 - 44 U/L   Alkaline Phosphatase 70 38 - 126 U/L   Total Bilirubin 1.1 0.3 - 1.2 mg/dL   GFR, Estimated >76 >81 mL/min    Comment: (NOTE) Calculated using the CKD-EPI Creatinine Equation (2021)    Anion gap 9 5 - 15    Comment: Performed at [facility], [address], [place], [place] [address]  Salicylate level     Status: Abnormal   Collection Time: 11/16/20 11:50 PM  Result Value Ref Range   Salicylate Lvl <7.0 (L) 7.0 - 30.0 mg/dL    Comment: Performed at [facility], [address], [place], [place] [address]  Acetaminophen level     Status: Abnormal   Collection Time: 11/16/20 11:50 PM  Result Value Ref Range   Acetaminophen (Tylenol), Serum <10 (L) 10 - 30 ug/mL    Comment: (NOTE) Therapeutic concentrations vary significantly. A range of 10-30 ug/mL  may be an effective concentration for many patients. However, some  are best treated at concentrations outside of this range. Acetaminophen concentrations >150 ug/mL at 4 hours after ingestion  and >50 ug/mL at 12 hours after ingestion are often associated with  toxic reactions.  Performed at [facility], [address] [place], [place] [address]   Ethanol     Status: None   Collection Time: 11/16/20 11:50 PM  Result Value Ref Range   Alcohol, Ethyl (B) <10 <10 mg/dL    Comment: (NOTE) Lowest detectable limit for serum alcohol is 10 mg/dL.  For medical purposes only. Performed at [facility], [address], [place], [place] [address]   CBC WITH DIFFERENTIAL     Status: None   Collection Time: 11/16/20 11:50 PM  Result Value Ref Range   WBC 7.7 4.0 - 10.5 K/uL   RBC 3.87 3.87 - 5.11 MIL/uL   Hemoglobin 12.6 12.0 - 15.0 g/dL   HCT 64.6 80.3 - 21.2 %   MCV 97.4 80.0 - 100.0 fL   MCH 32.6 26.0 - 34.0 pg   MCHC 33.4 30.0 - 36.0 g/dL   RDW 24.8 25.0  - 03.7 %   Platelets 354 150 - 400 K/uL   nRBC 0.0 0.0 - 0.2 %   Neutrophils Relative % 42 %   Neutro Abs 3.3 1.7 - 7.7 K/uL   Lymphocytes Relative 45 %   Lymphs Abs 3.4 0.7 -  4.0 K/uL   Monocytes Relative 10 %   Monocytes Absolute 0.8 0.1 - 1.0 K/uL   Eosinophils Relative 3 %   Eosinophils Absolute 0.3 0.0 - 0.5 K/uL   Basophils Relative 0 %   Basophils Absolute 0.0 0.0 - 0.1 K/uL   Immature Granulocytes 0 %   Abs Immature Granulocytes 0.02 0.00 - 0.07 K/uL    Comment: Performed at [facility], [address] [place], [place] [address]  I-Stat beta hCG blood, ED     Status: None   Collection Time: 11/16/20 11:57 PM  Result Value Ref Range   I-stat hCG, quantitative <5.0 <5 mIU/mL   Comment 3            Comment:   GEST. AGE      CONC.  (mIU/mL)   <=1 WEEK        5 - 50     2 WEEKS       50 - 500     3 WEEKS       100 - 10,000     4 WEEKS     1,000 - 30,000        FEMALE AND NON-PREGNANT FEMALE:     LESS THAN 5 mIU/mL   Urine rapid drug screen (hosp performed)     Status: None   Collection Time: 11/17/20  8:13 AM  Result Value Ref Range   Opiates NONE DETECTED NONE DETECTED   Cocaine NONE DETECTED NONE DETECTED   Benzodiazepines NONE DETECTED NONE DETECTED   Amphetamines NONE DETECTED NONE DETECTED   Tetrahydrocannabinol NONE DETECTED NONE DETECTED   Barbiturates NONE DETECTED NONE DETECTED    Comment: (NOTE) DRUG SCREEN FOR MEDICAL PURPOSES ONLY.  IF CONFIRMATION IS NEEDED FOR ANY PURPOSE, NOTIFY LAB WITHIN 5 DAYS.  LOWEST DETECTABLE LIMITS FOR URINE DRUG SCREEN Drug Class                     Cutoff (ng/mL) Amphetamine and metabolites    1000 Barbiturate and metabolites    200 Benzodiazepine                 200 Tricyclics and metabolites     300 Opiates and metabolites        300 Cocaine and metabolites        300 THC                            50 Performed at [facility], [address], [place], [place] [address]      No current facility-administered medications for this encounter.   Current Outpatient Medications  Medication Sig Dispense Refill   ABILIFY MAINTENA 400 MG PRSY prefilled syringe Inject 400 mg into the muscle every 30 (thirty) days.     acetaminophen (TYLENOL) 500 MG tablet Take 1,000 mg by mouth every 6 (six) hours as needed.     albuterol (VENTOLIN HFA) 108 (90 Base) MCG/ACT inhaler Inhale 1-2 puffs into the lungs every 6 (six) hours as needed for wheezing or shortness of breath. 6.7 g 0   Aspirin-Acetaminophen (GOODYS BODY PAIN PO) Take 1 packet by mouth every 6 (six) hours as needed (headache).     dicyclomine (BENTYL) 20 MG tablet Take 20 mg by mouth every 6 (six) hours as needed for cramping. IBS     diphenhydrAMINE (BENADRYL) 25 mg capsule Take 25 mg by mouth every 6 (six) hours as  needed for sleep.     doxepin (SINEQUAN) 10 MG capsule Take 10 mg by mouth at bedtime.     hydrOXYzine (ATARAX/VISTARIL) 25 MG tablet Take 1 tablet (25 mg total) by mouth 3 (three) times daily as needed for anxiety. 30 tablet 0   meloxicam (MOBIC) 7.5 MG tablet Take 1 tablet (7.5 mg total) by mouth daily. 30 tablet 0   mirtazapine (REMERON) 7.5 MG tablet Take 7.5 mg by mouth at bedtime.     nicotine (NICODERM CQ - DOSED IN MG/24 HOURS) 21 mg/24hr patch Place 1 patch (21 mg total) onto the skin daily. 28 patch 0   prazosin (MINIPRESS) 1 MG capsule Take 1 capsule (1 mg total) by mouth at bedtime. 30 capsule 0   QUEtiapine (SEROQUEL) 50 MG tablet Take 50 mg by mouth at bedtime.     traZODone (DESYREL) 50 MG tablet Take 50 mg by mouth at bedtime.     VIVITROL 380 MG SUSR 380 mg by Subdermal route every 28 (twenty-eight) days.     ARIPiprazole (ABILIFY) 10 MG tablet Take 1 tablet (10 mg total) by mouth at bedtime. (Patient not taking: No sig reported) 30 tablet 0   ARIPiprazole (ABILIFY) 10 MG tablet TAKE 1 TABLET (10 MG TOTAL) BY MOUTH AT BEDTIME. (Patient not taking: No sig reported) 30 tablet 0    hydrOXYzine (ATARAX/VISTARIL) 25 MG tablet TAKE 1 TABLET BY MOUTH THREE TIMES DAILY AS NEEDED FOR ANXIETY. (Patient not taking: No sig reported) 30 tablet 0   loratadine (CLARITIN) 10 MG tablet Take 1 tablet (10 mg total) by mouth daily. (Patient not taking: No sig reported) 30 tablet 0   meloxicam (MOBIC) 7.5 MG tablet TAKE 1 TABLET (7.5 MG TOTAL) BY MOUTH DAILY. (Patient not taking: No sig reported) 30 tablet 0   naltrexone (DEPADE) 50 MG tablet Take 1 tablet (50 mg total) by mouth daily. (Patient not taking: No sig reported) 30 tablet 0   naltrexone (DEPADE) 50 MG tablet TAKE 1 TABLET (50 MG TOTAL) BY MOUTH DAILY. (Patient not taking: No sig reported) 30 tablet 0   ondansetron (ZOFRAN ODT) 4 MG disintegrating tablet Take 1 tablet (4 mg total) by mouth every 8 (eight) hours as needed. (Patient not taking: No sig reported) 5 tablet 0   prazosin (MINIPRESS) 1 MG capsule TAKE 1 CAPSULE (1 MG TOTAL) BY MOUTH AT BEDTIME. (Patient not taking: No sig reported) 30 capsule 0   propranolol (INDERAL) 20 MG tablet Take 1 tablet (20 mg total) by mouth 2 (two) times daily. (Patient not taking: No sig reported) 60 tablet 0   propranolol (INDERAL) 20 MG tablet TAKE 1 TABLET (20 MG TOTAL) BY MOUTH 2 (TWO) TIMES DAILY. (Patient not taking: No sig reported) 60 tablet 0   traZODone (DESYREL) 100 MG tablet Take 1 tablet (100 mg total) by mouth at bedtime as needed for sleep. (Patient not taking: No sig reported) 30 tablet 0   traZODone (DESYREL) 100 MG tablet TAKE 1 TABLET (100 MG TOTAL) BY MOUTH AT BEDTIME AS NEEDED FOR SLEEP. (Patient not taking: No sig reported) 30 tablet 0   valACYclovir (VALTREX) 500 MG tablet Take 1 tablet (500 mg total) by mouth 2 (two) times daily. (Patient not taking: No sig reported) 14 tablet 0   valACYclovir (VALTREX) 500 MG tablet TAKE 1 TABLET BY MOUTH TWICE DAILY. (Patient not taking: No sig reported) 14 tablet 0    Musculoskeletal: Strength & Muscle Tone: within normal limits Gait &  Station: normal Patient  leans: N/A  Psychiatric Specialty Exam:  Presentation  General Appearance: Casual  Eye Contact:Good  Speech:Clear and Coherent  Speech Volume:Normal  Handedness:Right   Mood and Affect  Mood:Dysphoric  Affect:Congruent   Thought Process  Thought Processes:Coherent; Goal Directed  Descriptions of Associations:Intact  Orientation:Full (Time, Place and Person)  Thought Content:Logical  History of Schizophrenia/Schizoaffective disorder:No  Duration of Psychotic Symptoms:N/A  Hallucinations:Hallucinations: None Ideas of Reference:None  Suicidal Thoughts:Suicidal Thoughts: No Homicidal Thoughts:Homicidal Thoughts: No  Sensorium  Memory:Immediate Fair; Remote Fair; Recent Fair  Judgment:Fair  Insight:Fair   Executive Functions  Concentration:Fair  Attention Span:Fair  Recall:Fair  Fund of Knowledge:Fair  Language:Fair   Psychomotor Activity  Psychomotor Activity: Psychomotor Activity: Normal  Assets  Assets:Communication Skills; Desire for Improvement; Physical Health   Sleep  Sleep: Sleep: Fair (patient was noted to have slept throughout the night in the hallway)  Physical Exam: Physical Exam Vitals and nursing note reviewed.  Constitutional:      Appearance: She is not ill-appearing or toxic-appearing.  HENT:     Head: Normocephalic.     Nose: Nose normal.     Mouth/Throat:     Mouth: Mucous membranes are moist.     Pharynx: Oropharynx is clear.  Eyes:     Pupils: Pupils are equal, round, and reactive to light.  Cardiovascular:     Rate and Rhythm: Tachycardia present.  Pulmonary:     Effort: Pulmonary effort is normal.  Musculoskeletal:        General: Normal range of motion.     Cervical back: Normal range of motion.  Skin:    General: Skin is warm and dry.  Neurological:     General: No focal deficit present.     Mental Status: She is alert and oriented to person, place, and time.  Psychiatric:         Attention and Perception: Attention and perception normal.        Mood and Affect: Affect is flat.        Behavior: Behavior is cooperative.        Thought Content: Thought content does not include homicidal or suicidal ideation. Thought content does not include homicidal or suicidal plan.        Cognition and Memory: Cognition and memory normal.        Judgment: Judgment normal.   Review of Systems  Psychiatric/Behavioral:  Negative for hallucinations, substance abuse and suicidal ideas.   All other systems reviewed and are negative. Blood pressure 120/66, pulse 93, temperature 98.2 F (36.8 C), temperature source Oral, resp. rate 18, height 5\' 4"  (1.626 m), weight 76.7 kg, SpO2 97 %. Body mass index is 29.01 kg/m.  Treatment Plan Summary: Plan Discharge patient with requested resources for local [facility] and other substance abuse treatment centers.   Disposition: No evidence of imminent risk to self or others at present.   Patient does not meet criteria for psychiatric inpatient admission. Supportive therapy provided about ongoing stressors. Discussed crisis plan, support from social network, calling 911, coming to the Emergency Department, and calling Suicide Hotline.  [facility], NP 11/18/2020 6:16 PM
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
## 2020-11-17 NOTE — BH Assessment (Signed)
[facility] Assessment Progress Note   Per Maxie Barb, NP, this voluntary pt does not require psychiatric hospitalization at this time.  Pt is psychiatrically cleared.  Nehemiah Settle does believe that pt would benefit from substance abuse treatment.  This Clinical research associate has provided pt with referrals for area substance use disorder treatment providers for follow up at her own initiative.  I have advised her that she may use a cordless phone to make calls while in the ED, but that she is to be discharged from [facility] no later than 15:00.  Pt mentions that she needs outpatient psychiatry services and a place to make and receive phone calls in the community.  Discharge instructions include referral information for [facility] and for the [facility].  EDP Susy Frizzle, MD and pt's nurse, Lamar Laundry, have been notified.  Doylene Canning, MA Triage Specialist [phone]

## 2020-11-17 NOTE — ED Notes (Signed)
Pt resting, awaiting the counselor's recommendation.

## 2020-11-17 NOTE — ED Notes (Signed)
Pt resting, awaiting BH counselor evaluation.

## 2020-11-17 NOTE — Discharge Instructions (Addendum)
For your behavioral health needs, you are advised to follow up with [facility] at your earliest opportunity:       [facility]      [address]      [place], [place] [address]      [phone]      They offer psychiatry/medication management, therapy and substance abuse treatment.  New patients are seen in their walk-in clinic.  Walk-in hours are Monday - Thursday from 8:00 am - 11:00 am for psychiatry, and Friday from 1:00 pm - 4:00 pm for therapy.  Walk-in patients are seen on a first come, first served basis, so try to arrive as early as possible for the best chance of being seen the same day.  For supportive services for the homeless contact the [facility]:       [facility]      [address] [place], [place] [address]      [phone]

## 2020-11-17 NOTE — Progress Notes (Signed)
CSW was consulted on pt due to pt not having a place to go upon d/c.CSW provided pt with shelter resources and bus pass.   Valentina Shaggy.Bedie Dominey, MSW, LCSWA [facility]  Transitions of Care Clinical Social Worker I Direct Dial: [phone]  Fax: [phone] [email or web address]

## 2020-11-17 NOTE — ED Provider Notes (Addendum)
Emergency Medicine Observation Re-evaluation Note  Darlene Gates is a 35 y.o. female, seen on rounds today.  Pt initially presented to the ED for complaints of Mental Health Check Currently, the patient is sleeping.  Physical Exam  BP 128/66   Pulse 66   Temp 98.2 F (36.8 C) (Oral)   Resp 16   Ht 5\' 4"  (1.626 m)   Wt 76.7 kg   SpO2 96%   BMI 29.01 kg/m  Physical Exam General: Resting Comfortably Lungs: Resp even and unlabored Psych: Sleeping, calm  ED Course / MDM  EKG:   I have reviewed the labs performed to date as well as medications administered while in observation.  Recent changes in the last 24 hours include none.  Plan  Current plan is for Psych reassessment.  Darlene Gates is not under involuntary commitment.  2:09 PM Patient seen and cleared by psychiatry, recommends outpatient substance abuse treatment.     Darlene Mark, MD 11/17/20 1409

## 2020-11-18 DIAGNOSIS — F4323 Adjustment disorder with mixed anxiety and depressed mood: Secondary | ICD-10-CM | POA: Diagnosis present

## 2020-11-20 ENCOUNTER — Telehealth: Payer: Self-pay

## 2020-11-20 NOTE — Telephone Encounter (Signed)
Transition Care Management Follow-up Telephone Call Date of discharge and from where: 11/17/2020-[place] How have you been since you were released from the hospital? Patient stated she is doing fine.  Any questions or concerns? No  Items Reviewed: Did the pt receive and understand the discharge instructions provided? Yes  Medications obtained and verified?  No medications given at discharge.  Other? No  Any new allergies since your discharge? No  Dietary orders reviewed? N/A Do you have support at home? Yes   Home Care and Equipment/Supplies: Were home health services ordered? not applicable If so, what is the name of the agency? N/A  Has the agency set up a time to come to the patient's home? not applicable Were any new equipment or medical supplies ordered?  No What is the name of the medical supply agency? N/A Were you able to get the supplies/equipment? not applicable Do you have any questions related to the use of the equipment or supplies? No  Functional Questionnaire: (I = Independent and D = Dependent) ADLs: I  Bathing/Dressing- I  Meal Prep- I  Eating- I  Maintaining continence- I  Transferring/Ambulation- I  Managing Meds- I  Follow up appointments reviewed:  PCP Hospital f/u appt confirmed? No   Specialist Hospital f/u appt confirmed? No   Are transportation arrangements needed? No  If their condition worsens, is the pt aware to call PCP or go to the Emergency Dept.? Yes Was the patient provided with contact information for the PCP's office or ED? Yes Was to pt encouraged to call back with questions or concerns? Yes

## 2020-12-25 ENCOUNTER — Encounter: Payer: Self-pay | Admitting: Family Medicine

## 2020-12-27 DIAGNOSIS — Z1152 Encounter for screening for COVID-19: Secondary | ICD-10-CM | POA: Diagnosis not present

## 2021-01-23 ENCOUNTER — Ambulatory Visit: Payer: Medicaid Other | Admitting: Family Medicine

## 2021-01-30 ENCOUNTER — Ambulatory Visit: Payer: Medicaid Other

## 2021-01-30 NOTE — Progress Notes (Deleted)
    SUBJECTIVE:   CHIEF COMPLAINT / HPI:   Vaginal Discharge: Patient is a 35 y.o. female presenting with vaginal discharge for *** days.  She states the discharge is of *** consistency.  She endorses *** vaginal odor.  She is interested in screening for sexually transmitted infections today.  HC maintenance: Counseled on flu and COVID vaccines.  PERTINENT  PMH / PSH: ***None relevant  OBJECTIVE:   There were no vitals taken for this visit.   General: NAD, pleasant, able to participate in exam Respiratory: Normal effort, no obvious respiratory distress Pelvic: VULVA: normal appearing vulva with no masses, tenderness or lesions, VAGINA: Normal appearing vagina with normal color, no lesions, with {GYN VAGINAL DISCHARGE:21986} discharge present, ***CERVIX: No lesions, {GYN VAGINAL DISCHARGE:21986} discharge present,  ASSESSMENT/PLAN:   No problem-specific Assessment & Plan notes found for this encounter.    Assessment:  35 y.o. female with vaginal discharge for***days, as well as***.  Physical exam significant for*** discharge.  Wet prep performed today shows *** consistent with ***.  Patient is interested in STI screening.   Plan: -Wet prep as above.  Will treat with***. -GC/chlamydia pending -Will check HIV and RPR  Shirlean Mylar, MD [facility]    This note was prepared using Dragon voice recognition software and may include unintentional dictation errors due to the inherent limitations of voice recognition software.

## 2021-02-09 ENCOUNTER — Other Ambulatory Visit (HOSPITAL_COMMUNITY): Payer: Self-pay

## 2021-04-11 NOTE — Progress Notes (Deleted)
° ° ° °  SUBJECTIVE:   CHIEF COMPLAINT / HPI:   Darlene Gates is a 36 y.o. female presents for pregnancy test  Pt desires pregnancy test and STD testing.   ***  Flowsheet Row Office Visit from 04/12/2020 in [facility]  PHQ-9 Total Score 12        Health Maintenance Due  Topic   COVID-19 Vaccine (5 - Booster for Pfizer series)   INFLUENZA VACCINE    Pneumococcal Vaccine 12-67 Years old (2 - PCV)      PERTINENT  PMH / PSH: substance use disorder, bipolar disorder, depression  OBJECTIVE:   There were no vitals taken for this visit.   General: Alert, no acute distress Cardio: Normal S1 and S2, RRR, no r/m/g Pulm: CTAB, normal work of breathing Abdomen: Bowel sounds normal. Abdomen soft and non-tender.  Extremities: No peripheral edema.  Neuro: Cranial nerves grossly intact   ASSESSMENT/PLAN:   No problem-specific Assessment & Plan notes found for this encounter.    Towanda Octave, MD PGY-3 [facility]

## 2021-04-12 ENCOUNTER — Ambulatory Visit: Payer: Medicaid Other | Admitting: Family Medicine

## 2021-04-12 ENCOUNTER — Other Ambulatory Visit: Payer: Self-pay

## 2021-04-24 ENCOUNTER — Other Ambulatory Visit (HOSPITAL_COMMUNITY)
Admission: RE | Admit: 2021-04-24 | Discharge: 2021-04-24 | Disposition: A | Discharge status: Home / Self Care | Payer: Medicaid Other | Source: Ambulatory Visit | Attending: Family Medicine | Admitting: Family Medicine

## 2021-04-24 ENCOUNTER — Ambulatory Visit (INDEPENDENT_AMBULATORY_CARE_PROVIDER_SITE_OTHER): Payer: Medicaid Other

## 2021-04-24 ENCOUNTER — Encounter: Payer: Self-pay | Admitting: Family Medicine

## 2021-04-24 ENCOUNTER — Other Ambulatory Visit: Payer: Self-pay | Admitting: Family Medicine

## 2021-04-24 ENCOUNTER — Other Ambulatory Visit: Payer: Self-pay

## 2021-04-24 ENCOUNTER — Ambulatory Visit (INDEPENDENT_AMBULATORY_CARE_PROVIDER_SITE_OTHER): Payer: Medicaid Other | Admitting: Family Medicine

## 2021-04-24 VITALS — BP 124/76 | HR 97 | Ht 64.0 in | Wt 161.6 lb

## 2021-04-24 DIAGNOSIS — Z3009 Encounter for other general counseling and advice on contraception: Secondary | ICD-10-CM | POA: Diagnosis not present

## 2021-04-24 DIAGNOSIS — Z23 Encounter for immunization: Secondary | ICD-10-CM

## 2021-04-24 DIAGNOSIS — B9689 Other specified bacterial agents as the cause of diseases classified elsewhere: Secondary | ICD-10-CM | POA: Insufficient documentation

## 2021-04-24 DIAGNOSIS — N760 Acute vaginitis: Secondary | ICD-10-CM | POA: Diagnosis not present

## 2021-04-24 DIAGNOSIS — Z113 Encounter for screening for infections with a predominantly sexual mode of transmission: Secondary | ICD-10-CM | POA: Diagnosis not present

## 2021-04-24 LAB — POCT WET PREP (WET MOUNT)
Clue Cells Wet Prep Whiff POC: POSITIVE
Trichomonas Wet Prep HPF POC: ABSENT

## 2021-04-24 LAB — POCT URINE PREGNANCY: Preg Test, Ur: NEGATIVE

## 2021-04-24 MED ORDER — METRONIDAZOLE 500 MG PO TABS: 500.0000 mg | ORAL_TABLET | Freq: Two times a day (BID) | ORAL | 0 refills | Status: AC

## 2021-04-24 MED ORDER — ONDANSETRON HCL 4 MG PO TABS: 4.0000 mg | ORAL_TABLET | Freq: Three times a day (TID) | ORAL | 0 refills | Status: DC | PRN

## 2021-04-24 NOTE — Assessment & Plan Note (Addendum)
Obtained std swabs today and did RPR, HIV and Hep c today. Urine pregnancy negative. Pt is still convinced she is pregnant. Recommended pt tests for a pregnancy at home in the next few weeks. F/u for birth control counseling.

## 2021-04-24 NOTE — Patient Instructions (Signed)
Thank you for coming to see me today. It was a pleasure. Today we discussed your symptoms. Pregnancy was negativec.  We performed STD testing today. This will take a few days to come back. If your MyChart is activated, we will message you on there if everything is normal, otherwise we will call. If we need to treat something we will also call you. If you do not hear from [place] in the next 4 days, please give [place] a call.   Please follow-up with me in a few weeks for birth control counseling.   If you have any questions or concerns, please do not hesitate to call the office at [phone].  Best wishes,   Dr Allena Katz

## 2021-04-24 NOTE — Progress Notes (Signed)
° ° ° °  SUBJECTIVE:   CHIEF COMPLAINT / HPI:   Darlene Gates is a 36 y.o. female presents for pregnancy test  Vaginal Discharge Patient reports that vaginal discharge started 2 days ago.  She notes that discharge appears white/cloudy.  She denies vaginal odors.  She denies vaginal pruritis, abnormal vaginal bleeding, dysuria, hematuria, frequency, abdominal pain pelvic pain, nausea, vomiting, fevers or new rash.  History of STIs-yes.  She is sexually active with one female.Contraception: none.  LMP 21st Dec. Cycle prior was 23rd November.  She does not douche.  Pt is concerned that she is pregnant. She is having all the symptoms of pregnancy, such as nausea and breast tenderness. With her son she kept testing negative for a while until it was positive. Wants to test for a pregnancy today.  [place] Office Visit from 04/24/2021 in [place]  PHQ-9 Total Score 13        PERTINENT  PMH / PSH: substance use disorder, bipolar disorder, depression  OBJECTIVE:   BP 124/76    Pulse 97    Ht 5\' 4"  (1.626 m)    Wt 161 lb 9.6 oz (73.3 kg)    LMP 03/21/2021    SpO2 97%    BMI 27.74 kg/m    General: Alert, no acute distress Cardio: well perfused Pulm: normal work of breathing  Neuro: Cranial nerves grossly intact   Pelvic Exam chaperoned by CMA Darlene Gates         External: normal female genitalia without lesions or masses        Vagina: normal without lesions or masses        Cervix: normal without lesions or masses        Samples for Wet prep, GC/Chlamydia obtained   ASSESSMENT/PLAN:   Screening examination for STD (sexually transmitted disease) Obtained std swabs today and did RPR, HIV and Hep c today. Urine pregnancy negative. Pt is still convinced she is pregnant. Recommended pt tests for a pregnancy at home in the next few weeks. F/u for birth control counseling.  BV (bacterial vaginosis) Wet prep showed BV. Sent flagyl 500mg  BID for 7 days to the  pharmacy.    Darlene Haw, MD PGY-3 [place]

## 2021-04-24 NOTE — Assessment & Plan Note (Signed)
Wet prep showed BV. Sent flagyl 500mg  BID for 7 days to the pharmacy.

## 2021-04-25 LAB — CERVICOVAGINAL ANCILLARY ONLY
Chlamydia: NEGATIVE
Comment: NEGATIVE
Comment: NEGATIVE
Comment: NORMAL
Neisseria Gonorrhea: NEGATIVE
Trichomonas: NEGATIVE

## 2021-04-25 LAB — HEPATITIS C ANTIBODY: Hep C Virus Ab: <0.1 s/co ratio (ref 0.0–0.9)

## 2021-04-25 LAB — HIV ANTIBODY (ROUTINE TESTING W REFLEX): HIV Screen 4th Generation wRfx: NONREACTIVE

## 2021-04-25 LAB — RPR: RPR Ser Ql: NONREACTIVE

## 2021-05-12 ENCOUNTER — Ambulatory Visit (HOSPITAL_COMMUNITY)
Admission: EM | Admit: 2021-05-12 | Discharge: 2021-05-12 | Discharge status: Against Medical Advice | Payer: Medicaid Other | Attending: Nurse Practitioner | Admitting: Nurse Practitioner

## 2021-05-12 ENCOUNTER — Ambulatory Visit (HOSPITAL_COMMUNITY)
Admission: AD | Admit: 2021-05-12 | Discharge: 2021-05-12 | Disposition: A | Discharge status: Home / Self Care | Payer: Medicaid Other | Attending: Psychiatry | Admitting: Psychiatry

## 2021-05-12 DIAGNOSIS — F10229 Alcohol dependence with intoxication, unspecified: Secondary | ICD-10-CM

## 2021-05-12 DIAGNOSIS — F1994 Other psychoactive substance use, unspecified with psychoactive substance-induced mood disorder: Secondary | ICD-10-CM

## 2021-05-12 DIAGNOSIS — F1911 Other psychoactive substance abuse, in remission: Secondary | ICD-10-CM

## 2021-05-12 DIAGNOSIS — F314 Bipolar disorder, current episode depressed, severe, without psychotic features: Secondary | ICD-10-CM

## 2021-05-12 DIAGNOSIS — F1490 Cocaine use, unspecified, uncomplicated: Secondary | ICD-10-CM | POA: Insufficient documentation

## 2021-05-12 DIAGNOSIS — F1190 Opioid use, unspecified, uncomplicated: Secondary | ICD-10-CM | POA: Insufficient documentation

## 2021-05-12 DIAGNOSIS — F319 Bipolar disorder, unspecified: Secondary | ICD-10-CM

## 2021-05-12 DIAGNOSIS — F411 Generalized anxiety disorder: Secondary | ICD-10-CM

## 2021-05-12 DIAGNOSIS — F1090 Alcohol use, unspecified, uncomplicated: Secondary | ICD-10-CM | POA: Insufficient documentation

## 2021-05-12 DIAGNOSIS — Z5900 Homelessness unspecified: Secondary | ICD-10-CM | POA: Insufficient documentation

## 2021-05-12 DIAGNOSIS — F200 Paranoid schizophrenia: Secondary | ICD-10-CM

## 2021-05-12 DIAGNOSIS — F10930 Alcohol use, unspecified with withdrawal, uncomplicated: Secondary | ICD-10-CM

## 2021-05-12 NOTE — ED Provider Notes (Signed)
[facility]al [facility] Urgent [facility] Medical Screening Exam  Patient Name: Darlene Gates MRN: [id number] Date of Evaluation: 05/12/21 Chief Complaint:  I want to stop using drugs Diagnosis:  Final diagnoses:  Paranoid schizophrenia ([place])    History of Present illness: Darlene Gates is a 36 y.o. female presenting voluntarily with her case manager Estill Bamberg from [employer]. Patient is currently homeless living in a temporary shelter at  [facility] on [address]. Patient states that she is hearing voices, using substances(crack cocaine and alcohol) and wants to restart her medications. Patient states that she is agreeable to going into residential substance treatment.  Patient reports that she last had treatment at [facility] in July 2022 and was on the abilify injection and vivitrol injection and that is what she wants tonight. Patient is insisting that she be given the vivitrol and abilify injection even after this provider explains to patient that we do not those on had to give to her tonight and that she would need to need to be  7-10 days since last opioid use. Patient reports that she use cocaine, crack cocaine and 40 1/2 ounces of alcohol daily. Patient presents tonight with some slurred speech, labile mood, irritability and frustration. Patient reports that she was previously receiving services from [facility] 2-3 months ago she said they refused to give her medications for unknown reasons.  Patient became irate and upset that the process was taking too long to identify a residential treatment facility for substance abuse. Patient started yelling and demanding to leave. NP and  patient's case manager Estill Bamberg attempted to encourage patient to be seen. Patient signed the AMA form and left the facility with her case manager.    Psychiatric Specialty Exam  Presentation  General Appearance:Disheveled  Eye Contact:Fair  Speech:Slurred  Speech  Volume:Decreased  Handedness:Right   Mood and Affect  Mood:Anxious; Depressed; Irritable  Affect:Labile   Thought Process  Thought Processes:Disorganized  Descriptions of Associations:Circumstantial  Orientation:Full (Time, Place and Person)  Thought Content:Perseveration; Tangential  Diagnosis of Schizophrenia or Schizoaffective disorder in past: No   Hallucinations:Auditory would not specify  Ideas of Reference:Paranoia  Suicidal Thoughts:Yes, Passive Without Intent; Without Plan; Without Means to Carry Out  Homicidal Thoughts:No   Sensorium  Memory:Immediate Fair; Recent Fair; Remote Fair  Judgment:Fair  Insight:Poor   Executive Functions  Concentration:Fair  Attention Span:Fair  Tat Momoli  Language:Good   Psychomotor Activity  Psychomotor Activity:Normal No   Assets  Assets:Communication Skills; Resilience; Social Support; Physical Health; Transportation   Sleep  Sleep:Poor  Number of hours: -1   No data recorded  Physical Exam: Physical Exam HENT:     Head: Normocephalic and atraumatic.     Nose: Nose normal.  Eyes:     Pupils: Pupils are equal, round, and reactive to light.  Cardiovascular:     Rate and Rhythm: Normal rate.  Pulmonary:     Effort: Pulmonary effort is normal.  Abdominal:     General: Abdomen is flat.  Musculoskeletal:        General: Normal range of motion.     Cervical back: Normal range of motion.  Skin:    General: Skin is warm.  Neurological:     General: No focal deficit present.     Mental Status: She is alert.  Psychiatric:        Attention and Perception: She perceives auditory and visual hallucinations.        Mood and Affect:  Mood is anxious. Affect is labile and angry.        Speech: Speech is rapid and pressured.        Behavior: Behavior is uncooperative and agitated.        Thought Content: Thought content is paranoid.        Cognition and Memory: Cognition  normal.        Judgment: Judgment is impulsive.   Review of Systems  Constitutional: Negative.   HENT: Negative.    Eyes: Negative.   Respiratory: Negative.    Cardiovascular: Negative.   Gastrointestinal: Negative.   Genitourinary: Negative.   Musculoskeletal: Negative.   Skin: Negative.   Neurological: Negative.   Endo/Heme/Allergies: Negative.   Psychiatric/Behavioral:  Positive for hallucinations and substance abuse. The patient is nervous/anxious.   Blood pressure 111/85, pulse 87, temperature 98.7 F (37.1 C), temperature source Oral, resp. rate 18, SpO2 100 %. There is no height or weight on file to calculate BMI.  Musculoskeletal: Strength & Muscle Tone: within normal limits Gait & Station: normal Patient leans: N/A   [facility] MSE Discharge Disposition for Follow up and Recommendations: Based on my evaluation the patient does not appear to have an emergency medical condition and can be discharged with resources and follow up care in outpatient services for Medication Management and [place], NP 05/12/2021, 11:28 PM

## 2021-05-13 ENCOUNTER — Emergency Department (HOSPITAL_COMMUNITY)
Admission: EM | Admit: 2021-05-13 | Discharge: 2021-05-13 | Disposition: A | Discharge status: Home / Self Care | Payer: Medicaid Other | Attending: Emergency Medicine | Admitting: Emergency Medicine

## 2021-05-13 ENCOUNTER — Encounter (HOSPITAL_COMMUNITY): Payer: Self-pay | Admitting: Psychiatry

## 2021-05-13 ENCOUNTER — Emergency Department (HOSPITAL_COMMUNITY): Payer: Medicaid Other

## 2021-05-13 ENCOUNTER — Other Ambulatory Visit: Payer: Self-pay

## 2021-05-13 ENCOUNTER — Encounter (HOSPITAL_COMMUNITY): Payer: Self-pay

## 2021-05-13 DIAGNOSIS — F200 Paranoid schizophrenia: Secondary | ICD-10-CM | POA: Diagnosis not present

## 2021-05-13 DIAGNOSIS — R45851 Suicidal ideations: Secondary | ICD-10-CM | POA: Diagnosis not present

## 2021-05-13 DIAGNOSIS — R1010 Upper abdominal pain, unspecified: Secondary | ICD-10-CM

## 2021-05-13 DIAGNOSIS — Y907 Blood alcohol level of 200-239 mg/100 ml: Secondary | ICD-10-CM | POA: Diagnosis not present

## 2021-05-13 DIAGNOSIS — R1013 Epigastric pain: Secondary | ICD-10-CM | POA: Diagnosis not present

## 2021-05-13 DIAGNOSIS — F17210 Nicotine dependence, cigarettes, uncomplicated: Secondary | ICD-10-CM | POA: Insufficient documentation

## 2021-05-13 DIAGNOSIS — N9489 Other specified conditions associated with female genital organs and menstrual cycle: Secondary | ICD-10-CM | POA: Diagnosis not present

## 2021-05-13 DIAGNOSIS — B009 Herpesviral infection, unspecified: Secondary | ICD-10-CM | POA: Insufficient documentation

## 2021-05-13 DIAGNOSIS — F39 Unspecified mood [affective] disorder: Secondary | ICD-10-CM | POA: Diagnosis not present

## 2021-05-13 DIAGNOSIS — Z20822 Contact with and (suspected) exposure to covid-19: Secondary | ICD-10-CM | POA: Diagnosis not present

## 2021-05-13 DIAGNOSIS — J45909 Unspecified asthma, uncomplicated: Secondary | ICD-10-CM | POA: Insufficient documentation

## 2021-05-13 DIAGNOSIS — F1090 Alcohol use, unspecified, uncomplicated: Secondary | ICD-10-CM | POA: Diagnosis not present

## 2021-05-13 DIAGNOSIS — Z5900 Homelessness unspecified: Secondary | ICD-10-CM | POA: Insufficient documentation

## 2021-05-13 DIAGNOSIS — Z91010 Allergy to peanuts: Secondary | ICD-10-CM | POA: Insufficient documentation

## 2021-05-13 DIAGNOSIS — F1994 Other psychoactive substance use, unspecified with psychoactive substance-induced mood disorder: Secondary | ICD-10-CM | POA: Diagnosis present

## 2021-05-13 DIAGNOSIS — F1911 Other psychoactive substance abuse, in remission: Secondary | ICD-10-CM | POA: Diagnosis present

## 2021-05-13 DIAGNOSIS — M549 Dorsalgia, unspecified: Secondary | ICD-10-CM | POA: Diagnosis not present

## 2021-05-13 LAB — URINALYSIS, ROUTINE W REFLEX MICROSCOPIC
Bacteria, UA: NONE SEEN
Bilirubin Urine: NEGATIVE
Glucose, UA: NEGATIVE mg/dL
Ketones, ur: NEGATIVE mg/dL
Leukocytes,Ua: NEGATIVE
Nitrite: NEGATIVE
Protein, ur: NEGATIVE mg/dL
Specific Gravity, Urine: 1.005 (ref 1.005–1.030)
pH: 5 (ref 5.0–8.0)

## 2021-05-13 LAB — COMPREHENSIVE METABOLIC PANEL
ALT: 17 U/L (ref 0–44)
AST: 17 U/L (ref 15–41)
Albumin: 4.2 g/dL (ref 3.5–5.0)
Alkaline Phosphatase: 64 U/L (ref 38–126)
Anion gap: 9 (ref 5–15)
BUN: 9 mg/dL (ref 6–20)
CO2: 21 mmol/L — ABNORMAL LOW (ref 22–32)
Calcium: 8.9 mg/dL (ref 8.9–10.3)
Chloride: 107 mmol/L (ref 98–111)
Creatinine, Ser: 0.62 mg/dL (ref 0.44–1.00)
GFR, Estimated: >60 mL/min (ref >60)
Glucose, Bld: 78 mg/dL (ref 70–99)
Potassium: 4 mmol/L (ref 3.5–5.1)
Sodium: 137 mmol/L (ref 135–145)
Total Bilirubin: 0.5 mg/dL (ref 0.3–1.2)
Total Protein: 6.9 g/dL (ref 6.5–8.1)

## 2021-05-13 LAB — CBC
HCT: 41.9 % (ref 36.0–46.0)
Hemoglobin: 14.1 g/dL (ref 12.0–15.0)
MCH: 32.3 pg (ref 26.0–34.0)
MCHC: 33.7 g/dL (ref 30.0–36.0)
MCV: 96.1 fL (ref 80.0–100.0)
Platelets: 318 10*3/uL (ref 150–400)
RBC: 4.36 MIL/uL (ref 3.87–5.11)
RDW: 13.2 % (ref 11.5–15.5)
WBC: 8.4 10*3/uL (ref 4.0–10.5)
nRBC: 0 % (ref 0.0–0.2)

## 2021-05-13 LAB — I-STAT BETA HCG BLOOD, ED (MC, WL, AP ONLY): I-stat hCG, quantitative: <5 m[IU]/mL (ref <5)

## 2021-05-13 LAB — RAPID URINE DRUG SCREEN, HOSP PERFORMED
Amphetamines: NOT DETECTED
Barbiturates: NOT DETECTED
Benzodiazepines: NOT DETECTED
Cocaine: NOT DETECTED
Opiates: NOT DETECTED
Tetrahydrocannabinol: POSITIVE — AB

## 2021-05-13 LAB — RESP PANEL BY RT-PCR (FLU A&B, COVID) ARPGX2
Influenza A by PCR: NEGATIVE
Influenza B by PCR: NEGATIVE
SARS Coronavirus 2 by RT PCR: NEGATIVE

## 2021-05-13 LAB — LIPASE, BLOOD: Lipase: 45 U/L (ref 11–51)

## 2021-05-13 LAB — ETHANOL: Alcohol, Ethyl (B): 223 mg/dL — ABNORMAL HIGH (ref <10)

## 2021-05-13 MED ORDER — ARIPIPRAZOLE 10 MG PO TABS: 10.0000 mg | ORAL_TABLET | Freq: Every day | ORAL | Status: DC

## 2021-05-13 MED ORDER — ONDANSETRON HCL 4 MG PO TABS
4.0000 mg | ORAL_TABLET | Freq: Three times a day (TID) | ORAL | Status: DC | PRN
  Filled 2021-05-13: qty 1

## 2021-05-13 MED ORDER — ALUM & MAG HYDROXIDE-SIMETH 200-200-20 MG/5ML PO SUSP
30.0000 mL | Freq: Once | ORAL | Status: AC
  Administered 2021-05-13: 30 mL via ORAL
  Filled 2021-05-13: qty 30

## 2021-05-13 MED ORDER — ALBUTEROL SULFATE HFA 108 (90 BASE) MCG/ACT IN AERS: 1.0000 | INHALATION_SPRAY | Freq: Four times a day (QID) | RESPIRATORY_TRACT | Status: DC | PRN

## 2021-05-13 MED ORDER — METRONIDAZOLE 500 MG PO TABS: 500.0000 mg | ORAL_TABLET | Freq: Two times a day (BID) | ORAL | 0 refills | Status: DC

## 2021-05-13 MED ORDER — ALUM & MAG HYDROXIDE-SIMETH 200-200-20 MG/5ML PO SUSP: 30.0000 mL | Freq: Four times a day (QID) | ORAL | Status: DC | PRN

## 2021-05-13 MED ORDER — NICOTINE 21 MG/24HR TD PT24: 21.0000 mg | MEDICATED_PATCH | Freq: Every day | TRANSDERMAL | Status: DC

## 2021-05-13 MED ORDER — TRAZODONE HCL 100 MG PO TABS: 50.0000 mg | ORAL_TABLET | Freq: Every day | ORAL | Status: DC

## 2021-05-13 MED ORDER — IOHEXOL 300 MG/ML  SOLN
100.0000 mL | Freq: Once | INTRAMUSCULAR | Status: AC | PRN
  Administered 2021-05-13: 100 mL via INTRAVENOUS

## 2021-05-13 MED ORDER — ACETAMINOPHEN 325 MG PO TABS
650.0000 mg | ORAL_TABLET | ORAL | Status: DC | PRN
  Filled 2021-05-13: qty 2

## 2021-05-13 MED ORDER — PRAZOSIN HCL 1 MG PO CAPS: 1.0000 mg | ORAL_CAPSULE | Freq: Every day | ORAL | Status: DC

## 2021-05-13 MED ORDER — ONDANSETRON HCL 4 MG/2ML IJ SOLN
4.0000 mg | Freq: Once | INTRAMUSCULAR | Status: AC
  Administered 2021-05-13: 4 mg via INTRAVENOUS
  Filled 2021-05-13: qty 2

## 2021-05-13 MED ORDER — PANTOPRAZOLE SODIUM 40 MG IV SOLR
40.0000 mg | Freq: Once | INTRAVENOUS | Status: AC
Start: 2021-05-13 — End: 2021-05-13
  Administered 2021-05-13: 40 mg via INTRAVENOUS
  Filled 2021-05-13: qty 10

## 2021-05-13 NOTE — BH Assessment (Signed)
Pt presents voluntary and accompanied by her caseworks [employer] and Minna Merritts F.) at the [facility]. Marchelle Folks C. was present during the assessment. Pt reports, she's mildly suicidal with a plan to overdose on pills or to use her boyfriends' gun. Per Marchelle Folks, pt said she was homicidal and had 3-40 oz beers, an airplane bottle of Vodka and Marijuana around 2100. Pt discussed that she wants to get her son back, which stops her from harming herself. Clinician and provider discussed the pt is pt be transferred to the ED for medical clearance. Clinician observed the pt become upset and frustrated. Clinician discussed the reason she's going to the hospital for medical clearance  Pt reports, she wants her medication.   Pt was previously at [facility], per [place] pt left because she was hearing voices she went outside to smoke a cigarette.   *The assessment came to an end when the pt discussed chest and back pain. Pt also discussed throwing up "yellow stuff" a couple of weeks ago. Clinician expressed to Erick Colace at [facility], per the provider Maryjean Morn, PA-C) the pt to be transported to [facility] for medical clearance.*   *TTS TO COMPLETE ASSESSMENT ONCE PT IS MEDICALLY CLEARED.*   Redmond Pulling, MS, [facility], [facility] Triage Specialist [phone]

## 2021-05-13 NOTE — ED Notes (Signed)
Pt requesting nausea and pain medicine. Pt asking when she is going home "or wherever I am going". Pt informed she is waiting for CSW to secure placement at [facility] or another facility like it for substance abuse. Pt stating "why I have to go somewhere when I already have a place to sleep?" Pt informed will look into it. Denies SI/HI or hallucinations at this time, in purple scrubs in a hallway bed in front of nursing station.
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
## 2021-05-13 NOTE — ED Provider Notes (Signed)
[facility]-EMERGENCY DEPT Provider Note   CSN: [id number] Arrival date & time: 05/13/21  0041     History  Chief Complaint  Patient presents with   Abdominal Pain    Darlene Gates is a 36 y.o. female.  The history is provided by the patient.  Abdominal Pain She has history of bipolar disorder, alcohol abuse, homelessness and comes in stating she is has been having upper abdominal pain for the last month.  Pain radiates to the back.  There has been some nausea and she has had some episodes of emesis.  Pain is not affected by eating.  She has not done anything to treat it.  She also relates that she has been suicidal for the last 2 days.  She had thoughts of taking drug overdose and shooting herself and has had access to drugs and guns.  She has been hearing voices which are telling her crazy things.  She does have history of suicide attempts in the past.  She does admit to drinking approximately 60 ounces of beer today.  She denies drug use.  She also states that she had a miscarriage 2-3 weeks ago.   Home Medications Prior to Admission medications   Medication Sig Start Date End Date Taking? Authorizing Provider  ABILIFY MAINTENA 400 MG PRSY prefilled syringe Inject 400 mg into the muscle every 30 (thirty) days. 10/16/20   [provider]  acetaminophen (TYLENOL) 500 MG tablet Take 1,000 mg by mouth every 6 (six) hours as needed.    [provider]  albuterol (VENTOLIN HFA) 108 (90 Base) MCG/ACT inhaler Inhale 1-2 puffs into the lungs every 6 (six) hours as needed for wheezing or shortness of breath. 01/21/20   Aldean Baker, NP  ARIPiprazole (ABILIFY) 10 MG tablet Take 1 tablet (10 mg total) by mouth at bedtime. Patient not taking: No sig reported 03/14/20   Towanda Octave, MD  ARIPiprazole (ABILIFY) 10 MG tablet TAKE 1 TABLET (10 MG TOTAL) BY MOUTH AT BEDTIME. Patient not taking: No sig reported 02/16/20 02/15/21  Storm Frisk, MD   Aspirin-Acetaminophen (GOODYS BODY PAIN PO) Take 1 packet by mouth every 6 (six) hours as needed (headache).    [provider]  dicyclomine (BENTYL) 20 MG tablet Take 20 mg by mouth every 6 (six) hours as needed for cramping. IBS 11/03/20   [provider]  diphenhydrAMINE (BENADRYL) 25 mg capsule Take 25 mg by mouth every 6 (six) hours as needed for sleep.    [provider]  doxepin (SINEQUAN) 10 MG capsule Take 10 mg by mouth at bedtime.    [provider]  hydrOXYzine (ATARAX/VISTARIL) 25 MG tablet Take 1 tablet (25 mg total) by mouth 3 (three) times daily as needed for anxiety. 02/16/20   Storm Frisk, MD  loratadine (CLARITIN) 10 MG tablet Take 1 tablet (10 mg total) by mouth daily. Patient not taking: No sig reported 01/21/20   Aldean Baker, NP  meloxicam (MOBIC) 7.5 MG tablet Take 1 tablet (7.5 mg total) by mouth daily. 02/16/20   Storm Frisk, MD  mirtazapine (REMERON) 7.5 MG tablet Take 7.5 mg by mouth at bedtime. 10/25/20   [provider]  naltrexone (DEPADE) 50 MG tablet Take 1 tablet (50 mg total) by mouth daily. Patient not taking: No sig reported 02/16/20   Storm Frisk, MD  nicotine (NICODERM CQ - DOSED IN MG/24 HOURS) 21 mg/24hr patch Place 1 patch (21 mg total) onto the  skin daily. 01/21/20   Aldean Baker, NP  ondansetron (ZOFRAN ODT) 4 MG disintegrating tablet Take 1 tablet (4 mg total) by mouth every 8 (eight) hours as needed. Patient not taking: No sig reported 09/06/20   Zadie Rhine, MD  ondansetron (ZOFRAN) 4 MG tablet Take 1 tablet (4 mg total) by mouth every 8 (eight) hours as needed for nausea or vomiting. 04/24/21   Towanda Octave, MD  prazosin (MINIPRESS) 1 MG capsule Take 1 capsule (1 mg total) by mouth at bedtime. 02/16/20   Storm Frisk, MD  prazosin (MINIPRESS) 1 MG capsule TAKE 1 CAPSULE (1 MG TOTAL) BY MOUTH AT BEDTIME. Patient not taking: No sig reported 02/16/20 02/15/21  Storm Frisk, MD   propranolol (INDERAL) 20 MG tablet Take 1 tablet (20 mg total) by mouth 2 (two) times daily. Patient not taking: No sig reported 02/17/20   Storm Frisk, MD  propranolol (INDERAL) 20 MG tablet TAKE 1 TABLET (20 MG TOTAL) BY MOUTH 2 (TWO) TIMES DAILY. Patient not taking: No sig reported 02/17/20 02/16/21  Storm Frisk, MD  QUEtiapine (SEROQUEL) 50 MG tablet Take 50 mg by mouth at bedtime. 10/10/20   [provider]  traZODone (DESYREL) 100 MG tablet Take 1 tablet (100 mg total) by mouth at bedtime as needed for sleep. Patient not taking: No sig reported 02/16/20   Storm Frisk, MD  traZODone (DESYREL) 100 MG tablet TAKE 1 TABLET (100 MG TOTAL) BY MOUTH AT BEDTIME AS NEEDED FOR SLEEP. Patient not taking: No sig reported 02/16/20 02/15/21  Storm Frisk, MD  traZODone (DESYREL) 50 MG tablet Take 50 mg by mouth at bedtime. 11/03/20   [provider]  valACYclovir (VALTREX) 500 MG tablet Take 1 tablet (500 mg total) by mouth 2 (two) times daily. Patient not taking: No sig reported 02/16/20   Storm Frisk, MD  VIVITROL 380 MG SUSR 380 mg by Subdermal route every 28 (twenty-eight) days. 10/06/20   [provider]      Allergies    Bee pollen, Chlorpromazine, Other, Oxycodone-acetaminophen, Peanut oil, Pineapple, and Promethazine    Review of Systems   Review of Systems  Gastrointestinal:  Positive for abdominal pain.  All other systems reviewed and are negative.  Physical Exam Updated Vital Signs BP (!) 113/96 (BP Location: Right Arm)    Pulse 91    Temp 98.2 F (36.8 C) (Oral)    Resp 18    Ht 5\' 4"  (1.626 m)    Wt 72.6 kg    SpO2 97%    BMI 27.46 kg/m  Physical Exam Vitals and nursing note reviewed.  36 year old female, resting comfortably and in no acute distress. Vital signs are significant for mildly elevated diastolic blood pressure. Oxygen saturation is 97%, which is normal. Head is normocephalic and atraumatic. PERRLA, EOMI. Oropharynx  is clear. Neck is nontender and supple without adenopathy or JVD. Back is nontender and there is no CVA tenderness. Lungs are clear without rales, wheezes, or rhonchi. Chest is nontender. Heart has regular rate and rhythm without murmur. Abdomen is soft, flat, with mild epigastric tenderness.  There is no rebound or guarding.  Murphy sign is negative.  There are no masses or hepatosplenomegaly and peristalsis is hypactive. Extremities have no cyanosis or edema, full range of motion is present. Skin is warm and dry without rash. Neurologic: Awake and alert, speech slightly slurred consistent with alcohol intoxication, cranial nerves are intact, moves all extremities equally.  ED Results / Procedures / Treatments   Labs (all labs ordered are listed, but only abnormal results are displayed) Labs Reviewed  COMPREHENSIVE METABOLIC PANEL - Abnormal; Notable for the following components:      Result Value   CO2 21 (*)    All other components within normal limits  ETHANOL - Abnormal; Notable for the following components:   Alcohol, Ethyl (B) 223 (*)    All other components within normal limits  RESP PANEL BY RT-PCR (FLU A&B, COVID) ARPGX2  LIPASE, BLOOD  CBC  URINALYSIS, ROUTINE W REFLEX MICROSCOPIC  RAPID URINE DRUG SCREEN, HOSP PERFORMED  I-STAT BETA HCG BLOOD, ED ([facility], [facility], [facility] ONLY)   None  Radiology CT ABDOMEN PELVIS W CONTRAST  Result Date: 05/13/2021 CLINICAL DATA:  Abdominal pain, nausea/vomiting, ETOH, recent miscarriage on January 24th EXAM: CT ABDOMEN AND PELVIS WITH CONTRAST TECHNIQUE: Multidetector CT imaging of the abdomen and pelvis was performed using the standard protocol following bolus administration of intravenous contrast. RADIATION DOSE REDUCTION: This exam was performed according to the departmental dose-optimization program which includes automated exposure control, adjustment of the mA and/or kV according to patient size and/or use of iterative reconstruction  technique. CONTRAST:  100mL OMNIPAQUE IOHEXOL 300 MG/ML  SOLN COMPARISON:  None. FINDINGS: Lower chest: Lung bases are clear. Hepatobiliary: Liver is within normal limits. Gallbladder is unremarkable. No intrahepatic or extrahepatic ductal dilatation. Pancreas: Within normal limits. Spleen: Within normal limits. Adrenals/Urinary Tract: Adrenal glands are within normal limits. Kidneys are within normal limits.  No hydronephrosis. Bladder is within normal limits. Stomach/Bowel: Stomach is within normal limits. No evidence of bowel obstruction. Normal appendix (series 2/image 49). No colonic wall thickening or inflammatory changes. Vascular/Lymphatic: No evidence of abdominal aortic aneurysm. No suspicious abdominopelvic lymphadenopathy. Reproductive: Uterus is mildly prominent due to recent gravid state. Bilateral ovaries are within normal limits. Other: No abdominopelvic ascites. Musculoskeletal: Visualized osseous structures are within normal limits. IMPRESSION: Negative CT abdomen/pelvis. Electronically Signed   By: Charline BillsSriyesh  Krishnan M.D.   On: 05/13/2021 03:27    Procedures Procedures    Medications Ordered in ED Medications  alum & mag hydroxide-simeth (MAALOX/MYLANTA) 200-200-20 MG/5ML suspension 30 mL (has no administration in time range)  ondansetron (ZOFRAN) injection 4 mg (has no administration in time range)  pantoprazole (PROTONIX) injection 40 mg (has no administration in time range)    ED Course/ Medical Decision Making/ A&P                           Medical Decision Making Amount and/or Complexity of Data Reviewed Labs: ordered. Radiology: ordered.  Risk OTC drugs. Prescription drug management.   Epigastric pain which is most likely alcoholic gastritis.  Differential also includes peptic ulcer disease, pancreatitis, cholecystitis, diverticulitis.  Suicidal thoughts with auditory hallucinations, will need evaluation by TTS.  Alcohol abuse.  Old records are reviewed, and she  actually had been evaluated at behavioral health urgent care where they were trying to get her to a residential treatment facility but she left AMA.  hCG level today is less than 5 indicating if she had a miscarriage, it was complete.  Ethanol level is 223, consistent with her history of recent alcohol ingestion.  CBC, metabolic panel, lipase are all normal.  She is given intravenous ondansetron for nausea and intravenous pantoprazole for suspected gastritis.  Will send for CT of abdomen and pelvis to rule out more serious pathology.  If negative, will request TTS consultation for possible  psychiatric placement.  CT of abdomen and pelvis is negative for any acute process.  I have independently viewed the images, and agree with the radiologist's interpretation.  TTS consultation is requested.  TTS consultation is appreciated.  She will be held in the emergency department for observation, they will attempt to get her placed in a residential program.        Final Clinical Impression(s) / ED Diagnoses Final diagnoses:  Upper abdominal pain  Suicidal ideation    Rx / DC Orders ED Discharge Orders     None         Dione Booze, MD 05/13/21 0725

## 2021-05-13 NOTE — TOC Initial Note (Signed)
Transition of Care [facility]) - Initial/Assessment Note    Patient Details  Name: Darlene Gates MRN: [id number] Date of Birth: Jan 06, 1986  Transition of Care [facility]) CM/SW Contact:    [name], [name] Phone Number: 05/13/2021, 5:21 PM  Clinical Narrative:                 Met with patient at bedside to provide resources for [facility] as requested. Patient declined need for [facility] Substance abuse treatment, stating that she is only here to get stabilized on her medications(blood pressure and psychiatric medications). Patient states that she resides in temporary housing and has a case worker through the [employer]. [name], that is assisting her with permament housing. [name] [phone] also given as a case manager to be contacted. Patient to return to temporary housing post discharge. Patient declined substance abuse resources at this time.  Transition of Care to continue to follow for discharge needs.   Darlene Gates [address] LCSW Transition of Care [phone]   Expected Discharge Plan: [place]     Patient Goals and CMS Choice Patient states their goals for this hospitalization and ongoing recovery are:: 'I need to get stable on my meds"      Expected Discharge Plan and Services Expected Discharge Plan: Homeless Shelter In-house Referral: Clinical Social Work     Living arrangements for the past 2 months: Homeless Shelter                                      Prior Living Arrangements/Services Living arrangements for the past 2 months: Homeless Shelter                     Activities of Daily Living      Permission Sought/Granted                  Emotional Assessment Appearance:: Appears older than stated age   Affect (typically observed): Agitated Orientation: : Oriented to Self, Oriented to Place, Oriented to Situation Alcohol / Substance Use: Alcohol Use Psych Involvement: Yes (comment)  Admission diagnosis:  abd pain  behavioral health Patient Active Problem List   Diagnosis Date Noted   Asthma without status asthmaticus 05/13/2021   HSV (herpes simplex virus) infection 05/13/2021   BV (bacterial vaginosis) 04/24/2021   Cervical cancer screening 04/08/2020   High risk sexual behavior 03/15/2020   Substance induced mood disorder ([place]) 01/15/2020   Bipolar 1 disorder, depressed, severe ([place]) 01/15/2020   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   Alcohol dependence ([place]) 01/15/2020   Opiate abuse, episodic ([place]) 01/15/2020   Homelessness 11/19/2019   Bipolar I disorder ([place]) 08/05/2019   Depression 08/05/2019   Generalized anxiety disorder 08/05/2019   History of substance abuse ([place]) 08/05/2019   BMI 32.0-32.9,adult 06/16/2018   Generalized abdominal pain 06/16/2018   Hypokalemia 06/16/2018   Tobacco abuse 06/16/2018   Low back pain 04/15/2018   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[place]) 04/09/2018   Cocaine abuse, episodic use ([place]) 04/09/2018   Moderate alcohol use disorder ([place]) 04/09/2018   History of cesarean delivery 06/06/2017   Contraception management 06/06/2017   Positive GBS test 06/02/2017   Examination of participant in clinical trial 06/02/2017   Substance use disorder 04/04/2017   Vaginitis affecting pregnancy in second trimester, antepartum 02/25/2017   Cervical insufficiency during pregnancy, antepartum 12/20/2016   High-risk pregnancy supervision 12/19/2016   Previous child with congenital anomaly,  currently pregnant, antepartum 12/19/2016   MDD (major depressive disorder), recurrent episode, 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 [place] [address] Phone: [phone] Fax: [phone]  [id number] - [place], [place] - [address] [place] [address] Phone: [phone]  Fax: [phone]     Social Determinants of Health (SDOH) Interventions    Readmission Risk Interventions No flowsheet data found.

## 2021-05-13 NOTE — H&P (Signed)
Behavioral Health Medical Screening Exam  Darlene Gates is a 36 y.o. female.  Total Time spent with patient: 45 minutes  Psychiatric Specialty Exam:  Presentation  General Appearance: Disheveled; Other (comment) (Intoxicated)  Eye Contact:Fair  Speech:Slurred; Garbled; Clear and Coherent (Mixed)  Speech Volume:Decreased  Handedness:Right   Mood and Affect  Mood:Dysphoric; Angry  Affect:Blunt; Non-Congruent   Thought Process  Thought Processes:Coherent; Disorganized  Descriptions of Associations:Circumstantial  Orientation:Partial (affected by intoxication)  Thought Content:Rumination  History of Schizophrenia/Schizoaffective disorder:No  Duration of Psychotic Symptoms:N/A  Hallucinations:Hallucinations: None Description of Auditory Hallucinations: would not specify  Ideas of Reference:Percusatory  Suicidal Thoughts:Suicidal Thoughts: Yes, Passive SI Passive Intent and/or Plan: Without Intent; With Plan (Claims to have access to Boyfriend's gun?)  Homicidal Thoughts:Homicidal Thoughts: No   Sensorium  Memory:Other (comment) (Hx of PTSD)  Judgment:Impaired  Insight:Lacking   Executive Functions  Concentration:Fair  Attention Span:Fair  [place]:-- (WDL)  Language:Other (comment) (WDL)   Psychomotor Activity  Psychomotor Activity:Psychomotor Activity: Decreased AIMS Completed?: No   Assets  Assets:Housing; Social Support; Other (comment) (Says she doesnt kill herself because of her v4 y/o son she is seeking custody. Says she has "all my rights")   Sleep  Sleep:Sleep: Poor Number of Hours of Sleep: 0 (Not consistent with substance use)    Physical Exam: Physical Exam Vitals reviewed.  Constitutional:      Appearance: She is obese. She is toxic-appearing.  HENT:     Head: Normocephalic.     Right Ear: External ear normal.     Left Ear: External ear normal.     Nose: Nose normal.     Mouth/Throat:      Pharynx: Oropharynx is clear.  Eyes:     Extraocular Movements: Extraocular movements intact.     Pupils: Pupils are equal, round, and reactive to light.     Comments: Injected conjunctiva Nystagmus present  Cardiovascular:     Rate and Rhythm: Normal rate and regular rhythm.     Pulses: Normal pulses.     Heart sounds: Normal heart sounds.  Pulmonary:     Effort: Pulmonary effort is normal.  Abdominal:     General: There is distension.     Tenderness: There is abdominal tenderness. There is right CVA tenderness and left CVA tenderness.  Genitourinary:    Comments: Not examined claims she has BV Musculoskeletal:        General: Normal range of motion.     Cervical back: Normal range of motion and neck supple.  Skin:    Comments: sweaty  Neurological:     General: No focal deficit present.     Mental Status: She is oriented to person, place, and time.     Cranial Nerves: No cranial nerve deficit.     Sensory: No sensory deficit.     Motor: No weakness.     Coordination: Coordination abnormal.     Gait: Gait abnormal.  Psychiatric:     Comments: See Consult   Review of Systems  Constitutional:  Positive for diaphoresis and malaise/fatigue. Negative for chills, fever and weight loss.  HENT: Negative.    Eyes:  Positive for redness. Negative for blurred vision, double vision, photophobia, pain and discharge.  Respiratory:  Negative for cough, hemoptysis, sputum production, shortness of breath and wheezing.   Cardiovascular:  Positive for chest pain. Negative for palpitations, orthopnea, claudication, leg swelling and PND.  Gastrointestinal:  Positive for abdominal pain, heartburn, nausea and vomiting. Negative for blood  in stool, constipation, diarrhea and melena.       Said she was waRNED ABOUT PANCREATITIS   Genitourinary:  Negative for dysuria, flank pain, frequency, hematuria and urgency.       C/o Vaginal D/C Says she has "BV"  Musculoskeletal:  Positive for back pain (?  referred) and myalgias. Negative for falls, joint pain and neck pain.  Neurological:  Positive for tremors and speech change (intoxicated). Negative for dizziness, tingling, sensory change, focal weakness, seizures, loss of consciousness, weakness and headaches.  Endo/Heme/Allergies:  Negative for environmental allergies and polydipsia. Does not bruise/bleed easily.  Psychiatric/Behavioral:  Positive for depression, memory loss, substance abuse and suicidal ideas. Negative for hallucinations. The patient has insomnia.   Blood pressure (P) 119/80, pulse (P) 85, temperature (P) 97.6 F (36.4 C), temperature source (P) Oral, resp. rate (P) 14, SpO2 (P) 99 %. There is no height or weight on file to calculate BMI.  Musculoskeletal: Strength & Muscle Tone: within normal limits Gait & Station: unsteady Patient leans: Backward   Recommendations:  Based on my evaluation the patient appears to have an emergency medical condition for which I recommend the patient be transferred to the emergency department for further evaluation.  Darlyne Russian, PA-C 05/13/2021, 2:10 AM

## 2021-05-13 NOTE — ED Triage Notes (Signed)
Pt c/o abdominal pain, nausea and vomiting for about a month. +ETOH today. Had a miscarriage on the 24th, denies any vaginal bleeding.   Arrives with caseworker. Pt says she is having "mild" SI. Said she would "take some pills or use my boyfriends gun." Not taking any medication since July.

## 2021-05-13 NOTE — Consult Note (Signed)
Telepsych Consultation   Reason for Consult:  psych consult Referring Physician:  Dione Booze, MD Location of Patient:  [facility] Location of Provider: Behavioral Health TTS Department  Patient Identification: Shaili Donalson MRN:  [id number] Principal Diagnosis: Substance induced mood disorder (HCC) Diagnosis:  Principal Problem:   Substance induced mood disorder (HCC) Active Problems:   History of substance abuse (HCC)   Total Time spent with patient: 20 minutes  Subjective:   Nathifa Ritthaler is a 36 y.o. female patient who initially presented to [facility] for auditory hallucinations, substance abuse, and wanting to restart her medications; while waiting on services patient was noted to becoming " irate and upset that the process was taking too long to identify a residential treatment facility for substance abuse. Patient started yelling and demanding to leave. NP and  patient's case manager Marchelle Folks attempted to encourage patient to be seen. Patient signed the AMA form and left the facility with her case manager." She later presented to [facility] for upper abdominal pain where she continued to endorse command auditory hallucinations and ETOH use. Patient was observed overnight in the ED with plan for placement in substance abuse treatment facility [facility]); provider notified via RN patient was stating she was no longer interested and requesting discharge. UDS+ THC, BAL 223.   On assessment patient presents alert and oriented, calm and cooperative; blunt affect. "Suicidal ideations, but I feel okay. I spoke to my son this morning, but I've been going through things trying to get him back. I'm trying to get stable on medications; I'm trying to get my Vivitrol, Abilify shot. The meds I was on at [facility] treatment center didn't do anything (07/14-08/15/22)".  States she is not interested in any inpatient or residential treatment at this time. Currently lives in [place] at the [employer];  case manager from [facility] is helping secure housing via voucher by 03/31. She denies any suicidal or homicidal ideation, auditory or visual hallucinations, and does not appear to be responding to any external/internal stimuli. She is requesting to be discharged and follow up outpatient for medication management. Provider discussed [facility] location and services to establish care to meet psychological needs, states plan to follow up in the morning.   Past Psychiatric History: bipolar I disorder, alcohol abuse, substance induced mood disorder, polysubstance abuse, MDD,   Risk to Self:  pt denies Risk to Others:  pt deneis Prior Inpatient Therapy:  yes Prior Outpatient Therapy:  yes  Past Medical History:  Past Medical History:  Diagnosis Date   Anxiety    Bipolar 1 disorder (HCC)    Depression    History of alcohol abuse    History of substance abuse (HCC)    History reviewed. No pertinent surgical history. Family History:  Family History  Problem Relation Age of Onset   Alcohol abuse Mother    Bipolar disorder Mother    Depression Mother    Alcohol abuse Father    Hyperlipidemia Father    Hypertension Father    Alcohol abuse Maternal Grandmother    Bipolar disorder Maternal Grandmother    Alcohol abuse Maternal Grandfather    Family Psychiatric  History: not noted Social History:  Social History   Substance and Sexual Activity  Alcohol Use Yes   Alcohol/week: 4.0 standard drinks   Types: 3 Cans of beer, 1 Shots of liquor per week   Comment: pt reports she drincks daily     Social History   Substance and Sexual Activity  Drug  Use Not on file   Comment: pt reports she uses daily    Social History   Socioeconomic History   Marital status: Married    Spouse name: Not on file   Number of children: Not on file   Years of education: Not on file   Highest education level: Not on file  Occupational History   Not on file  Tobacco Use   Smoking status: Every Day    Packs/day:  3.00    Types: Cigarettes   Smokeless tobacco: Never  Vaping Use   Vaping Use: Never used  Substance and Sexual Activity   Alcohol use: Yes    Alcohol/week: 4.0 standard drinks    Types: 3 Cans of beer, 1 Shots of liquor per week    Comment: pt reports she drincks daily   Drug use: Not on file    Comment: pt reports she uses daily   Sexual activity: Yes    Partners: Male    Birth control/protection: Injection, Condom  Other Topics Concern   Not on file  Social History Narrative   Not on file   Social Determinants of Health   Financial Resource Strain: Not on file  Food Insecurity: Not on file  Transportation Needs: Not on file  Physical Activity: Not on file  Stress: Not on file  Social Connections: Not on file   Additional Social History:    Allergies:   Allergies  Allergen Reactions   Bee Pollen Hives   Chlorpromazine Anaphylaxis   Other Swelling   Oxycodone-Acetaminophen Hives   Peanut Oil Anaphylaxis and Hives   Pineapple Swelling   Promethazine Anaphylaxis and Nausea And Vomiting    Labs:  Results for orders placed or performed during the hospital encounter of 05/13/21 (from the past 48 hour(s))  Lipase, blood     Status: None   Collection Time: 05/13/21 12:59 AM  Result Value Ref Range   Lipase 45 11 - 51 U/L    Comment: Performed at [facility], [address] [place], [place] [address]  Comprehensive metabolic panel     Status: Abnormal   Collection Time: 05/13/21 12:59 AM  Result Value Ref Range   Sodium 137 135 - 145 mmol/L   Potassium 4.0 3.5 - 5.1 mmol/L   Chloride 107 98 - 111 mmol/L   CO2 21 (L) 22 - 32 mmol/L   Glucose, Bld 78 70 - 99 mg/dL    Comment: Glucose reference range applies only to samples taken after fasting for at least 8 hours.   BUN 9 6 - 20 mg/dL   Creatinine, Ser [phone] 0.44 - 1.00 mg/dL   Calcium 8.9 8.9 - 19.110.3 mg/dL   Total Protein 6.9 6.5 - 8.1 g/dL   Albumin 4.2 3.5 - 5.0 g/dL   AST 17 15 - 41 U/L    ALT 17 0 - 44 U/L   Alkaline Phosphatase 64 38 - 126 U/L   Total Bilirubin 0.5 0.3 - 1.2 mg/dL   GFR, Estimated >47>60 >82>60 mL/min    Comment: (NOTE) Calculated using the CKD-EPI Creatinine Equation (2021)    Anion gap 9 5 - 15    Comment: Performed at [facility], [address] [place], [place] [address]  CBC     Status: None   Collection Time: 05/13/21 12:59 AM  Result Value Ref Range   WBC 8.4 4.0 - 10.5 K/uL   RBC 4.36 3.87 - 5.11 MIL/uL   Hemoglobin 14.1 12.0 -  15.0 g/dL   HCT 16.1 09.6 - 04.5 %   MCV 96.1 80.0 - 100.0 fL   MCH 32.3 26.0 - 34.0 pg   MCHC 33.7 30.0 - 36.0 g/dL   RDW 40.9 81.1 - 91.4 %   Platelets 318 150 - 400 K/uL   nRBC 0.0 0.0 - 0.2 %    Comment: Performed at [facility], [address] [place], [place] [address]  Ethanol     Status: Abnormal   Collection Time: 05/13/21 12:59 AM  Result Value Ref Range   Alcohol, Ethyl (B) 223 (H) <10 mg/dL    Comment: (NOTE) Lowest detectable limit for serum alcohol is 10 mg/dL.  For medical purposes only. Performed at [facility], [address], [place], [place] [address]   I-Stat beta hCG blood, ED     Status: None   Collection Time: 05/13/21  1:17 AM  Result Value Ref Range   I-stat hCG, quantitative <5.0 <5 mIU/mL   Comment 3            Comment:   GEST. AGE      CONC.  (mIU/mL)   <=1 WEEK        5 - 50     2 WEEKS       50 - 500     3 WEEKS       100 - 10,000     4 WEEKS     1,000 - 30,000        FEMALE AND NON-PREGNANT FEMALE:     LESS THAN 5 mIU/mL   Resp Panel by RT-PCR (Flu A&B, Covid) Nasopharyngeal Swab     Status: None   Collection Time: 05/13/21  1:44 AM   Specimen: Nasopharyngeal Swab; Nasopharyngeal(NP) swabs in vial transport medium  Result Value Ref Range   SARS Coronavirus 2 by RT PCR NEGATIVE NEGATIVE    Comment: (NOTE) SARS-CoV-2 target nucleic acids are NOT DETECTED.  The SARS-CoV-2 RNA is generally detectable in upper  respiratory specimens during the acute phase of infection. The lowest concentration of SARS-CoV-2 viral copies this assay can detect is 138 copies/mL. A negative result does not preclude SARS-Cov-2 infection and should not be used as the sole basis for treatment or other patient management decisions. A negative result may occur with  improper specimen collection/handling, submission of specimen other than nasopharyngeal swab, presence of viral mutation(s) within the areas targeted by this assay, and inadequate number of viral copies(<138 copies/mL). A negative result must be combined with clinical observations, patient history, and epidemiological information. The expected result is Negative.  Fact Sheet for Patients:  BloggerCourse.com  Fact Sheet for Healthcare Providers:  SeriousBroker.it  This test is no t yet approved or cleared by the [place] FDA and  has been authorized for detection and/or diagnosis of SARS-CoV-2 by FDA under an Emergency Use Authorization (EUA). This EUA will remain  in effect (meaning this test can be used) for the duration of the COVID-19 declaration under Section 564(b)(1) of the Act, 21 U.S.C.section 360bbb-3(b)(1), unless the authorization is terminated  or revoked sooner.       Influenza A by PCR NEGATIVE NEGATIVE   Influenza B by PCR NEGATIVE NEGATIVE    Comment: (NOTE) The Xpert Xpress SARS-CoV-2/FLU/RSV plus assay is intended as an aid in the diagnosis of influenza from Nasopharyngeal swab specimens and should not be used as a sole basis for treatment. Nasal washings and aspirates are unacceptable for Xpert Xpress SARS-CoV-2/FLU/RSV  testing.  Fact Sheet for Patients: BloggerCourse.com  Fact Sheet for Healthcare Providers: SeriousBroker.it  This test is not yet approved or cleared by the [place] FDA and has been authorized for  detection and/or diagnosis of SARS-CoV-2 by FDA under an Emergency Use Authorization (EUA). This EUA will remain in effect (meaning this test can be used) for the duration of the COVID-19 declaration under Section 564(b)(1) of the Act, 21 U.S.C. section 360bbb-3(b)(1), unless the authorization is terminated or revoked.  Performed at [facility], [address] [place], [place] [address]   Urinalysis, Routine w reflex microscopic Urine, Clean Catch     Status: Abnormal   Collection Time: 05/13/21  8:51 AM  Result Value Ref Range   Color, Urine YELLOW YELLOW   APPearance CLEAR CLEAR   Specific Gravity, Urine 1.005 1.005 - 1.030   pH 5.0 5.0 - 8.0   Glucose, UA NEGATIVE NEGATIVE mg/dL   Hgb urine dipstick SMALL (A) NEGATIVE   Bilirubin Urine NEGATIVE NEGATIVE   Ketones, ur NEGATIVE NEGATIVE mg/dL   Protein, ur NEGATIVE NEGATIVE mg/dL   Nitrite NEGATIVE NEGATIVE   Leukocytes,Ua NEGATIVE NEGATIVE   RBC / HPF 0-5 0 - 5 RBC/hpf   WBC, UA 0-5 0 - 5 WBC/hpf   Bacteria, UA NONE SEEN NONE SEEN   Squamous Epithelial / LPF 6-10 0 - 5    Comment: Performed at [facility], [address], [place], [place] [address]  Rapid urine drug screen (hospital performed)     Status: Abnormal   Collection Time: 05/13/21  8:51 AM  Result Value Ref Range   Opiates NONE DETECTED NONE DETECTED   Cocaine NONE DETECTED NONE DETECTED   Benzodiazepines NONE DETECTED NONE DETECTED   Amphetamines NONE DETECTED NONE DETECTED   Tetrahydrocannabinol POSITIVE (A) NONE DETECTED   Barbiturates NONE DETECTED NONE DETECTED    Comment: (NOTE) DRUG SCREEN FOR MEDICAL PURPOSES ONLY.  IF CONFIRMATION IS NEEDED FOR ANY PURPOSE, NOTIFY LAB WITHIN 5 DAYS.  LOWEST DETECTABLE LIMITS FOR URINE DRUG SCREEN Drug Class                     Cutoff (ng/mL) Amphetamine and metabolites    1000 Barbiturate and metabolites    200 Benzodiazepine                 200 Tricyclics and metabolites      300 Opiates and metabolites        300 Cocaine and metabolites        300 THC                            50 Performed at [facility], [address] [place], [place] [address]     Medications:  Current Facility-Administered Medications  Medication Dose Route Frequency Provider Last Rate Last Admin   acetaminophen (TYLENOL) tablet 650 mg  650 mg Oral Q4H PRN Dione Booze, MD       albuterol (VENTOLIN HFA) 108 (90 Base) MCG/ACT inhaler 1-2 puff  1-2 puff Inhalation Q6H PRN Dione Booze, MD       alum & mag hydroxide-simeth (MAALOX/MYLANTA) 200-200-20 MG/5ML suspension 30 mL  30 mL Oral Q6H PRN Dione Booze, MD       ARIPiprazole (ABILIFY) tablet 10 mg  10 mg Oral QHS Dione Booze, MD       nicotine (NICODERM CQ - dosed in mg/24 hours) patch  21 mg  21 mg Transdermal Daily Dione BoozeGlick, David, MD       ondansetron [facility](ZOFRAN) tablet 4 mg  4 mg Oral Q8H PRN Dione BoozeGlick, David, MD       prazosin (MINIPRESS) capsule 1 mg  1 mg Oral QHS Dione BoozeGlick, David, MD       traZODone (DESYREL) tablet 50 mg  50 mg Oral QHS Dione BoozeGlick, David, MD       Current Outpatient Medications  Medication Sig Dispense Refill   acetaminophen (TYLENOL) 500 MG tablet Take 1,000 mg by mouth every 6 (six) hours as needed.     ABILIFY MAINTENA 400 MG PRSY prefilled syringe Inject 400 mg into the muscle every 30 (thirty) days.     albuterol (VENTOLIN HFA) 108 (90 Base) MCG/ACT inhaler Inhale 1-2 puffs into the lungs every 6 (six) hours as needed for wheezing or shortness of breath. 6.7 g 0   ARIPiprazole (ABILIFY) 10 MG tablet Take 1 tablet (10 mg total) by mouth at bedtime. (Patient not taking: Reported on 11/17/2020) 30 tablet 0   Aspirin-Acetaminophen (GOODYS BODY PAIN PO) Take 1 packet by mouth every 6 (six) hours as needed (headache).     dicyclomine (BENTYL) 20 MG tablet Take 20 mg by mouth every 6 (six) hours as needed for cramping. IBS     diphenhydrAMINE (BENADRYL) 25 mg capsule Take 25 mg by mouth every 6 (six) hours  as needed for sleep.     doxepin (SINEQUAN) 10 MG capsule Take 10 mg by mouth at bedtime.     naltrexone (DEPADE) 50 MG tablet Take 1 tablet (50 mg total) by mouth daily. (Patient not taking: Reported on 11/17/2020) 30 tablet 0   ondansetron (ZOFRAN) 4 MG tablet Take 1 tablet (4 mg total) by mouth every 8 (eight) hours as needed for nausea or vomiting. 20 tablet 0   prazosin (MINIPRESS) 1 MG capsule Take 1 capsule (1 mg total) by mouth at bedtime. (Patient not taking: Reported on 05/13/2021) 30 capsule 0   propranolol (INDERAL) 20 MG tablet Take 1 tablet (20 mg total) by mouth 2 (two) times daily. (Patient not taking: No sig reported) 60 tablet 0   QUEtiapine (SEROQUEL) 50 MG tablet Take 50 mg by mouth at bedtime.     SUBOXONE 8-2 MG FILM Place 2 strips under the tongue daily. LF on 11-30-2020 # 14 DS 7     traZODone (DESYREL) 100 MG tablet TAKE 1 TABLET (100 MG TOTAL) BY MOUTH AT BEDTIME AS NEEDED FOR SLEEP. (Patient not taking: No sig reported) 30 tablet 0   traZODone (DESYREL) 50 MG tablet Take 50 mg by mouth at bedtime. (Patient not taking: Reported on 05/13/2021)     valACYclovir (VALTREX) 500 MG tablet Take 1 tablet (500 mg total) by mouth 2 (two) times daily. (Patient not taking: Reported on 11/17/2020) 14 tablet 0   VIVITROL 380 MG SUSR 380 mg by Subdermal route every 28 (twenty-eight) days.     Musculoskeletal: Strength & Muscle Tone: within normal limits Gait & Station: normal Patient leans: N/A  Psychiatric Specialty Exam:  Presentation  General Appearance: Disheveled; Other (comment) (Intoxicated)  Eye Contact:Fair  Speech:Slurred; Garbled; Clear and Coherent (Mixed)  Speech Volume:Decreased  Handedness:Right  Mood and Affect  Mood:Dysphoric; Angry  Affect:Blunt; Non-Congruent  Thought Process  Thought Processes:Coherent; Disorganized  Descriptions of Associations:Circumstantial  Orientation:Partial (affected by intoxication)  Thought Content:Rumination  History  of Schizophrenia/Schizoaffective disorder:Yes  Duration of Psychotic Symptoms:Greater than six months  Hallucinations:Hallucinations: None Description of Auditory Hallucinations:  would not specify  Ideas of Reference:Percusatory  Suicidal Thoughts:Suicidal Thoughts: Yes, Passive SI Passive Intent and/or Plan: Without Intent; With Plan (Claims to have access to Boyfriend's gun?)  Homicidal Thoughts:Homicidal Thoughts: No  Sensorium  Memory:Other (comment) (Hx of PTSD)  Judgment:Impaired  Insight:Lacking  Executive Functions  Concentration:Fair  Attention Span:Fair  Recall:Fair  Fund of Knowledge:-- (WDL)  Language:Other (comment) (WDL)  Psychomotor Activity  Psychomotor Activity:Psychomotor Activity: Decreased AIMS Completed?: No  Assets  Assets:Housing; Social Support; Other (comment) (Says she doesnt kill herself because of her v4 y/o son she is seeking custody. Says she has "all my rights")  Sleep  Sleep:Sleep: Poor Number of Hours of Sleep: 0 (Not consistent with substance use)  Physical Exam: Physical Exam Vitals and nursing note reviewed.  Constitutional:      Appearance: She is not ill-appearing.  HENT:     Head: Normocephalic.     Mouth/Throat:     Mouth: Mucous membranes are moist.     Pharynx: Oropharynx is clear.  Eyes:     Extraocular Movements: Extraocular movements intact.  Cardiovascular:     Rate and Rhythm: Normal rate.  Pulmonary:     Effort: Pulmonary effort is normal.  Musculoskeletal:        General: Normal range of motion.  Skin:    General: Skin is warm and dry.  Neurological:     Mental Status: She is alert. Mental status is at baseline.  Psychiatric:        Attention and Perception: Attention and perception normal.        Mood and Affect: Mood and affect normal.        Speech: Speech normal. Speech is not rapid and pressured or slurred.        Behavior: Behavior is cooperative.        Thought Content: Thought content is not  paranoid or delusional. Thought content does not include homicidal or suicidal ideation. Thought content does not include homicidal or suicidal plan.        Cognition and Memory: Cognition and memory normal.        Judgment: Judgment normal.   Review of Systems  Psychiatric/Behavioral:  Positive for depression and substance abuse. Negative for hallucinations and suicidal ideas.   All other systems reviewed and are negative. Blood pressure 111/66, pulse 97, temperature 98.1 F (36.7 C), temperature source Oral, resp. rate 16, height 5\' 4"  (1.626 m), weight 72.6 kg, SpO2 100 %. Body mass index is 27.46 kg/m.  Treatment Plan Summary: Plan Discharge patient with requested outpatient resources for [facility] added to AVS.    Disposition: No evidence of imminent risk to self or others at present.   Patient does not meet criteria for psychiatric inpatient admission. Supportive therapy provided about ongoing stressors. Discussed crisis plan, support from social network, calling 911, coming to the Emergency Department, and calling Suicide Hotline.  This service was provided via telemedicine using a 2-way, interactive audio and video technology.  Names of all persons participating in this telemedicine service and their role in this encounter. Name: [facility] Role: PMHNP  Name: [place] Role: Attending MD  Name: Maxie Barb Role: patient  Name:  Role:     Kathlyn Sacramento, NP 05/13/2021 6:40 PM

## 2021-05-13 NOTE — ED Notes (Signed)
Pt NAD, a/ox4. Pt verbalizes understanding of all DC and f/u instructions. All questions answered. Pt walks with steady gait to lobby at DC.  ? ?

## 2021-05-13 NOTE — ED Triage Notes (Signed)
Pt arrives via [facility] from [facility]. Per medic report the pt has had violent tendencies toward a neighbor and HI seen at [facility] for the same, signed out AMA. Went to [facility] today with her case worker, she was not yet seen at [facility], they sent here for further eval. She is having Abdominal, nausea, vomiting for 3 days, hx of pancreatitis. Drinking binge for 3 days. Hx of schizophrenia.  108/62, hr 74,, cbg 91, 99% ra.

## 2021-05-13 NOTE — ED Notes (Signed)
Per NP Leevy-Johnson, pt will be cleared for psych and DC. MD China aware, awaiting NP notes

## 2021-05-13 NOTE — ED Notes (Signed)
Pt case worker is leaving, if pt is d/c she will need to back to the [place] at the [facility]- she is not able to return to the [facility] residence d/t conflict with other residences.

## 2021-05-13 NOTE — Progress Notes (Signed)
°   05/12/21 2354  [place] Triage Screening (Walk-ins at [facility] only)  How Did You Hear About [place]? Other (Comment) Darlene Gates with [facility])  What Is the Reason for Your Visit/Call Today? Darlene Gates is a 36 year old female presenting voluntary to [facility] requesting medications, Vivatrol and Abilify injection. Patient is accompanied by Darlene Gates, Tourist information centre manager, from [employer]. Patient resides at [facility]. Patient reported that [facility] was not giving her medication. Patient reported drinking alcohol daily. Patient became agitated that she could not get her injection shots and requested to leave. Patient signed AMA form.  Have You Recently Had Any Thoughts About Hurting Yourself?  Darlene Gates)  Are You Planning to Commit Suicide/Harm Yourself At This time?  Darlene Gates)  Have you Recently Had Thoughts About [place]?  Darlene Gates)  Are You Planning To Harm Someone At This Time?  Darlene Gates)  Are you currently experiencing any auditory, visual or other hallucinations? Yes  Please explain the hallucinations you are currently experiencing: uta  Have You Used Any Alcohol or Drugs in the Past 24 Hours? Yes  How long ago did you use Drugs or Alcohol? uta  What Did You Use and How Much? uta  Do you have any current medical co-morbidities that require immediate attention?  Darlene Gates)  Clinician description of patient physical appearance/behavior: disheveled  What Do You Feel Would Help You the Most Today? Medication(s)  If access to [facility] was not available, would you have sought care in the Emergency Department?  Darlene Gates)  Determination of Need  Darlene Gates)  Options For Referral Medication Management;Chemical Dependency Intensive Outpatient Therapy (CDIOP)

## 2021-05-21 ENCOUNTER — Ambulatory Visit (INDEPENDENT_AMBULATORY_CARE_PROVIDER_SITE_OTHER): Payer: Medicaid Other | Admitting: Psychiatry

## 2021-05-21 ENCOUNTER — Other Ambulatory Visit: Payer: Self-pay

## 2021-05-21 ENCOUNTER — Encounter (HOSPITAL_COMMUNITY): Payer: Self-pay | Admitting: Psychiatry

## 2021-05-21 VITALS — BP 127/90 | HR 69 | Ht 60.0 in | Wt 158.0 lb

## 2021-05-21 DIAGNOSIS — F4310 Post-traumatic stress disorder, unspecified: Secondary | ICD-10-CM

## 2021-05-21 DIAGNOSIS — F319 Bipolar disorder, unspecified: Secondary | ICD-10-CM | POA: Diagnosis not present

## 2021-05-21 DIAGNOSIS — F1910 Other psychoactive substance abuse, uncomplicated: Secondary | ICD-10-CM

## 2021-05-21 DIAGNOSIS — F330 Major depressive disorder, recurrent, mild: Secondary | ICD-10-CM | POA: Diagnosis not present

## 2021-05-21 MED ORDER — ARIPIPRAZOLE 10 MG PO TABS: 10.0000 mg | ORAL_TABLET | Freq: Every day | ORAL | 0 refills | Status: DC

## 2021-05-21 MED ORDER — ARIPIPRAZOLE ER 400 MG IM PRSY
400.0000 mg | PREFILLED_SYRINGE | INTRAMUSCULAR | Status: DC
  Administered 2021-05-29: 400 mg via INTRAMUSCULAR
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
## 2021-05-29 ENCOUNTER — Encounter (HOSPITAL_COMMUNITY): Payer: Self-pay

## 2021-05-29 ENCOUNTER — Other Ambulatory Visit: Payer: Self-pay

## 2021-05-29 ENCOUNTER — Ambulatory Visit (INDEPENDENT_AMBULATORY_CARE_PROVIDER_SITE_OTHER): Payer: Medicaid Other | Admitting: *Deleted

## 2021-05-29 VITALS — BP 130/98 | HR 75 | Ht 60.0 in | Wt 160.0 lb

## 2021-05-29 DIAGNOSIS — F319 Bipolar disorder, unspecified: Secondary | ICD-10-CM

## 2021-05-29 NOTE — Progress Notes (Signed)
PATIENT ARRIVED FOR HER  1ST INJECTION IN ALONG WHILE AFTER COMPLETING THE  7 DAY TABLET REGIMEN. PATIENT WAS PLEASANT & HAD LOTS OF QUESTIONS CONCERNING NEWLY ACQUIRED CARE PLANS FROM HER CASE MGMT TEAM.  PATIENT TOLERATED INJECTION WELL IN RIGHT-ARM  GIVEN ARIPiprazole ER (ABILIFY MAINTENA) 400 MG

## 2021-06-20 ENCOUNTER — Telehealth (HOSPITAL_COMMUNITY): Payer: Self-pay | Admitting: *Deleted

## 2021-06-20 NOTE — Telephone Encounter (Signed)
Called to ask if I can give her a sample for her shot this week and when she gets paid she will replace it. Told her I would reschedule her for when she can afford to get her 3.00 rx for her monthly shot. She is now coming on 07/03/21 at 1030 and we will cancel her current shot appt on the 28th. ?

## 2021-06-21 ENCOUNTER — Ambulatory Visit (INDEPENDENT_AMBULATORY_CARE_PROVIDER_SITE_OTHER): Payer: Medicaid Other | Admitting: Psychiatry

## 2021-06-21 ENCOUNTER — Encounter (HOSPITAL_COMMUNITY): Payer: Self-pay | Admitting: Psychiatry

## 2021-06-21 DIAGNOSIS — F314 Bipolar disorder, current episode depressed, severe, without psychotic features: Secondary | ICD-10-CM | POA: Diagnosis not present

## 2021-06-21 MED ORDER — ARIPIPRAZOLE ER 400 MG IM PRSY: 400.0000 mg | PREFILLED_SYRINGE | INTRAMUSCULAR | Status: DC

## 2021-06-21 MED ORDER — BENZTROPINE MESYLATE 0.5 MG PO TABS: 0.5000 mg | ORAL_TABLET | Freq: Two times a day (BID) | ORAL | 2 refills | Status: DC

## 2021-06-21 NOTE — Progress Notes (Signed)
BH MD/PA/NP OP Progress Note ? ?06/21/2021 9:32 AM ?Darlene Gates  ?MRN:  [id number] ? ?Virtual Visit via Telephone Note ? ?I connected with Darlene Gates on 06/21/21 at  9:00 AM EDT by telephone and verified that I am speaking with the correct person using two identifiers. ? ?Location: ?Patient: home ?Provider: off site ?  ?I discussed the limitations, risks, security and privacy concerns of performing an evaluation and management service by telephone and the availability of in person appointments. I also discussed with the patient that there may be a patient responsible charge related to this service. The patient expressed understanding and agreed to proceed. ? ?  ?I discussed the assessment and treatment plan with the patient. The patient was provided an opportunity to ask questions and all were answered. The patient agreed with the plan and demonstrated an understanding of the instructions. ?  ?The patient was advised to call back or seek an in-person evaluation if the symptoms worsen or if the condition fails to improve as anticipated. ? ?I provided 20 minutes of non-face-to-face time during this encounter. ? ? ?Mcneil Sober, NP  ? ?Chief Complaint: Medication management ? ?HPI: Darlene Gates is a 36 year old female presenting to [facility] for a follow up psychiatric evaluation.  She has a psychiatric history of bipolar disorder, PTSD, major depressive disorder and polysubstance use disorder.  Her symptoms are managed with Abilify maintainer 400 mg every 28 days IM.  Patient reports that she last received her injection on May 29, 2021.  Patient reports that she never picked up her oral Abilify from the pharmacy upon initial prescribing.  She reports teeth chattering, starting approximately 1 week ago.  Patient is open to starting Cogentin 0.5 mg twice daily for EPS. ? ?Patient is alert and oriented x4, calm, pleasant and willing to engage.  She reports a good  mood, sleep and appetite.  Patient reports that she is working with [employer] to obtain independent housing.  She reports needing an FL 2 completed at some time during the process.  Patient denies suicidal homicidal ideations, paranoia, delusional thought, auditory or visual hallucinations. ? ?Visit Diagnosis:  ?  ICD-10-CM   ?1. Bipolar 1 disorder, depressed, severe (HCC)  F31.4   ?  ? ? ?Past Psychiatric History: See below ? ?Past Medical History:  ?Past Medical History:  ?Diagnosis Date  ? Anxiety   ? Bipolar 1 disorder (HCC)   ? Depression   ? History of alcohol abuse   ? History of substance abuse (HCC)   ? No past surgical history on file. ? ?Family Psychiatric History: See below ? ?Family History:  ?Family History  ?Problem Relation Age of Onset  ? Alcohol abuse Mother   ? Bipolar disorder Mother   ? Depression Mother   ? Alcohol abuse Father   ? Hyperlipidemia Father   ? Hypertension Father   ? Alcohol abuse Maternal Grandmother   ? Bipolar disorder Maternal Grandmother   ? Alcohol abuse Maternal Grandfather   ? ? ?Social History:  ?Social History  ? ?Socioeconomic History  ? Marital status: Married  ?  Spouse name: Not on file  ? Number of children: Not on file  ? Years of education: Not on file  ? Highest education level: Not on file  ?Occupational History  ? Not on file  ?Tobacco Use  ? Smoking status: Every Day  ?  Packs/day: 3.00  ?  Types: Cigarettes  ? Smokeless tobacco:  Never  ?Vaping Use  ? Vaping Use: Never used  ?Substance and Sexual Activity  ? Alcohol use: Yes  ?  Alcohol/week: 4.0 standard drinks  ?  Types: 3 Cans of beer, 1 Shots of liquor per week  ?  Comment: pt reports she drincks daily  ? Drug use: Not on file  ?  Comment: pt reports she uses daily  ? Sexual activity: Yes  ?  Partners: Male  ?  Birth control/protection: Injection, Condom  ?Other Topics Concern  ? Not on file  ?Social History Narrative  ? Not on file  ? ?Social Determinants of Health  ? ?Financial Resource Strain: Not on file   ?Food Insecurity: Not on file  ?Transportation Needs: Not on file  ?Physical Activity: Not on file  ?Stress: Not on file  ?Social Connections: Not on file  ? ? ?Allergies:  ?Allergies  ?Allergen Reactions  ? Bee Pollen Hives  ? Chlorpromazine Anaphylaxis  ? Other Swelling  ? Oxycodone-Acetaminophen Hives  ? Peanut Oil Anaphylaxis and Hives  ? Pineapple Swelling  ? Promethazine Anaphylaxis and Nausea And Vomiting  ? ? ?Metabolic Disorder Labs: ?Lab Results  ?Component Value Date  ? HGBA1C 5.5 01/13/2020  ? MPG 111 01/13/2020  ? ?No results found for: PROLACTIN ?Lab Results  ?Component Value Date  ? CHOL 161 01/13/2020  ? TRIG 87 01/13/2020  ? HDL 70 01/13/2020  ? CHOLHDL 2.3 01/13/2020  ? VLDL 17 01/13/2020  ? LDLCALC 74 01/13/2020  ? ?Lab Results  ?Component Value Date  ? TSH 0.995 01/13/2020  ? ? ?Therapeutic Level Labs: ?No results found for: LITHIUM ?No results found for: VALPROATE ?No components found for:  CBMZ ? ?Current Medications: ?Current Outpatient Medications  ?Medication Sig Dispense Refill  ? benztropine (COGENTIN) 0.5 MG tablet Take 1 tablet (0.5 mg total) by mouth 2 (two) times daily. 60 tablet 2  ? albuterol (VENTOLIN HFA) 108 (90 Base) MCG/ACT inhaler Inhale 1-2 puffs into the lungs every 6 (six) hours as needed for wheezing or shortness of breath. 6.7 g 0  ? dicyclomine (BENTYL) 20 MG tablet Take 20 mg by mouth every 6 (six) hours as needed for cramping. IBS    ? ondansetron (ZOFRAN) 4 MG tablet Take 1 tablet (4 mg total) by mouth every 8 (eight) hours as needed for nausea or vomiting. 20 tablet 0  ? valACYclovir (VALTREX) 500 MG tablet Take 1 tablet (500 mg total) by mouth 2 (two) times daily. 14 tablet 0  ? VIVITROL 380 MG SUSR 380 mg by Subdermal route every 28 (twenty-eight) days.    ? ?Current Facility-Administered Medications  ?Medication Dose Route Frequency Provider Last Rate Last Admin  ? [START ON 06/26/2021] ARIPiprazole ER (ABILIFY MAINTENA) 400 MG prefilled syringe 400 mg  400 mg  Intramuscular Q28 days Mcneil Sober, NP      ? ? ? ?Musculoskeletal: ?Strength & Muscle Tone: N/A virtual visit ?Gait & Station: N/A virtual visit ?Patient leans: N/A ? ?Psychiatric Specialty Exam: ?Review of Systems  ?Psychiatric/Behavioral:  Negative for hallucinations, self-injury and suicidal ideas.   ?All other systems reviewed and are negative.  ?There were no vitals taken for this visit.There is no height or weight on file to calculate BMI.  ?General Appearance: N/A  ?Eye Contact: N/A  ?Speech: Clear and coherent  ?Volume: Normal  ?Mood: Euthymic  ?Affect: N/A  ?Thought Process:  Goal Directed  ?Orientation:  Full (Time, Place, and Person)  ?Thought Content: Logical   ?Suicidal Thoughts:  No  ?  Homicidal Thoughts:  No  ?Memory: Good  ?Judgement: Good  ?Insight: Good  ?Psychomotor Activity: N/A  ?Concentration: Good  ?Recall: Good  ?Fund of Knowledge: Good  ?Language: Good  ?Akathisia: Teeth chattering  ?Handed:  Right  ?AIMS (if indicated): done  ?Assets:  Communication Skills ?Desire for Improvement  ?ADL's:  Intact  ?Cognition: WNL  ?Sleep:  Good  ? ?Screenings: ?AIMS   ? ?[facility]sheet [name]  ?AIMS Total Score 0  ? ?  ? ?AUDIT   ? ?Flowsheet Row Ad[name]  ?Alcohol Use Disorder Identification Test Final Score (AUDIT) 11  ? ?  ? ?GAD-7   ? ?Flowsheet Row [facility]  ?Total GAD-7 Score 19  ? ?  ? ?PHQ2-9   ? ?Flowsheet Row [facility]  ?PHQ-2 Total Score 6 4 0 2 4  ?PHQ-9 Total  Score 22 19 12 15 13   ? ?  ? ?Flowsheet Row [facility] from 11/16/2020 in [facility]

## 2021-06-26 ENCOUNTER — Ambulatory Visit (HOSPITAL_COMMUNITY): Payer: Medicaid Other

## 2021-07-03 ENCOUNTER — Ambulatory Visit (HOSPITAL_COMMUNITY): Payer: Medicaid Other

## 2021-07-24 ENCOUNTER — Telehealth (HOSPITAL_COMMUNITY): Payer: Self-pay | Admitting: *Deleted

## 2021-07-24 DIAGNOSIS — F319 Bipolar disorder, unspecified: Secondary | ICD-10-CM

## 2021-07-24 MED ORDER — ABILIFY MAINTENA 400 MG IM PRSY: 400.0000 mg | PREFILLED_SYRINGE | INTRAMUSCULAR | 3 refills | Status: DC

## 2021-07-24 NOTE — Telephone Encounter (Signed)
Call from patient asking for an appt to come get her shot. She missed her shot on 07/03/21. She also said she accidentally threw her cogentin away and is having difficulty with her mouth without it. Also asking for her zofran. I called her back and she will get her shot thurs, discuss meds with provider and told her I would call her abilify in and she has a refill on her cogentin and we will not write a rx for her zofran that is out of our scope.  ?

## 2021-07-24 NOTE — Telephone Encounter (Signed)
Patient called in to request a medication refill. Patient provided with a 3 refills for Abilify Maintena sent electronically to patient's pharmacy on file. ?

## 2021-07-25 ENCOUNTER — Telehealth (HOSPITAL_COMMUNITY): Payer: Self-pay | Admitting: Licensed Clinical Social Worker

## 2021-07-26 ENCOUNTER — Ambulatory Visit (HOSPITAL_COMMUNITY): Payer: Medicaid Other

## 2021-08-01 ENCOUNTER — Telehealth (HOSPITAL_COMMUNITY): Payer: Self-pay | Admitting: *Deleted

## 2021-08-01 NOTE — Telephone Encounter (Signed)
VM from patient stating she couldn't get her abilify shot. I called pharmacy they said they just needed to override it or it needed a PA> if PA needed witll send me a request. Paitents vm she sounded impaired. When I called her back she sounded fine but states she has issues with her mouth and it comes and goes. Want her to be seen by a provider tomorrow so she will come after 12 to eval need for medication to be changed or added. She will come if she cant or can get her med picked up, I will use a sample and she can pay back the sample next visit.  ?

## 2021-08-02 ENCOUNTER — Ambulatory Visit (HOSPITAL_COMMUNITY): Payer: Self-pay | Admitting: Licensed Clinical Social Worker

## 2021-08-02 ENCOUNTER — Ambulatory Visit (HOSPITAL_COMMUNITY): Payer: Medicaid Other

## 2021-09-04 ENCOUNTER — Encounter: Payer: Self-pay | Admitting: *Deleted

## 2021-09-11 ENCOUNTER — Other Ambulatory Visit (HOSPITAL_COMMUNITY): Payer: Self-pay | Admitting: Psychiatry

## 2021-09-11 ENCOUNTER — Telehealth (INDEPENDENT_AMBULATORY_CARE_PROVIDER_SITE_OTHER): Payer: Medicaid Other | Admitting: Psychiatry

## 2021-09-11 ENCOUNTER — Encounter (HOSPITAL_COMMUNITY): Payer: Self-pay | Admitting: Psychiatry

## 2021-09-11 DIAGNOSIS — G2401 Drug induced subacute dyskinesia: Secondary | ICD-10-CM

## 2021-09-11 DIAGNOSIS — F314 Bipolar disorder, current episode depressed, severe, without psychotic features: Secondary | ICD-10-CM

## 2021-09-11 MED ORDER — VENLAFAXINE HCL ER 75 MG PO CP24: 75.0000 mg | ORAL_CAPSULE | Freq: Every day | ORAL | 3 refills | Status: DC

## 2021-09-11 MED ORDER — VALBENAZINE TOSYLATE 60 MG PO CAPS: 60.0000 mg | ORAL_CAPSULE | Freq: Every day | ORAL | 30 refills | Status: DC

## 2021-09-11 MED ORDER — RISPERIDONE 0.5 MG PO TABS: 0.5000 mg | ORAL_TABLET | Freq: Every day | ORAL | 3 refills | Status: DC

## 2021-09-11 MED ORDER — BENZTROPINE MESYLATE 0.5 MG PO TABS: 0.5000 mg | ORAL_TABLET | Freq: Three times a day (TID) | ORAL | 1 refills | Status: DC

## 2021-09-11 NOTE — Progress Notes (Signed)
BH MD/PA/NP OP Progress Note Virtual Visit via Video Note  I connected with Darlene Gates on 09/11/21 at 10:30 AM EDT by a video enabled telemedicine application and verified that I am speaking with the correct person using two identifiers.  Location: Patient: Home Provider: Clinic   I discussed the limitations of evaluation and management by telemedicine and the availability of in person appointments. The patient expressed understanding and agreed to proceed.  I provided 30 minutes of non-face-to-face time during this encounter.   09/11/2021 12:07 PM Darlene Gates  MRN:  [id number]  Chief Complaint: "The Abilify has my face moving weird"  HPI: 36 year old female seen today for follow-up psychiatric evaluation.  She has a psychiatric history of bipolar disorder, depression, anxiety, cluster B personality disorder, PTSD, tobacco dependence, polysubstance use (amphetamines, alcohol, opioids, and cocaine).  She is currently managed on Abilify M 400 mg every 28 days IM and Cogentin 0.5 mg twice daily.  She reports that she has not received her Abilify injection since March and reports that she has been experiencing TD.  Today she is well-groomed, pleasant, cooperative, engaged in conversation, maintained eye contact.  She informed Clinical research associate that since her last visit she has been having abnormal facial movements and reports that Cogentin is not effectively treating them.  She informed Clinical research associate that she increased her doses to 0.5 mg 3 times daily.  Patient is noted to have facial twitching around mouth.  She notes at times she drools  and has difficulties speaking.  Since being off of Abilify patient notes that she is irritable, has racing thoughts, fluctuations in mood, and has auditory hallucinations.  She notes that her voices tell her to do negative things.  Today she denies SI/HI/VH.  At times she notes that she is paranoid.  She informed Clinical research associate that her hallucinations tell her that people  are out to get her.  Patient notes that the above exacerbates her anxiety and depression.  Provider conducted a GAD-7 and she scored 21.  She notes that she worries about her children (45-year-old son and 70 year old daughter).  Provider also conducted PHQ-9 and patient scored 22.  She notes that her sleep and appetite fluctuates.  Patient notes that she sleeps approximately 4 to 5 hours nightly.  At this time patient request to discontinue Abilify.  For now Cogentin maintained at 0.5 mg 3 times daily.  Provider filled Ingrezza 60 mg to help manage symptoms of TD.  Provider informed patient that medication may have to go through prior authorization prior to her getting it.  She endorsed understanding and agreed.  Patient notes that she found Effexor effective in managing her anxiety and depression in the past and is agreeable to restart Effexor 75 mg daily.  Abilify discontinued and Risperdal 0.5 mg nightly started to help manage sleep and symptoms of psychosis.  No other concerns at this time. Visit Diagnosis:    ICD-10-CM   1. Tardive dyskinesia  G24.01 valbenazine (INGREZZA) 60 MG capsule    benztropine (COGENTIN) 0.5 MG tablet    2. Bipolar 1 disorder, depressed, severe (HCC)  F31.4 risperiDONE (RISPERDAL) 0.5 MG tablet    venlafaxine XR (EFFEXOR XR) 75 MG 24 hr capsule      Past Psychiatric History: bipolar disorder, depression, anxiety, cluster B personality disorder, PTSD, tobacco dependence, polysubstance use (amphetamines, alcohol, opioids, and cocaine).  Past Medical History:  Past Medical History:  Diagnosis Date   Anxiety    Bipolar 1 disorder (HCC)    Depression  History of alcohol abuse    History of substance abuse (HCC)    History reviewed. No pertinent surgical history.  Family Psychiatric History: Mother alcohol use, bipolar disorder, and depression, father alcohol use, maternal grandmother with alcohol use, bipolar disorder, maternal grandfather alcohol use  Family  History:  Family History  Problem Relation Age of Onset   Alcohol abuse Mother    Bipolar disorder Mother    Depression Mother    Alcohol abuse Father    Hyperlipidemia Father    Hypertension Father    Alcohol abuse Maternal Grandmother    Bipolar disorder Maternal Grandmother    Alcohol abuse Maternal Grandfather     Social History:  Social History   Socioeconomic History   Marital status: Married    Spouse name: Not on file   Number of children: Not on file   Years of education: Not on file   Highest education level: Not on file  Occupational History   Not on file  Tobacco Use   Smoking status: Every Day    Packs/day: 3.00    Types: Cigarettes   Smokeless tobacco: Never  Vaping Use   Vaping Use: Never used  Substance and Sexual Activity   Alcohol use: Yes    Alcohol/week: 4.0 standard drinks of alcohol    Types: 3 Cans of beer, 1 Shots of liquor per week    Comment: pt reports she drincks daily   Drug use: Not on file    Comment: pt reports she uses daily   Sexual activity: Yes    Partners: Male    Birth control/protection: Injection, Condom  Other Topics Concern   Not on file  Social History Narrative   Not on file   Social Determinants of Health   Financial Resource Strain: Not on file  Food Insecurity: Not on file  Transportation Needs: Not on file  Physical Activity: Not on file  Stress: Not on file  Social Connections: Not on file    Allergies:  Allergies  Allergen Reactions   Bee Pollen Hives   Chlorpromazine Anaphylaxis   Other Swelling   Oxycodone-Acetaminophen Hives   Peanut Oil Anaphylaxis and Hives   Pineapple Swelling   Promethazine Anaphylaxis and Nausea And Vomiting    Metabolic Disorder Labs: Lab Results  Component Value Date   HGBA1C 5.5 01/13/2020   MPG 111 01/13/2020   No results found for: "PROLACTIN" Lab Results  Component Value Date   CHOL 161 01/13/2020   TRIG 87 01/13/2020   HDL 70 01/13/2020   CHOLHDL 2.3  01/13/2020   VLDL 17 01/13/2020   LDLCALC 74 01/13/2020   Lab Results  Component Value Date   TSH 0.995 01/13/2020    Therapeutic Level Labs: No results found for: "LITHIUM" No results found for: "VALPROATE" No results found for: "CBMZ"  Current Medications: Current Outpatient Medications  Medication Sig Dispense Refill   risperiDONE (RISPERDAL) 0.5 MG tablet Take 1 tablet (0.5 mg total) by mouth at bedtime. 30 tablet 3   valbenazine (INGREZZA) 60 MG capsule Take 1 capsule (60 mg total) by mouth daily. 30 capsule 30   venlafaxine XR (EFFEXOR XR) 75 MG 24 hr capsule Take 1 capsule (75 mg total) by mouth daily. 30 capsule 3   albuterol (VENTOLIN HFA) 108 (90 Base) MCG/ACT inhaler Inhale 1-2 puffs into the lungs every 6 (six) hours as needed for wheezing or shortness of breath. 6.7 g 0   benztropine (COGENTIN) 0.5 MG tablet Take 1 tablet (0.5 mg  total) by mouth 3 (three) times daily. 90 tablet 1   dicyclomine (BENTYL) 20 MG tablet Take 20 mg by mouth every 6 (six) hours as needed for cramping. IBS     ondansetron (ZOFRAN) 4 MG tablet Take 1 tablet (4 mg total) by mouth every 8 (eight) hours as needed for nausea or vomiting. 20 tablet 0   valACYclovir (VALTREX) 500 MG tablet Take 1 tablet (500 mg total) by mouth 2 (two) times daily. 14 tablet 0   VIVITROL 380 MG SUSR 380 mg by Subdermal route every 28 (twenty-eight) days.     No current facility-administered medications for this visit.     Musculoskeletal: Strength & Muscle Tone: within normal limits and telehealth visit Gait & Station: normal, telehealth visit Patient leans: N/A  Psychiatric Specialty Exam: Review of Systems  There were no vitals taken for this visit.There is no height or weight on file to calculate BMI.  General Appearance: Well Groomed  Eye Contact:  Good  Speech:  Clear and Coherent and Normal Rate  Volume:  Normal  Mood:  Anxious and Depressed  Affect:  Appropriate and Congruent  Thought Process:   Coherent, Goal Directed, and Linear  Orientation:  Full (Time, Place, and Person)  Thought Content: Logical, Hallucinations: Auditory, and Paranoid Ideation   Suicidal Thoughts:  No  Homicidal Thoughts:  No  Memory:  Immediate;   Good Recent;   Good Remote;   Good  Judgement:  Good  Insight:  Good  Psychomotor Activity:  TD  Concentration:  Concentration: Good and Attention Span: Good  Recall:  Good  Fund of Knowledge: Good  Language: Good  Akathisia:  No  Handed:  Right  AIMS (if indicated): Done, 4  Assets:  Communication Skills Desire for Improvement Financial Resources/Insurance Housing Physical Health Social Support  ADL's:  Intact  Cognition: WNL  Sleep:  Fair   Screenings: AIMS    Flowsheet Row Video Visit from 09/11/2021 in [name]  AIMS Total Score 4 0      AUDIT    Flowsheet Row [name]  Alcohol Use Disorder Identification Test Final Score (AUDIT) 11      GAD-7    Flowsheet Row Video Visit from 09/11/2021 in [facility] Office Visit from 05/21/2021 in [facility]  Total GAD-7 Score 21 19      PHQ2-9    Flowsheet Row Video Visit from 09/11/2021 in [facility] Office Visit from 05/21/2021 in [facility] Office Visit from 04/24/2021 in [facility] Office Visit from 04/12/2020 in [facility] Office Visit from 03/14/2020 in [facility]  PHQ-2 Total Score 6 6 4  0 2  PHQ-9 Total Score 22 22 19 12 15       Flowsheet Row Office Visit from 05/21/2021 in [facility] ED from 05/13/2021 in [facility] from 11/16/2020 in [facility]  C-SSRS RISK CATEGORY Error: Q6 is Yes, you must answer 7 No Risk Moderate Risk        Assessment and Plan: Patient endorses symptoms of TD, anxiety, depression, hypomania, AH, and paranoia. At this time patient request to discontinue Abilify.  For now Cogentin maintained at 0.5 mg 3 times daily.  Provider filled  Ingrezza 60 mg to help manage symptoms of TD.  Provider informed patient that medication may have to go through prior authorization prior to her getting it.  She endorsed understanding and agreed.  Patient notes that she found Effexor effective in managing her anxiety and depression in the past and is agreeable to restart Effexor 75 mg daily.  Abilify discontinued and Risperdal 0.5 mg nightly started to help manage sleep and symptoms of psychosis  1. Tardive dyskinesia  Start- valbenazine (INGREZZA) 60 MG capsule; Take 1 capsule (60 mg total) by mouth daily.  Dispense: 30 capsule; Refill: 30 Discontinue when Ingrezza is received- benztropine (COGENTIN) 0.5 MG tablet; Take 1 tablet (0.5 mg total) by mouth 3 (three) times daily.  Dispense: 90 tablet; Refill: 1  2. Bipolar 1 disorder, depressed, severe (HCC)  Start- risperiDONE (RISPERDAL) 0.5 MG tablet; Take 1 tablet (0.5 mg total) by mouth at bedtime.  Dispense: 30 tablet; Refill: 3 Start- venlafaxine XR (EFFEXOR XR) 75 MG 24 hr capsule; Take 1 capsule (75 mg total) by mouth daily.  Dispense: 30 capsule; Refill: 3     Collaboration of Care: Collaboration of Care: Other provider involved in patient's care AEB    Patient/Guardian was advised Release of Information must be obtained prior to any record release in order to collaborate their care with an outside provider. Patient/Guardian was advised if they have not already done so to contact the registration department to sign all necessary forms in order for [place] to release information regarding their care.   Consent: Patient/Guardian gives verbal  consent for treatment and assignment of benefits for services provided during this visit. Patient/Guardian expressed understanding and agreed to proceed.    Shanna Cisco, NP 09/11/2021, 12:07 PM

## 2021-09-19 ENCOUNTER — Telehealth (HOSPITAL_COMMUNITY): Payer: Self-pay | Admitting: *Deleted

## 2021-09-19 NOTE — Telephone Encounter (Signed)
Request for a PA to be submitted for her Ingrezza. Submitted via fax to [employer]

## 2021-09-20 ENCOUNTER — Telehealth (HOSPITAL_COMMUNITY): Payer: Self-pay | Admitting: *Deleted

## 2021-09-20 NOTE — Telephone Encounter (Signed)
PA submitted yesterday for patients Ingrezza. PA was approved #[id number] for 6 months until 03/18/22. Pharmacy notified.

## 2021-10-10 ENCOUNTER — Encounter (HOSPITAL_COMMUNITY): Payer: Self-pay

## 2021-10-10 ENCOUNTER — Emergency Department (HOSPITAL_COMMUNITY)
Admission: EM | Admit: 2021-10-10 | Discharge: 2021-10-10 | Disposition: A | Discharge status: Home / Self Care | Payer: Medicaid Other | Attending: Emergency Medicine | Admitting: Emergency Medicine

## 2021-10-10 ENCOUNTER — Other Ambulatory Visit: Payer: Self-pay

## 2021-10-10 ENCOUNTER — Emergency Department (HOSPITAL_COMMUNITY): Payer: Medicaid Other

## 2021-10-10 DIAGNOSIS — O2686 Pruritic urticarial papules and plaques of pregnancy (PUPPP): Secondary | ICD-10-CM | POA: Diagnosis present

## 2021-10-10 DIAGNOSIS — O021 Missed abortion: Secondary | ICD-10-CM | POA: Diagnosis not present

## 2021-10-10 DIAGNOSIS — T7840XA Allergy, unspecified, initial encounter: Secondary | ICD-10-CM | POA: Diagnosis not present

## 2021-10-10 LAB — URINALYSIS, ROUTINE W REFLEX MICROSCOPIC
Bilirubin Urine: NEGATIVE
Glucose, UA: NEGATIVE mg/dL
Ketones, ur: NEGATIVE mg/dL
Leukocytes,Ua: NEGATIVE
Nitrite: NEGATIVE
Protein, ur: NEGATIVE mg/dL
Specific Gravity, Urine: 1.008 (ref 1.005–1.030)
pH: 6 (ref 5.0–8.0)

## 2021-10-10 LAB — POC URINE PREG, ED: Preg Test, Ur: POSITIVE — AB

## 2021-10-10 LAB — HCG, QUANTITATIVE, PREGNANCY: hCG, Beta Chain, Quant, S: 35725 m[IU]/mL — ABNORMAL HIGH (ref <5)

## 2021-10-10 MED ORDER — METHYLPREDNISOLONE SODIUM SUCC 125 MG IJ SOLR
125.0000 mg | Freq: Once | INTRAMUSCULAR | Status: AC
  Administered 2021-10-10: 125 mg via INTRAVENOUS
  Filled 2021-10-10: qty 2

## 2021-10-10 MED ORDER — MISOPROSTOL 200 MCG PO TABS: 200.0000 ug | ORAL_TABLET | Freq: Once | ORAL | Status: DC

## 2021-10-10 MED ORDER — IBUPROFEN 400 MG PO TABS
600.0000 mg | ORAL_TABLET | Freq: Once | ORAL | Status: AC
  Administered 2021-10-10: 600 mg via ORAL
  Filled 2021-10-10: qty 1

## 2021-10-10 MED ORDER — PREDNISONE 20 MG PO TABS: 40.0000 mg | ORAL_TABLET | Freq: Every day | ORAL | 0 refills | Status: DC

## 2021-10-10 MED ORDER — MISOPROSTOL 200 MCG PO TABS
800.0000 ug | ORAL_TABLET | Freq: Once | ORAL | Status: AC
  Administered 2021-10-10: 800 ug via BUCCAL
  Filled 2021-10-10: qty 4

## 2021-10-10 MED ORDER — ACETAMINOPHEN-CODEINE 300-30 MG PO TABS: 1.0000 | ORAL_TABLET | Freq: Four times a day (QID) | ORAL | 0 refills | Status: DC | PRN

## 2021-10-10 MED ORDER — FAMOTIDINE IN NACL 20-0.9 MG/50ML-% IV SOLN
20.0000 mg | Freq: Once | INTRAVENOUS | Status: AC
  Administered 2021-10-10: 20 mg via INTRAVENOUS
  Filled 2021-10-10: qty 50

## 2021-10-10 NOTE — ED Provider Notes (Signed)
Patient has been given Cytotec.  She will be discharged.  Follow-up with OB.  Prescriptions written by Dr. Anitra Lauth.   Pricilla Loveless, MD 10/10/21 1539

## 2021-10-10 NOTE — Discharge Instructions (Addendum)
You need to avoid any further bug spray.  You will take prednisone for the next 5 days to help with reaction.  The ultrasound showed that unfortunately the baby does not have a heartbeat and you have miscarried.  You took a medication here that will help speed up the process to expel the baby.  You will experience cramping and bleeding and it usually kicks in with about 24 hours after you take the medication.  If you start having severe cramping and heavy bleeding return to the [facility] that is located within [facility] for further care.  Otherwise they will call you for a follow-up

## 2021-10-10 NOTE — ED Notes (Signed)
Plunkett at bedside to see patient and clear for MAU

## 2021-10-10 NOTE — ED Triage Notes (Signed)
2 nights ago patient was bitten by several mosquitos and last night used off insect repellent and now has hives all over body with mild periorbital eye swelling and complains of mild shortness of breath.  Lung sounds clear. EMS gave 50mg  PO benadrl.

## 2021-10-10 NOTE — ED Provider Notes (Signed)
[facility] EMERGENCY DEPARTMENT Provider Note   CSN: [id number] Arrival date & time: 10/10/21  0740     History  Chief Complaint  Patient presents with   Allergic Reaction    Darlene Gates is a 36 y.o. female.  Patient is a 36 year old female who is a G7, P2 with 4 miscarriages or stillborns who was last pregnancy was carried to full-term but she required progesterone shots and cerclage who is presenting today initially because of findings concerning for an allergic reaction.  She reports being bit by mosquito last night and then she used off and started breaking out with an itchy raised rash that is spread to her face, chest, back and extremities.  She denies any shortness of breath, wheezing or mouth or tongue swelling.   Secondly patient reports she has been having pain in her abdomen.  She thinks she is between 14 to [redacted] weeks pregnant.  She has not received any prenatal care and has not sure when her last menstrual cycle is because she has had intermittent spotting.  Over the last 2 days she has been having pain on the left side of her abdomen that then moves over to the right side of the abdomen and is tight.  It is occurring every 2-3 hours for the last 2 days.  She has not noticed any spotting during this time.  She denies any urinary symptoms.  She has been feeling baby move intermittently.  The history is provided by the patient.  Allergic Reaction Presenting symptoms: itching and rash   Severity:  Severe Duration:  12 hours Prior allergic episodes:  Allergies to medications Context: insect bite/sting   Context comment:  Pt was bit by mosquitos last night and then used off and started breaking out in a rash.  no SOB, wheezing, throat, tongue or lip swelling Relieved by:  Nothing Worsened by:  Nothing Ineffective treatments:  None tried      Home Medications Prior to Admission medications   Medication Sig Start Date End Date Taking? Authorizing  Provider  acetaminophen-codeine (TYLENOL #3) 300-30 MG tablet Take 1 tablet by mouth every 6 (six) hours as needed for moderate pain. 10/10/21  Yes Roneshia Drew, Alphonzo Lemmings, MD  predniSONE (DELTASONE) 20 MG tablet Take 2 tablets (40 mg total) by mouth daily. 10/10/21  Yes Astrid Vides, Alphonzo Lemmings, MD  albuterol (VENTOLIN HFA) 108 (90 Base) MCG/ACT inhaler Inhale 1-2 puffs into the lungs every 6 (six) hours as needed for wheezing or shortness of breath. 01/21/20   Aldean Baker, NP  benztropine (COGENTIN) 0.5 MG tablet TAKE 1 TABLET(0.5 MG) BY MOUTH THREE TIMES DAILY 09/11/21   Toy Cookey E, NP  dicyclomine (BENTYL) 20 MG tablet Take 20 mg by mouth every 6 (six) hours as needed for cramping. IBS 11/03/20   [provider]  ondansetron (ZOFRAN) 4 MG tablet Take 1 tablet (4 mg total) by mouth every 8 (eight) hours as needed for nausea or vomiting. 04/24/21   Towanda Octave, MD  risperiDONE (RISPERDAL) 0.5 MG tablet Take 1 tablet (0.5 mg total) by mouth at bedtime. 09/11/21   Shanna Cisco, NP  valACYclovir (VALTREX) 500 MG tablet Take 1 tablet (500 mg total) by mouth 2 (two) times daily. 02/16/20   Storm Frisk, MD  valbenazine [facility]) 60 MG capsule Take 1 capsule (60 mg total) by mouth daily. 09/11/21   Shanna Cisco, NP  venlafaxine XR (EFFEXOR XR) 75 MG 24 hr capsule Take 1 capsule (75 mg  total) by mouth daily. 09/11/21   Shanna Cisco, NP  VIVITROL 380 MG SUSR 380 mg by Subdermal route every 28 (twenty-eight) days. 10/06/20   [provider]      Allergies    Bee pollen, Chlorpromazine, Other, Oxycodone-acetaminophen, Peanut oil, Pineapple, and Promethazine    Review of Systems   Review of Systems  Skin:  Positive for itching and rash.    Physical Exam Updated Vital Signs BP 121/68 (BP Location: Right Arm)   Pulse 90   Temp 98.5 F (36.9 C) (Oral)   Resp (!) 21   Ht 5' (1.524 m)   Wt 72.6 kg   LMP  (LMP Unknown)   SpO2 97%   BMI 31.25 kg/m  Physical  Exam Vitals and nursing note reviewed.  Constitutional:      General: She is in acute distress.     Appearance: She is well-developed.  HENT:     Head: Normocephalic and atraumatic.     Mouth/Throat:     Mouth: Mucous membranes are moist.     Comments: No evidence of tongue swelling, uvula or pharyngeal swelling Eyes:     Pupils: Pupils are equal, round, and reactive to light.  Cardiovascular:     Rate and Rhythm: Normal rate and regular rhythm.     Heart sounds: Normal heart sounds. No murmur heard.    No friction rub.  Pulmonary:     Effort: Pulmonary effort is normal.     Breath sounds: Normal breath sounds. No wheezing or rales.  Abdominal:     General: Bowel sounds are normal. There is no distension.     Palpations: Abdomen is soft.     Tenderness: There is no abdominal tenderness. There is no guarding or rebound.     Comments: Gravid uterus to the umbilicus  Musculoskeletal:        General: No tenderness. Normal range of motion.     Comments: No edema  Skin:    General: Skin is warm and dry.     Findings: Rash present.     Comments: Urticarial rash present generalized over the body with some swelling of the hands bilaterally and excoriation.  Neurological:     Mental Status: She is alert and oriented to person, place, and time. Mental status is at baseline.     Cranial Nerves: No cranial nerve deficit.  Psychiatric:        Behavior: Behavior normal.     ED Results / Procedures / Treatments   Labs (all labs ordered are listed, but only abnormal results are displayed) Labs Reviewed  HCG, QUANTITATIVE, PREGNANCY - Abnormal; Notable for the following components:      Result Value   hCG, Beta Chain, Quant, S 35,725 (*)    All other components within normal limits  URINALYSIS, ROUTINE W REFLEX MICROSCOPIC - Abnormal; Notable for the following components:   Hgb urine dipstick MODERATE (*)    Bacteria, UA RARE (*)    All other components within normal limits  POC  URINE PREG, ED - Abnormal; Notable for the following components:   Preg Test, Ur POSITIVE (*)    All other components within normal limits    EKG None  Radiology US OB LESS THAN 14 WEEKS WITH OB TRANSVAGINAL  Result Date: 10/10/2021 CLINICAL DATA:  Pelvic pain.  Positive beta HCG EXAM: OBSTETRIC <14 WK [place] AND TRANSVAGINAL OB US TECHNIQUE: Both transabdominal and transvaginal ultrasound examinations were performed for complete evaluation of the gestation  as well as the maternal uterus, adnexal regions, and pelvic cul-de-sac. Transvaginal technique was performed to assess early pregnancy. COMPARISON:  None Available. FINDINGS: Intrauterine gestational sac: Single, irregularly shaped. Yolk sac:  Visualized. Embryo:  Visualized. Cardiac Activity: Not Visualized. Heart Rate: 0 bpm CRL:  9.6 mm   7 w   0 d                  [place] EDC: 05/29/2022 Subchorionic hemorrhage:  None visualized. Maternal uterus/adnexae: Rounded hypoechoic fibroid within the anterior uterine body at the lower uterine segment measuring 1.0 x 0.8 x 0.8 cm. Bilateral ovaries and adnexal regions within normal limits. No free fluid within the pelvis. IMPRESSION: 1. Irregular-appearing intrauterine gestational sac containing an approximately 7 week embryo with no cardiac activity. Findings meet definitive criteria for failed pregnancy. This follows SRU consensus guidelines: Diagnostic Criteria for Nonviable Pregnancy Early in the First Trimester. [employer] Mis J Med [phone]. 2. Small anterior uterine body fibroid at the lower uterine segment. Electronically Signed   By: Duanne Guess D.O.   On: 10/10/2021 11:18    Procedures Procedures    Medications Ordered in ED Medications  misoprostol (CYTOTEC) tablet 800 mcg (has no administration in time range)  methylPREDNISolone sodium succinate (SOLU-MEDROL) 125 mg/2 mL injection 125 mg (125 mg Intravenous Given 10/10/21 0816)  famotidine (PEPCID) IVPB 20 mg premix (0 mg Intravenous Stopped  10/10/21 1440)    ED Course/ Medical Decision Making/ A&P                           Medical Decision Making Amount and/or Complexity of Data Reviewed Labs: ordered. Decision-making details documented in ED Course. Radiology: ordered and independent interpretation performed. Decision-making details documented in ED Course.  Risk Prescription drug management.   36 year old female presenting today with 2 separate issues.  Patient does appear to be having an allergic reaction with urticaria generalized over her body.  No airway involvement at this time or evidence concerning for anaphylaxis.  Patient received Benadryl by EMS IM but is still having diffuse itching.  Patient covered with Solu-Medrol and Pepcid IV. Secondly patient has an extensive OB history and reports she is currently 14 to [redacted] weeks pregnant.  She has had issues with preterm delivery and miscarriages related to early delivery.  She has required cerclage and progesterone shots in the past.  Patient has been complaining of some abdominal pain over the last 2 days which she thought was round ligament pain however it could be early labor and cramping.  She has received no prenatal care at this time.  She denies any vaginal bleeding.  On a quick bedside ultrasound patient appears to have a yolk sac but does not have evidence of a farther along gestation.  We will get an hCG, urine and do a formal ultrasound.  Patient did receive allergic medications.  Will reassess.  I independently interpreted patient's labs and hCG is positive at 35,000 with positive urine pregnancy test prior to that. I have independently visualized and interpreted pt's images today.  [place] with IUP but no heart rate.  Radiology reports failed 7 week pregnancy with no heart beat. Pt is O+.  Gave her the option of watchful waiting versus Cytotec therapy.  Patient desires to start Cytotec.  She was given a short prescription for pain medication.  Spoke with the MAU who will  follow-up with her as an outpatient.  Also patient given prednisone for her  allergic reaction.  Urticaria is significantly improved at this time.  Recommending avoiding bug spray in the future. Pt phone number is #[phone]       Final Clinical Impression(s) / ED Diagnoses Final diagnoses:  Allergic reaction, initial encounter  Missed abortion    Rx / DC Orders ED Discharge Orders          Ordered    predniSONE (DELTASONE) 20 MG tablet  Daily        10/10/21 1508    acetaminophen-codeine (TYLENOL #3) 300-30 MG tablet  Every 6 hours PRN        10/10/21 1508              Gwyneth Sprout, MD 10/10/21 1511

## 2021-10-30 ENCOUNTER — Other Ambulatory Visit: Payer: Self-pay

## 2021-10-31 ENCOUNTER — Ambulatory Visit (INDEPENDENT_AMBULATORY_CARE_PROVIDER_SITE_OTHER): Payer: Medicaid Other | Admitting: Certified Nurse Midwife

## 2021-10-31 DIAGNOSIS — O039 Complete or unspecified spontaneous abortion without complication: Secondary | ICD-10-CM

## 2021-10-31 NOTE — Progress Notes (Signed)
No show to appt.

## 2021-11-07 ENCOUNTER — Inpatient Hospital Stay (HOSPITAL_COMMUNITY)
Admission: AD | Admit: 2021-11-07 | Discharge: 2021-11-07 | Disposition: A | Discharge status: Home / Self Care | Payer: Medicaid Other | Attending: Emergency Medicine | Admitting: Emergency Medicine

## 2021-11-07 ENCOUNTER — Encounter (HOSPITAL_COMMUNITY): Payer: Self-pay | Admitting: *Deleted

## 2021-11-07 DIAGNOSIS — R109 Unspecified abdominal pain: Secondary | ICD-10-CM

## 2021-11-07 DIAGNOSIS — R110 Nausea: Secondary | ICD-10-CM

## 2021-11-07 DIAGNOSIS — R112 Nausea with vomiting, unspecified: Secondary | ICD-10-CM | POA: Diagnosis present

## 2021-11-07 DIAGNOSIS — Z3202 Encounter for pregnancy test, result negative: Secondary | ICD-10-CM | POA: Insufficient documentation

## 2021-11-07 LAB — CBC WITH DIFFERENTIAL/PLATELET
Abs Immature Granulocytes: 0.02 10*3/uL (ref 0.00–0.07)
Basophils Absolute: 0 10*3/uL (ref 0.0–0.1)
Basophils Relative: 1 %
Eosinophils Absolute: 0.1 10*3/uL (ref 0.0–0.5)
Eosinophils Relative: 1 %
HCT: 38.7 % (ref 36.0–46.0)
Hemoglobin: 13.3 g/dL (ref 12.0–15.0)
Immature Granulocytes: 0 %
Lymphocytes Relative: 38 %
Lymphs Abs: 2.3 10*3/uL (ref 0.7–4.0)
MCH: 32.1 pg (ref 26.0–34.0)
MCHC: 34.4 g/dL (ref 30.0–36.0)
MCV: 93.5 fL (ref 80.0–100.0)
Monocytes Absolute: 0.5 10*3/uL (ref 0.1–1.0)
Monocytes Relative: 8 %
Neutro Abs: 3 10*3/uL (ref 1.7–7.7)
Neutrophils Relative %: 52 %
Platelets: 270 10*3/uL (ref 150–400)
RBC: 4.14 MIL/uL (ref 3.87–5.11)
RDW: 12.9 % (ref 11.5–15.5)
WBC: 5.9 10*3/uL (ref 4.0–10.5)
nRBC: 0 % (ref 0.0–0.2)

## 2021-11-07 LAB — URINALYSIS, ROUTINE W REFLEX MICROSCOPIC
Bilirubin Urine: NEGATIVE
Glucose, UA: NEGATIVE mg/dL
Ketones, ur: NEGATIVE mg/dL
Leukocytes,Ua: NEGATIVE
Nitrite: NEGATIVE
Protein, ur: NEGATIVE mg/dL
Specific Gravity, Urine: 1.025 (ref 1.005–1.030)
pH: 5 (ref 5.0–8.0)

## 2021-11-07 LAB — COMPREHENSIVE METABOLIC PANEL
ALT: 19 U/L (ref 0–44)
AST: 27 U/L (ref 15–41)
Albumin: 3.4 g/dL — ABNORMAL LOW (ref 3.5–5.0)
Alkaline Phosphatase: 62 U/L (ref 38–126)
Anion gap: 9 (ref 5–15)
BUN: 7 mg/dL (ref 6–20)
CO2: 21 mmol/L — ABNORMAL LOW (ref 22–32)
Calcium: 8.2 mg/dL — ABNORMAL LOW (ref 8.9–10.3)
Chloride: 109 mmol/L (ref 98–111)
Creatinine, Ser: 0.73 mg/dL (ref 0.44–1.00)
GFR, Estimated: >60 mL/min (ref >60)
Glucose, Bld: 77 mg/dL (ref 70–99)
Potassium: 4 mmol/L (ref 3.5–5.1)
Sodium: 139 mmol/L (ref 135–145)
Total Bilirubin: 1 mg/dL (ref 0.3–1.2)
Total Protein: 6.1 g/dL — ABNORMAL LOW (ref 6.5–8.1)

## 2021-11-07 LAB — POCT PREGNANCY, URINE: Preg Test, Ur: NEGATIVE

## 2021-11-07 LAB — LIPASE, BLOOD: Lipase: 25 U/L (ref 11–51)

## 2021-11-07 MED ORDER — ONDANSETRON HCL 4 MG/2ML IJ SOLN
4.0000 mg | Freq: Once | INTRAMUSCULAR | Status: AC
Start: 2021-11-07 — End: 2021-11-07
  Administered 2021-11-07: 4 mg via INTRAVENOUS
  Filled 2021-11-07: qty 2

## 2021-11-07 MED ORDER — ONDANSETRON 4 MG PO TBDP: 4.0000 mg | ORAL_TABLET | Freq: Three times a day (TID) | ORAL | 0 refills | Status: DC | PRN

## 2021-11-07 MED ORDER — LACTATED RINGERS IV BOLUS
1000.0000 mL | Freq: Once | INTRAVENOUS | Status: AC
  Administered 2021-11-07: 1000 mL via INTRAVENOUS

## 2021-11-07 NOTE — Discharge Instructions (Addendum)
Your work up was reassuring. Follow up with your PCP. I have sent in zofran for your nausea. Your pregnancy test was negative.  Blood work did not show any concern for infection.  Please return to the emergency room for any worsening symptoms.

## 2021-11-07 NOTE — ED Notes (Signed)
Pt provided rx for Zofran written and verbal d/c instructions. Pt provided bus pass. Pt left via ambulation.

## 2021-11-07 NOTE — MAU Note (Signed)
Wynelle Bourgeois CNM in Triage to see pt and discuss plan of care. Pt will go to Main ED and NT helped her find her way over.
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
## 2021-11-07 NOTE — ED Provider Notes (Signed)
[facility] EMERGENCY DEPARTMENT Provider Note   CSN: [id number] Arrival date & time: 11/07/21  0106     History  Chief Complaint  Patient presents with   Emesis During Pregnancy    Darlene Gates is a 36 y.o. female.  35 year old female presents today for evaluation of nausea, and vomiting that she states has been ongoing for about a month.  She states its primarily present during the morning, and in the evening.  She states she has no issue with this throughout the day.  Denies abdominal pain, fever, dysuria, vaginal discharge, vaginal bleeding.  Of note patient had a miscarriage on 7/12.  Patient believes she is currently pregnant as she has had some unprotected sex.  She had a pregnancy test done at MAU which was negative.  She believes this was too early.  Denies abdominal pain.  The history is provided by the patient. No language interpreter was used.       Home Medications Prior to Admission medications   Medication Sig Start Date End Date Taking? Authorizing Provider  acetaminophen-codeine (TYLENOL #3) 300-30 MG tablet Take 1 tablet by mouth every 6 (six) hours as needed for moderate pain. 10/10/21   Blanchie Dessert, MD  albuterol (VENTOLIN HFA) 108 (90 Base) MCG/ACT inhaler Inhale 1-2 puffs into the lungs every 6 (six) hours as needed for wheezing or shortness of breath. 01/21/20   Connye Burkitt, NP  benztropine (COGENTIN) 0.5 MG tablet TAKE 1 TABLET(0.5 MG) BY MOUTH THREE TIMES DAILY 09/11/21   Eulis Canner E, NP  dicyclomine (BENTYL) 20 MG tablet Take 20 mg by mouth every 6 (six) hours as needed for cramping. IBS 11/03/20   [provider]  ondansetron (ZOFRAN) 4 MG tablet Take 1 tablet (4 mg total) by mouth every 8 (eight) hours as needed for nausea or vomiting. 04/24/21   [name], MD  predniSONE (DELTASONE) 20 MG tablet Take 2 tablets (40 mg total) by mouth daily. 10/10/21   Blanchie Dessert, MD  risperiDONE (RISPERDAL) 0.5 MG  tablet Take 1 tablet (0.5 mg total) by mouth at bedtime. 09/11/21   Salley Slaughter, NP  valACYclovir (VALTREX) 500 MG tablet Take 1 tablet (500 mg total) by mouth 2 (two) times daily. 02/16/20   Elsie Stain, MD  valbenazine [facility]) 60 MG capsule Take 1 capsule (60 mg total) by mouth daily. 09/11/21   Salley Slaughter, NP  venlafaxine XR (EFFEXOR XR) 75 MG 24 hr capsule Take 1 capsule (75 mg total) by mouth daily. 09/11/21   Salley Slaughter, NP  VIVITROL 380 MG SUSR 380 mg by Subdermal route every 28 (twenty-eight) days. 10/06/20   [provider]      Allergies    Bee pollen, Chlorpromazine, Other, Oxycodone-acetaminophen, Peanut oil, Pineapple, and Promethazine    Review of Systems   Review of Systems  Constitutional:  Negative for chills and fever.  Gastrointestinal:  Positive for nausea and vomiting. Negative for abdominal pain.  Genitourinary:  Negative for dysuria, pelvic pain, vaginal bleeding and vaginal discharge.  Neurological:  Negative for weakness and light-headedness.  All other systems reviewed and are negative.   Physical Exam Updated Vital Signs BP 114/75 (BP Location: Left Arm)   Pulse 77   Temp 99.4 F (37.4 C) (Oral)   Resp 18   Ht 5\' 4"  (1.626 m)   Wt 72.1 kg   LMP  (LMP Unknown)   SpO2 97%   BMI 27.29 kg/m  Physical Exam  Vitals and nursing note reviewed.  Constitutional:      General: She is not in acute distress.    Appearance: Normal appearance. She is not ill-appearing.  HENT:     Head: Normocephalic and atraumatic.     Nose: Nose normal.  Eyes:     General: No scleral icterus.    Extraocular Movements: Extraocular movements intact.     Conjunctiva/sclera: Conjunctivae normal.  Cardiovascular:     Rate and Rhythm: Normal rate and regular rhythm.     Pulses: Normal pulses.  Pulmonary:     Effort: Pulmonary effort is normal. No respiratory distress.     Breath sounds: Normal breath sounds. No wheezing or rales.   Abdominal:     General: There is no distension.     Tenderness: There is no abdominal tenderness. There is no right CVA tenderness, left CVA tenderness or guarding.  Musculoskeletal:        General: Normal range of motion.     Cervical back: Normal range of motion.     Right lower leg: No edema.     Left lower leg: No edema.  Skin:    General: Skin is warm and dry.  Neurological:     General: No focal deficit present.     Mental Status: She is alert. Mental status is at baseline.     ED Results / Procedures / Treatments   Labs (all labs ordered are listed, but only abnormal results are displayed) Labs Reviewed  URINALYSIS, ROUTINE W REFLEX MICROSCOPIC - Abnormal; Notable for the following components:      Result Value   APPearance HAZY (*)    Hgb urine dipstick MODERATE (*)    Bacteria, UA RARE (*)    All other components within normal limits  CBC WITH DIFFERENTIAL/PLATELET  COMPREHENSIVE METABOLIC PANEL  LIPASE, BLOOD  POCT PREGNANCY, URINE    EKG None  Radiology No results found.  Procedures Procedures    Medications Ordered in ED Medications  lactated ringers bolus 1,000 mL (has no administration in time range)  ondansetron (ZOFRAN) injection 4 mg (has no administration in time range)    ED Course/ Medical Decision Making/ A&P                           Medical Decision Making Risk Prescription drug management.   36 year old female presents today for evaluation of nausea and vomiting.  Ongoing for about a month.  Only in the mornings and evenings.  She believes she is pregnant.  Patient was evaluated at MAU and had negative pregnancy test.  UA without evidence of UTI.  Other blood work pending.  Overall patient is well-appearing.  CBC without leukocytosis or anemia.  CMP without acute concerns.  UA without evidence of UTI.  Urine pregnancy test negative.  Lipase within normal limits.  Abdomen benign.  Without tenderness or distention.  Without flank  tenderness.  Patient received IV fluids.  Reports improvement in symptoms.  Tolerating p.o. intake.  She is appropriate for discharge.  Discharged in stable condition.   Final Clinical Impression(s) / ED Diagnoses Final diagnoses:  Negative pregnancy test  Abdominal pain, unspecified abdominal location  Nausea    Rx / DC Orders ED Discharge Orders          Ordered    ondansetron (ZOFRAN-ODT) 4 MG disintegrating tablet  Every 8 hours PRN        11/07/21 1535  Marita Kansas, PA-C 11/07/21 1535    Alvira Monday, MD 11/07/21 [id number]

## 2021-11-11 ENCOUNTER — Other Ambulatory Visit: Payer: Self-pay

## 2021-11-11 ENCOUNTER — Encounter (HOSPITAL_COMMUNITY): Payer: Self-pay | Admitting: *Deleted

## 2021-11-11 ENCOUNTER — Emergency Department (HOSPITAL_COMMUNITY)
Admission: EM | Admit: 2021-11-11 | Discharge: 2021-11-12 | Disposition: A | Discharge status: Other Acute Inpatient Hospital | Payer: Medicaid Other | Attending: Emergency Medicine | Admitting: Emergency Medicine

## 2021-11-11 ENCOUNTER — Emergency Department (HOSPITAL_COMMUNITY): Payer: Medicaid Other

## 2021-11-11 DIAGNOSIS — Z91010 Allergy to peanuts: Secondary | ICD-10-CM | POA: Diagnosis not present

## 2021-11-11 DIAGNOSIS — F19959 Other psychoactive substance use, unspecified with psychoactive substance-induced psychotic disorder, unspecified: Secondary | ICD-10-CM | POA: Insufficient documentation

## 2021-11-11 DIAGNOSIS — F319 Bipolar disorder, unspecified: Secondary | ICD-10-CM | POA: Diagnosis not present

## 2021-11-11 DIAGNOSIS — T148XXA Other injury of unspecified body region, initial encounter: Secondary | ICD-10-CM

## 2021-11-11 DIAGNOSIS — F1410 Cocaine abuse, uncomplicated: Secondary | ICD-10-CM | POA: Diagnosis not present

## 2021-11-11 DIAGNOSIS — F1910 Other psychoactive substance abuse, uncomplicated: Secondary | ICD-10-CM

## 2021-11-11 DIAGNOSIS — R45851 Suicidal ideations: Secondary | ICD-10-CM | POA: Diagnosis not present

## 2021-11-11 DIAGNOSIS — F10120 Alcohol abuse with intoxication, uncomplicated: Secondary | ICD-10-CM | POA: Diagnosis not present

## 2021-11-11 DIAGNOSIS — S61051A Open bite of right thumb without damage to nail, initial encounter: Secondary | ICD-10-CM | POA: Insufficient documentation

## 2021-11-11 DIAGNOSIS — S6991XA Unspecified injury of right wrist, hand and finger(s), initial encounter: Secondary | ICD-10-CM | POA: Diagnosis present

## 2021-11-11 DIAGNOSIS — Z79899 Other long term (current) drug therapy: Secondary | ICD-10-CM | POA: Diagnosis not present

## 2021-11-11 DIAGNOSIS — M79644 Pain in right finger(s): Secondary | ICD-10-CM | POA: Insufficient documentation

## 2021-11-11 DIAGNOSIS — F10920 Alcohol use, unspecified with intoxication, uncomplicated: Secondary | ICD-10-CM

## 2021-11-11 DIAGNOSIS — Z20822 Contact with and (suspected) exposure to covid-19: Secondary | ICD-10-CM | POA: Insufficient documentation

## 2021-11-11 DIAGNOSIS — W503XXA Accidental bite by another person, initial encounter: Secondary | ICD-10-CM | POA: Insufficient documentation

## 2021-11-11 LAB — COMPREHENSIVE METABOLIC PANEL
ALT: 20 U/L (ref 0–44)
AST: 25 U/L (ref 15–41)
Albumin: 4 g/dL (ref 3.5–5.0)
Alkaline Phosphatase: 63 U/L (ref 38–126)
Anion gap: 10 (ref 5–15)
BUN: 8 mg/dL (ref 6–20)
CO2: 19 mmol/L — ABNORMAL LOW (ref 22–32)
Calcium: 8.8 mg/dL — ABNORMAL LOW (ref 8.9–10.3)
Chloride: 113 mmol/L — ABNORMAL HIGH (ref 98–111)
Creatinine, Ser: 0.92 mg/dL (ref 0.44–1.00)
GFR, Estimated: >60 mL/min (ref >60)
Glucose, Bld: 89 mg/dL (ref 70–99)
Potassium: 3.6 mmol/L (ref 3.5–5.1)
Sodium: 142 mmol/L (ref 135–145)
Total Bilirubin: 0.6 mg/dL (ref 0.3–1.2)
Total Protein: 6.5 g/dL (ref 6.5–8.1)

## 2021-11-11 LAB — RAPID URINE DRUG SCREEN, HOSP PERFORMED
Amphetamines: NOT DETECTED
Barbiturates: NOT DETECTED
Benzodiazepines: NOT DETECTED
Cocaine: POSITIVE — AB
Opiates: NOT DETECTED
Tetrahydrocannabinol: NOT DETECTED

## 2021-11-11 LAB — CBC
HCT: 38.5 % (ref 36.0–46.0)
Hemoglobin: 13.2 g/dL (ref 12.0–15.0)
MCH: 32.3 pg (ref 26.0–34.0)
MCHC: 34.3 g/dL (ref 30.0–36.0)
MCV: 94.1 fL (ref 80.0–100.0)
Platelets: 303 10*3/uL (ref 150–400)
RBC: 4.09 MIL/uL (ref 3.87–5.11)
RDW: 12.7 % (ref 11.5–15.5)
WBC: 7.7 10*3/uL (ref 4.0–10.5)
nRBC: 0 % (ref 0.0–0.2)

## 2021-11-11 LAB — HCG, QUANTITATIVE, PREGNANCY: hCG, Beta Chain, Quant, S: 1 m[IU]/mL (ref <5)

## 2021-11-11 LAB — ETHANOL: Alcohol, Ethyl (B): 283 mg/dL — ABNORMAL HIGH (ref <10)

## 2021-11-11 LAB — SALICYLATE LEVEL: Salicylate Lvl: <7 mg/dL — ABNORMAL LOW (ref 7.0–30.0)

## 2021-11-11 LAB — ACETAMINOPHEN LEVEL: Acetaminophen (Tylenol), Serum: <10 ug/mL — ABNORMAL LOW (ref 10–30)

## 2021-11-11 MED ORDER — AMOXICILLIN-POT CLAVULANATE 875-125 MG PO TABS
1.0000 | ORAL_TABLET | Freq: Two times a day (BID) | ORAL | Status: DC
  Administered 2021-11-12 (×2): 1 via ORAL
  Filled 2021-11-11 (×2): qty 1

## 2021-11-11 MED ORDER — LORAZEPAM 2 MG/ML IJ SOLN
2.0000 mg | Freq: Once | INTRAMUSCULAR | Status: AC
Start: 2021-11-11 — End: 2021-11-11
  Administered 2021-11-11: 2 mg via INTRAMUSCULAR
  Filled 2021-11-11: qty 1

## 2021-11-11 MED ORDER — IBUPROFEN 400 MG PO TABS: 400.0000 mg | ORAL_TABLET | Freq: Once | ORAL | Status: DC | PRN

## 2021-11-11 NOTE — ED Notes (Signed)
Pt uncooperative in triage, stated she did not want to be here and walked out
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
## 2021-11-12 ENCOUNTER — Other Ambulatory Visit (HOSPITAL_COMMUNITY): Payer: Self-pay

## 2021-11-12 ENCOUNTER — Other Ambulatory Visit (HOSPITAL_COMMUNITY)
Admission: EM | Admit: 2021-11-12 | Discharge: 2021-11-15 | Disposition: A | Discharge status: Home / Self Care | Payer: Medicaid Other | Source: Home / Self Care | Admitting: Student

## 2021-11-12 DIAGNOSIS — F314 Bipolar disorder, current episode depressed, severe, without psychotic features: Secondary | ICD-10-CM

## 2021-11-12 DIAGNOSIS — F19959 Other psychoactive substance use, unspecified with psychoactive substance-induced psychotic disorder, unspecified: Secondary | ICD-10-CM | POA: Insufficient documentation

## 2021-11-12 DIAGNOSIS — F1410 Cocaine abuse, uncomplicated: Secondary | ICD-10-CM

## 2021-11-12 DIAGNOSIS — F1910 Other psychoactive substance abuse, uncomplicated: Secondary | ICD-10-CM

## 2021-11-12 DIAGNOSIS — F1020 Alcohol dependence, uncomplicated: Secondary | ICD-10-CM

## 2021-11-12 DIAGNOSIS — Z79899 Other long term (current) drug therapy: Secondary | ICD-10-CM | POA: Insufficient documentation

## 2021-11-12 DIAGNOSIS — F1994 Other psychoactive substance use, unspecified with psychoactive substance-induced mood disorder: Secondary | ICD-10-CM | POA: Diagnosis present

## 2021-11-12 DIAGNOSIS — G2401 Drug induced subacute dyskinesia: Secondary | ICD-10-CM

## 2021-11-12 LAB — RESP PANEL BY RT-PCR (FLU A&B, COVID) ARPGX2
Influenza A by PCR: NEGATIVE
Influenza B by PCR: NEGATIVE
SARS Coronavirus 2 by RT PCR: NEGATIVE

## 2021-11-12 MED ORDER — DICYCLOMINE HCL 20 MG PO TABS
20.0000 mg | ORAL_TABLET | Freq: Four times a day (QID) | ORAL | Status: DC | PRN
Start: 2021-11-12 — End: 2021-11-15
  Administered 2021-11-13: 20 mg via ORAL
  Filled 2021-11-12: qty 10
  Filled 2021-11-12: qty 1

## 2021-11-12 MED ORDER — VENLAFAXINE HCL ER 75 MG PO CP24
75.0000 mg | ORAL_CAPSULE | Freq: Every day | ORAL | Status: DC
  Administered 2021-11-13 – 2021-11-15 (×3): 75 mg via ORAL
  Filled 2021-11-12 (×3): qty 1
  Filled 2021-11-12: qty 14

## 2021-11-12 MED ORDER — RISPERIDONE 0.5 MG PO TABS
0.5000 mg | ORAL_TABLET | Freq: Every day | ORAL | Status: DC
  Administered 2021-11-12 – 2021-11-14 (×3): 0.5 mg via ORAL
  Filled 2021-11-12: qty 1
  Filled 2021-11-12: qty 14
  Filled 2021-11-12 (×2): qty 1

## 2021-11-12 MED ORDER — ONDANSETRON 4 MG PO TBDP
4.0000 mg | ORAL_TABLET | Freq: Three times a day (TID) | ORAL | Status: DC | PRN
Start: 2021-11-12 — End: 2021-11-15
  Administered 2021-11-12 – 2021-11-13 (×2): 4 mg via ORAL
  Filled 2021-11-12: qty 10
  Filled 2021-11-12 (×3): qty 1

## 2021-11-12 MED ORDER — AMOXICILLIN-POT CLAVULANATE 875-125 MG PO TABS
1.0000 | ORAL_TABLET | Freq: Two times a day (BID) | ORAL | Status: DC
  Administered 2021-11-12 – 2021-11-15 (×6): 1 via ORAL
  Filled 2021-11-12 (×4): qty 1
  Filled 2021-11-12: qty 6
  Filled 2021-11-12 (×2): qty 1

## 2021-11-12 MED ORDER — ONDANSETRON 4 MG PO TBDP
ORAL_TABLET | ORAL | Status: AC
  Administered 2021-11-12: 4 mg via ORAL
  Filled 2021-11-12: qty 1

## 2021-11-12 MED ORDER — ACETAMINOPHEN 325 MG PO TABS
650.0000 mg | ORAL_TABLET | Freq: Once | ORAL | Status: AC
  Administered 2021-11-12: 650 mg via ORAL
  Filled 2021-11-12: qty 2

## 2021-11-12 MED ORDER — ONDANSETRON 4 MG PO TBDP
4.0000 mg | ORAL_TABLET | Freq: Once | ORAL | Status: AC
  Administered 2021-11-12: 4 mg via ORAL

## 2021-11-12 MED ORDER — ALBUTEROL SULFATE HFA 108 (90 BASE) MCG/ACT IN AERS
1.0000 | INHALATION_SPRAY | Freq: Four times a day (QID) | RESPIRATORY_TRACT | Status: DC | PRN
  Filled 2021-11-12: qty 6.7

## 2021-11-12 MED ORDER — AMOXICILLIN-POT CLAVULANATE 875-125 MG PO TABS
1.0000 | ORAL_TABLET | Freq: Two times a day (BID) | ORAL | 0 refills | Status: DC
  Filled 2021-11-12: qty 12, 6d supply, fill #0

## 2021-11-12 MED ORDER — RISPERIDONE 0.5 MG PO TBDP: 0.5000 mg | ORAL_TABLET | Freq: Every day | ORAL | Status: DC

## 2021-11-12 MED ORDER — ACETAMINOPHEN 325 MG PO TABS: 650.0000 mg | ORAL_TABLET | Freq: Once | ORAL | Status: AC

## 2021-11-12 MED ORDER — ALUM & MAG HYDROXIDE-SIMETH 200-200-20 MG/5ML PO SUSP: 30.0000 mL | ORAL | Status: DC | PRN

## 2021-11-12 MED ORDER — VALBENAZINE TOSYLATE 60 MG PO CAPS
60.0000 mg | ORAL_CAPSULE | Freq: Every day | ORAL | Status: DC
  Filled 2021-11-12: qty 1

## 2021-11-12 MED ORDER — ACETAMINOPHEN 325 MG PO TABS
ORAL_TABLET | ORAL | Status: AC
  Administered 2021-11-12: 650 mg via ORAL
  Filled 2021-11-12: qty 2

## 2021-11-12 MED ORDER — VALBENAZINE TOSYLATE 60 MG PO CAPS
60.0000 mg | ORAL_CAPSULE | Freq: Every day | ORAL | Status: DC
  Administered 2021-11-13 – 2021-11-15 (×3): 60 mg via ORAL
  Filled 2021-11-12 (×2): qty 1
  Filled 2021-11-12: qty 14
  Filled 2021-11-12: qty 1

## 2021-11-12 MED ORDER — ONDANSETRON HCL 4 MG PO TABS
4.0000 mg | ORAL_TABLET | Freq: Four times a day (QID) | ORAL | 0 refills | Status: DC
  Filled 2021-11-12: qty 12, 3d supply, fill #0

## 2021-11-12 MED ORDER — MAGNESIUM HYDROXIDE 400 MG/5ML PO SUSP: 30.0000 mL | Freq: Every day | ORAL | Status: DC | PRN

## 2021-11-12 MED ORDER — VENLAFAXINE HCL ER 75 MG PO CP24
75.0000 mg | ORAL_CAPSULE | Freq: Every day | ORAL | Status: DC
  Filled 2021-11-12: qty 1

## 2021-11-12 MED ORDER — ONDANSETRON 4 MG PO TBDP
ORAL_TABLET | ORAL | Status: AC
  Filled 2021-11-12: qty 1

## 2021-11-12 MED ORDER — IBUPROFEN 400 MG PO TABS
400.0000 mg | ORAL_TABLET | Freq: Once | ORAL | Status: AC
  Administered 2021-11-12: 400 mg via ORAL
  Filled 2021-11-12: qty 1

## 2021-11-12 MED ORDER — ONDANSETRON 4 MG PO TBDP: 4.0000 mg | ORAL_TABLET | Freq: Once | ORAL | Status: AC

## 2021-11-12 MED ORDER — HYDROXYZINE HCL 25 MG PO TABS
25.0000 mg | ORAL_TABLET | Freq: Three times a day (TID) | ORAL | Status: DC | PRN
  Administered 2021-11-13: 25 mg via ORAL
  Filled 2021-11-12: qty 1
  Filled 2021-11-12: qty 10

## 2021-11-12 MED ORDER — VENLAFAXINE HCL ER 75 MG PO CP24
75.0000 mg | ORAL_CAPSULE | Freq: Every day | ORAL | Status: DC
  Administered 2021-11-12: 75 mg via ORAL
  Filled 2021-11-12: qty 1

## 2021-11-12 MED ORDER — AMOXICILLIN-POT CLAVULANATE 875-125 MG PO TABS: 1.0000 | ORAL_TABLET | Freq: Two times a day (BID) | ORAL | Status: DC

## 2021-11-12 NOTE — ED Notes (Signed)
Darlene Gates sisters [phone] requesting to speak to the patient
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
## 2021-11-12 NOTE — BH Assessment (Signed)
TTS clinician attempted to complete TTS assessment, however BAL is 283. Steward Drone, RN, per note, patient appears intoxicated. TTS will attempt assessment once patient is sober.
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
## 2021-11-12 NOTE — ED Notes (Signed)
Pt reported she is having a headache. Pt stated pain level is 9 on a scale of 0-10.

## 2021-11-12 NOTE — ED Notes (Signed)
C/o nausea and pain. 

## 2021-11-12 NOTE — ED Notes (Signed)
Pt refuses to have her finger immobilize

## 2021-11-12 NOTE — ED Notes (Signed)
Pt to return to [facility] pharmacy when d/c'd from [facility] to pick up prescribed meds Augmentin and zofran. TOC and [facility] notified.

## 2021-11-12 NOTE — ED Notes (Signed)
Pt's belongings bag (3) in locker 3 in purple zone, IVC paperwork and purple folder at the secretary desk on clip bored.

## 2021-11-12 NOTE — Consult Note (Signed)
[facility] ED ASSESSMENT   Reason for Consult:  SI Referring Physician:  Allyne Gee, PA-C Patient Identification: Darlene Gates MRN:  [id number] ED Chief Complaint: Substance abuse [facility])  Diagnosis:  Principal Problem:   Substance abuse (HCC) Active Problems:   Bipolar I disorder [facility])   ED Assessment Time Calculation: Start Time: 0930 Stop Time: 1000 Total Time in Minutes (Assessment Completion): 30   Subjective:   Darlene Gates is a 35 y.o. female patient who voluntarily presented to [facility] ED stating she needs help with detox from drugs and alcohol, and is currently having suicidal ideations.  Patient reported having a history of cutting and is considering doing this again.  She recently had a miscarriage in July.  HPI:   Patient seen this morning in her room at [facility] ED for face-to-face evaluation.  She complains of nausea and tremors, she reports not feeling well this morning.  She tells me she has been using alcohol and crack cocaine.  She reports the alcohol use is daily, she would not specify exactly how much she drinks per day.  She does report the crack cocaine is not daily but is multiple times per week as of recently.  She denies current suicidal or homicidal ideations.  She denies any plans or intent.  She does endorse history of self-harm, but states she has not done that in years.  She denies any auditory or visual hallucinations.  She does endorse auditory hallucinations while she is intoxicated.  Patient is hoping to receive further treatment for detox.  She reports desire to become sober and remain sober.  She does endorse recent stressors such as financial concerns and a recent miscarriage. She follows up with [facility] for OP and med management. She reports being compliant with oral medications, she is unable to tell me the last time she received Abilify M. She reports current med regimen of Effexor and Risperidone. She is agreeable to restart these medications. She endorses poor  sleep but feels like it is due to her crack cocaine use. No problems with appetite. She states her main goal of this ED hospitalization is to detox and receive further substance abuse treatment.  Patient is difficult to engage in conversation due to her not feeling well.  She does not appear psychotic, is not responding to internal stimuli.  Speech is normal in rate and tone.  Thought content appears logical. Pt is agreeable to voluntary transfer to [facility]. Pt accepted by Dr. Hazle Quant, and can transfer after noon. EDP and RN have been notified of disposition/transfer.   Past Psychiatric History:  Reported history of bipolar disorder, depression, anxiety, cluster B personality disorder, PTSD, and polysubstance abuse.  Risk to Self or Others: Is the patient at risk to self? No Has the patient been a risk to self in the past 6 months? No Has the patient been a risk to self within the distant past? No Is the patient a risk to others? No Has the patient been a risk to others in the past 6 months? No Has the patient been a risk to others within the distant past? No  [place] Scale:  Flowsheet Row ED from 11/11/2021 in [facility] EMERGENCY DEPARTMENT ED from 10/10/2021 in [facility] EMERGENCY DEPARTMENT Office Visit from 05/21/2021 in [facility]  C-SSRS RISK CATEGORY High Risk No Risk Error: Q6 is Yes, you must answer 7       Past Medical History:  Past  Medical History:  Diagnosis Date   Anxiety    Bipolar 1 disorder (HCC)    Depression    History of alcohol abuse    History of substance abuse (HCC)    History reviewed. No pertinent surgical history. Family History:  Family History  Problem Relation Age of Onset   Alcohol abuse Mother    Bipolar disorder Mother    Depression Mother    Alcohol abuse Father    Hyperlipidemia Father    Hypertension Father    Alcohol abuse Maternal Grandmother    Bipolar  disorder Maternal Grandmother    Alcohol abuse Maternal Grandfather    Social History:  Social History   Substance and Sexual Activity  Alcohol Use Yes   Alcohol/week: 4.0 standard drinks of alcohol   Types: 3 Cans of beer, 1 Shots of liquor per week   Comment: pt reports she drincks daily     Social History   Substance and Sexual Activity  Drug Use Not on file   Comment: pt reports she uses daily    Social History   Socioeconomic History   Marital status: Married    Spouse name: Not on file   Number of children: Not on file   Years of education: Not on file   Highest education level: Not on file  Occupational History   Not on file  Tobacco Use   Smoking status: Every Day    Packs/day: 3.00    Types: Cigarettes   Smokeless tobacco: Never  Vaping Use   Vaping Use: Never used  Substance and Sexual Activity   Alcohol use: Yes    Alcohol/week: 4.0 standard drinks of alcohol    Types: 3 Cans of beer, 1 Shots of liquor per week    Comment: pt reports she drincks daily   Drug use: Not on file    Comment: pt reports she uses daily   Sexual activity: Yes    Partners: Male    Birth control/protection: Injection, Condom  Other Topics Concern   Not on file  Social History Narrative   Not on file   Social Determinants of Health   Financial Resource Strain: Not on file  Food Insecurity: Not on file  Transportation Needs: Not on file  Physical Activity: Not on file  Stress: Not on file  Social Connections: Not on file   Allergies:   Allergies  Allergen Reactions   Bee Pollen Hives   Chlorpromazine Anaphylaxis   Other Swelling   Oxycodone-Acetaminophen Hives   Peanut Oil Anaphylaxis and Hives   Pineapple Swelling   Promethazine Anaphylaxis and Nausea And Vomiting    Labs:  Results for orders placed or performed during the hospital encounter of 11/11/21 (from the past 48 hour(s))  Rapid urine drug screen (hospital performed)     Status: Abnormal   Collection  Time: 11/11/21 10:12 PM  Result Value Ref Range   Opiates NONE DETECTED NONE DETECTED   Cocaine POSITIVE (A) NONE DETECTED   Benzodiazepines NONE DETECTED NONE DETECTED   Amphetamines NONE DETECTED NONE DETECTED   Tetrahydrocannabinol NONE DETECTED NONE DETECTED   Barbiturates NONE DETECTED NONE DETECTED    Comment: (NOTE) DRUG SCREEN FOR MEDICAL PURPOSES ONLY.  IF CONFIRMATION IS NEEDED FOR ANY PURPOSE, NOTIFY LAB WITHIN 5 DAYS.  LOWEST DETECTABLE LIMITS FOR URINE DRUG SCREEN Drug Class                     Cutoff (ng/mL) Amphetamine and metabolites  1000 Barbiturate and metabolites    200 Benzodiazepine                 200 Tricyclics and metabolites     300 Opiates and metabolites        300 Cocaine and metabolites        300 THC                            50 Performed at [facility] Lab, [address] [place], [place] [address]   Comprehensive metabolic panel     Status: Abnormal   Collection Time: 11/11/21 10:47 PM  Result Value Ref Range   Sodium 142 135 - 145 mmol/L   Potassium 3.6 3.5 - 5.1 mmol/L   Chloride 113 (H) 98 - 111 mmol/L   CO2 19 (L) 22 - 32 mmol/L   Glucose, Bld 89 70 - 99 mg/dL    Comment: Glucose reference range applies only to samples taken after fasting for at least 8 hours.   BUN 8 6 - 20 mg/dL   Creatinine, Ser 9.83 0.44 - 1.00 mg/dL   Calcium 8.8 (L) 8.9 - 10.3 mg/dL   Total Protein 6.5 6.5 - 8.1 g/dL   Albumin 4.0 3.5 - 5.0 g/dL   AST 25 15 - 41 U/L   ALT 20 0 - 44 U/L   Alkaline Phosphatase 63 38 - 126 U/L   Total Bilirubin 0.6 0.3 - 1.2 mg/dL   GFR, Estimated >38 >25 mL/min    Comment: (NOTE) Calculated using the CKD-EPI Creatinine Equation (2021)    Anion gap 10 5 - 15    Comment: Performed at [facility] Lab, [address] [place], [place] [address]  Ethanol     Status: Abnormal   Collection Time: 11/11/21 10:47 PM  Result Value Ref Range   Alcohol, Ethyl (B) 283 (H) <10 mg/dL    Comment: (NOTE) Lowest detectable limit  for serum alcohol is 10 mg/dL.  For medical purposes only. Performed at [facility] Lab, [address], [place], [place] [address]   Salicylate level     Status: Abnormal   Collection Time: 11/11/21 10:47 PM  Result Value Ref Range   Salicylate Lvl <7.0 (L) 7.0 - 30.0 mg/dL    Comment: Performed at [facility], [address], [place], [place] [address]  Acetaminophen level     Status: Abnormal   Collection Time: 11/11/21 10:47 PM  Result Value Ref Range   Acetaminophen (Tylenol), Serum <10 (L) 10 - 30 ug/mL    Comment: (NOTE) Therapeutic concentrations vary significantly. A range of 10-30 ug/mL  may be an effective concentration for many patients. However, some  are best treated at concentrations outside of this range. Acetaminophen concentrations >150 ug/mL at 4 hours after ingestion  and >50 ug/mL at 12 hours after ingestion are often associated with  toxic reactions.  Performed at [facility] Lab, [address], [place], [place] [address]   cbc     Status: None   Collection Time: 11/11/21 10:47 PM  Result Value Ref Range   WBC 7.7 4.0 - 10.5 K/uL   RBC 4.09 3.87 - 5.11 MIL/uL   Hemoglobin 13.2 12.0 - 15.0 g/dL   HCT 35.3 29.9 - 24.2 %   MCV 94.1 80.0 - 100.0 fL   MCH 32.3 26.0 - 34.0 pg   MCHC 34.3 30.0 - 36.0 g/dL  RDW 12.7 11.5 - 15.5 %   Platelets 303 150 - 400 K/uL   nRBC 0.0 0.0 - 0.2 %    Comment: Performed at  [facility], [address] [place], [place] [address]  hCG, quantitative, pregnancy     Status: None   Collection Time: 11/11/21 10:47 PM  Result Value Ref Range   hCG, Beta Chain, Quant, S 1 <5 mIU/mL    Comment:          GEST. AGE      CONC.  (mIU/mL)   <=1 WEEK        5 - 50     2 WEEKS       50 - 500     3 WEEKS       100 - 10,000     4 WEEKS     1,000 - 30,000     5 WEEKS     3,500 - 115,000   6-8 WEEKS     12,000 - 270,000    12 WEEKS     15,000 - 220,000        FEMALE AND NON-PREGNANT FEMALE:     LESS THAN 5  mIU/mL Performed at [facility] Lab, [address], [place], [place] [address]   Resp Panel by RT-PCR (Flu A&B, Covid) Anterior Nasal Swab     Status: None   Collection Time: 11/12/21  4:30 AM   Specimen: Anterior Nasal Swab  Result Value Ref Range   SARS Coronavirus 2 by RT PCR NEGATIVE NEGATIVE    Comment: (NOTE) SARS-CoV-2 target nucleic acids are NOT DETECTED.  The SARS-CoV-2 RNA is generally detectable in upper respiratory specimens during the acute phase of infection. The lowest concentration of SARS-CoV-2 viral copies this assay can detect is 138 copies/mL. A negative result does not preclude SARS-Cov-2 infection and should not be used as the sole basis for treatment or other patient management decisions. A negative result may occur with  improper specimen collection/handling, submission of specimen other than nasopharyngeal swab, presence of viral mutation(s) within the areas targeted by this assay, and inadequate number of viral copies(<138 copies/mL). A negative result must be combined with clinical observations, patient history, and epidemiological information. The expected result is Negative.  Fact Sheet for Patients:  BloggerCourse.com  Fact Sheet for Healthcare Providers:  SeriousBroker.it  This test is no t yet approved or cleared by the [place] FDA and  has been authorized for detection and/or diagnosis of SARS-CoV-2 by FDA under an Emergency Use Authorization (EUA). This EUA will remain  in effect (meaning this test can be used) for the duration of the COVID-19 declaration under Section 564(b)(1) of the Act, 21 U.S.C.section 360bbb-3(b)(1), unless the authorization is terminated  or revoked sooner.       Influenza A by PCR NEGATIVE NEGATIVE   Influenza B by PCR NEGATIVE NEGATIVE    Comment: (NOTE) The Xpert Xpress SARS-CoV-2/FLU/RSV plus assay is intended as an aid in the diagnosis of influenza  from Nasopharyngeal swab specimens and should not be used as a sole basis for treatment. Nasal washings and aspirates are unacceptable for Xpert Xpress SARS-CoV-2/FLU/RSV testing.  Fact Sheet for Patients: BloggerCourse.com  Fact Sheet for Healthcare Providers: SeriousBroker.it  This test is not yet approved or cleared by the [place] FDA and has been authorized for detection and/or diagnosis of SARS-CoV-2 by FDA under an Emergency Use Authorization (EUA). This EUA will remain in effect (meaning this test can be used)  for the duration of the COVID-19 declaration under Section 564(b)(1) of the Act, 21 U.S.C. section 360bbb-3(b)(1), unless the authorization is terminated or revoked.  Performed at [facility], [address] [place], [place] [id number]     Current Facility-Administered Medications  Medication Dose Route Frequency Provider Last Rate Last Admin   amoxicillin-clavulanate (AUGMENTIN) 875-125 MG per tablet 1 tablet  1 tablet Oral Q12H Garlon HatchetSanders, Lisa M, PA-C   1 tablet at 11/12/21 1008   risperiDONE (RISPERDAL M-TABS) disintegrating tablet 0.5 mg  0.5 mg Oral QHS Eligha Bridegroomoleman, Avary Eichenberger, NP       venlafaxine XR (EFFEXOR-XR) 24 hr capsule 75 mg  75 mg Oral Daily Eligha Bridegroomoleman, Emrick Hensch, NP   75 mg at 11/12/21 1009   Current Outpatient Medications  Medication Sig Dispense Refill   acetaminophen-codeine (TYLENOL #3) 300-30 MG tablet Take 1 tablet by mouth every 6 (six) hours as needed for moderate pain. 10 tablet 0   albuterol (VENTOLIN HFA) 108 (90 Base) MCG/ACT inhaler Inhale 1-2 puffs into the lungs every 6 (six) hours as needed for wheezing or shortness of breath. 6.7 g 0   benztropine (COGENTIN) 0.5 MG tablet TAKE 1 TABLET(0.5 MG) BY MOUTH THREE TIMES DAILY 270 tablet 1   dicyclomine (BENTYL) 20 MG tablet Take 20 mg by mouth every 6 (six) hours as needed for cramping. IBS     ondansetron (ZOFRAN-ODT) 4 MG disintegrating  tablet Take 1 tablet (4 mg total) by mouth every 8 (eight) hours as needed for nausea or vomiting. 20 tablet 0   predniSONE (DELTASONE) 20 MG tablet Take 2 tablets (40 mg total) by mouth daily. 10 tablet 0   risperiDONE (RISPERDAL) 0.5 MG tablet Take 1 tablet (0.5 mg total) by mouth at bedtime. 30 tablet 3   valACYclovir (VALTREX) 500 MG tablet Take 1 tablet (500 mg total) by mouth 2 (two) times daily. 14 tablet 0   valbenazine (INGREZZA) 60 MG capsule Take 1 capsule (60 mg total) by mouth daily. 30 capsule 30   venlafaxine XR (EFFEXOR XR) 75 MG 24 hr capsule Take 1 capsule (75 mg total) by mouth daily. 30 capsule 3   VIVITROL 380 MG SUSR 380 mg by Subdermal route every 28 (twenty-eight) days.      Psychiatric Specialty Exam: Presentation  General Appearance: Appropriate for Environment  Eye Contact:Fair  Speech:Clear and Coherent  Speech Volume:Normal  Handedness:Right   Mood and Affect  Mood:Depressed; Anxious  Affect:Congruent   Thought Process  Thought Processes:Coherent  Descriptions of Associations:Intact  Orientation:Full (Time, Place and Person)  Thought Content:Logical  History of Schizophrenia/Schizoaffective disorder:Yes  Duration of Psychotic Symptoms:Greater than six months  Hallucinations:Hallucinations: None  Ideas of Reference:None  Suicidal Thoughts:Suicidal Thoughts: No  Homicidal Thoughts:Homicidal Thoughts: No   Sensorium  Memory:Immediate Good  Judgment:Fair  Insight:Fair   Executive Functions  Concentration:Fair  Attention Span:Fair  Recall:Good  Fund of Knowledge:Good  Language:Good   Psychomotor Activity  Psychomotor Activity:Psychomotor Activity: Normal   Assets  Assets:Communication Skills; Desire for Improvement; Physical Health; Resilience    Sleep  Sleep:Sleep: Fair   Physical Exam: Physical Exam Neurological:     Mental Status: She is alert and oriented to person, place, and time.  Psychiatric:         Mood and Affect: Mood is anxious and depressed.        Behavior: Behavior is cooperative.        Thought Content: Thought content normal.    Review of Systems  Psychiatric/Behavioral:  Positive for  depression and substance abuse. The patient has insomnia.   All other systems reviewed and are negative.  Blood pressure 99/64, pulse 77, temperature 99.2 F (37.3 C), temperature source Oral, resp. rate 18, SpO2 98 %, unknown if currently breastfeeding. There is no height or weight on file to calculate BMI.  Medical Decision Making: Patient case reviewed and discussed with Dr. Lucianne Muss.  Patient does meet criteria for [facility]BC.  We will plan on transferring after noon. EDP and RN have been notified of disposition/transfer.   Problem 1: Depression Home medications of Effexor and Risperidone restarted  Problem 2: Substance abuse Will transfer to [facility] for further detox and substance abuse tx   Disposition:  Transfer to [facility] at [facility], NP 11/12/2021 10:24 AM
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
## 2021-11-12 NOTE — ED Notes (Signed)
Pt arrived [facility]BC 

## 2021-11-12 NOTE — ED Notes (Signed)
Release from IVC in process, pending voluntary transport to [facility].

## 2021-11-12 NOTE — ED Notes (Addendum)
Psych NP at [facility], pt verbalized agreeable to [facility]/ [facility] plan after 1200 noon.

## 2021-11-12 NOTE — ED Notes (Signed)
Pt is in the ned sleeping. Respirations are even and unlabored. No acute distress noted. Will continue monitor for safety.

## 2021-11-12 NOTE — ED Notes (Addendum)
Given saltines and gingerale for nausea per request, Safe transport contacted.

## 2021-11-12 NOTE — Progress Notes (Signed)
Pt's CIWA was 7.

## 2021-11-12 NOTE — ED Notes (Signed)
Pt to Hall-12 at this time from [facility] lobby

## 2021-11-12 NOTE — ED Notes (Signed)
Pt appears to be sleeping, even RR and unlabored, NAD noted, sitter not avaible, pt is within view of staff in hall, area in hall secured, care on going, will continue to monitor.

## 2021-11-12 NOTE — BH Assessment (Signed)
Per Dr. Kumar in the 9am morning huddle/bed meeting, Provider to assess. No TTS CCA is needed at this time. 

## 2021-11-13 LAB — LIPID PANEL
Cholesterol: 155 mg/dL (ref 0–200)
HDL: 67 mg/dL (ref >40)
LDL Cholesterol: 73 mg/dL (ref 0–99)
Total CHOL/HDL Ratio: 2.3 RATIO
Triglycerides: 76 mg/dL (ref <150)
VLDL: 15 mg/dL (ref 0–40)

## 2021-11-13 LAB — TSH: TSH: 0.618 u[IU]/mL (ref 0.350–4.500)

## 2021-11-13 LAB — HEMOGLOBIN A1C
Hgb A1c MFr Bld: 4.8 % (ref 4.8–5.6)
Mean Plasma Glucose: 91.06 mg/dL

## 2021-11-13 MED ORDER — MELATONIN 3 MG PO TABS
3.0000 mg | ORAL_TABLET | Freq: Every day | ORAL | Status: DC
  Administered 2021-11-13 – 2021-11-14 (×2): 3 mg via ORAL
  Filled 2021-11-13 (×2): qty 1
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
## 2021-11-13 NOTE — Progress Notes (Signed)
Patient complained of nausea.  4mg  zofran given PO PRN now.  M.D. aware.  Patient resting in bed now.  Will continue to monitor.

## 2021-11-13 NOTE — Progress Notes (Signed)
Encouraged patient to attend a nursing group however she reported not feeling up to it.  Support given.

## 2021-11-13 NOTE — ED Notes (Signed)
Pt is in the bed sleeping. Respirations are even and unlabored. No acute distress noted. Will continue to monitor for safety. 

## 2021-11-13 NOTE — Progress Notes (Deleted)
Patient appears uncomfortable with hot/cold flashes and nausea.  Provider Danne Baxter made aware and 0.1mg  clonidine ordered and given x1 dose.  Ice chips given for nausea.  Will monitor.
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
## 2021-11-13 NOTE — Progress Notes (Signed)
Patient reports feeling better after receiving zofran.  She is now out of bed talking on the phone.

## 2021-11-13 NOTE — ED Notes (Signed)
Pt attending AA group meeting 

## 2021-11-13 NOTE — Progress Notes (Signed)
Patient complained of tremors and nausea.  Patient given zofran earlier.  Given vistaril and bentyl PO PRN now.  She has slight visible tremors, No vomiting or diahrea.  Will monitor and provide support.
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
## 2021-11-13 NOTE — ED Notes (Signed)
Patient complained of nausea and given zofran.  Awaiting effect.  Patient resting in bed at present time.  Will monitor.

## 2021-11-14 ENCOUNTER — Encounter (HOSPITAL_COMMUNITY): Payer: Self-pay

## 2021-11-14 ENCOUNTER — Encounter (HOSPITAL_COMMUNITY): Payer: Self-pay | Admitting: Student

## 2021-11-14 MED ORDER — ALBUTEROL SULFATE HFA 108 (90 BASE) MCG/ACT IN AERS: 1.0000 | INHALATION_SPRAY | Freq: Four times a day (QID) | RESPIRATORY_TRACT | 0 refills | Status: DC | PRN

## 2021-11-14 MED ORDER — ONDANSETRON 4 MG PO TBDP: 4.0000 mg | ORAL_TABLET | Freq: Three times a day (TID) | ORAL | 0 refills | Status: DC | PRN

## 2021-11-14 MED ORDER — AMOXICILLIN-POT CLAVULANATE 875-125 MG PO TABS: 1.0000 | ORAL_TABLET | Freq: Two times a day (BID) | ORAL | 0 refills | Status: AC

## 2021-11-14 MED ORDER — TRAZODONE HCL 50 MG PO TABS: 50.0000 mg | ORAL_TABLET | Freq: Every evening | ORAL | 0 refills | Status: DC | PRN

## 2021-11-14 MED ORDER — VENLAFAXINE HCL ER 75 MG PO CP24: 75.0000 mg | ORAL_CAPSULE | Freq: Every day | ORAL | 0 refills | Status: DC

## 2021-11-14 MED ORDER — HYDROXYZINE HCL 25 MG PO TABS: 25.0000 mg | ORAL_TABLET | Freq: Three times a day (TID) | ORAL | 0 refills | Status: DC | PRN

## 2021-11-14 MED ORDER — VALBENAZINE TOSYLATE 60 MG PO CAPS: 60.0000 mg | ORAL_CAPSULE | Freq: Every day | ORAL | 0 refills | Status: DC

## 2021-11-14 MED ORDER — RISPERIDONE 0.5 MG PO TABS: 0.5000 mg | ORAL_TABLET | Freq: Every day | ORAL | 0 refills | Status: DC

## 2021-11-14 MED ORDER — TRAZODONE HCL 50 MG PO TABS
50.0000 mg | ORAL_TABLET | Freq: Every evening | ORAL | Status: DC | PRN
  Filled 2021-11-14: qty 14

## 2021-11-14 NOTE — Medical Student Note (Signed)
[facility] URGENT CARE Provider Student Note For educational purposes for Medical, PA and NP students only and not part of the legal medical record.   CSN: [id number] Arrival date & time: 11/12/21  1441    History   Chief Complaint Darlene Gates is a 36 y.o. female with PMHx significant for bipolar 1 disorder, anxiety, depression, polysubstance abuse (cocaine, opiates, benzodiazapines, ecstasy, alcohol) who was admitted to [facility] for substance use detox and residential substance rehab treatment.  HPI On evaluation today, Darlene Gates states she is doing alright. She is still experiencing withdrawal symptoms that include shakes, dizziness, sweats, diarrhea, nausea, and headache. Denies seizure. She took melatonin last night but slept poorly, states that she normally has very poor sleep. She does state her appetite has improved a bit. She has had no issues with her medications. Denies SI, self-harm thoughts, HI, auditory/visual hallucinations. Denies mood symptoms. She does endorse depression, and anxiety about going home and the future. She would like to go to a rehab program after discharge.   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   Alcohol dependence (HCC) 01/15/2020   Opiate abuse, episodic (HCC) 01/15/2020   Homelessness 11/19/2019   Bipolar I disorder (HCC) 08/05/2019   Depression 08/05/2019   Generalized anxiety disorder 08/05/2019   History of  substance abuse (HCC) 08/05/2019   BMI 32.0-32.9,adult 06/16/2018   Generalized abdominal pain 06/16/2018   Hypokalemia 06/16/2018   Tobacco abuse 06/16/2018   Low back pain 04/15/2018   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HCC) 04/09/2018   Cocaine abuse, episodic use (HCC) 04/09/2018   Moderate alcohol use disorder (HCC) 04/09/2018   History of cesarean delivery 06/06/2017   Contraception management 06/06/2017   Positive GBS test 06/02/2017   Examination of participant in clinical trial 06/02/2017   Substance abuse (HCC) 04/04/2017   Vaginitis affecting pregnancy in second trimester, antepartum 02/25/2017   Cervical insufficiency during pregnancy, antepartum 12/20/2016   High-risk pregnancy supervision 12/19/2016   Previous child with congenital anomaly, currently pregnant, antepartum 12/19/2016   MDD (major depressive disorder), recurrent episode, 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  OB History     Gravida  4   Para  2   Term  1   Preterm  1   AB  1   Living  2      SAB      IAB      Ectopic      Multiple      Live Births               Home Medications    Prior to Admission medications   Medication Sig Start Date End Date Taking? Authorizing Provider  albuterol (VENTOLIN HFA) 108 (90 Base) MCG/ACT inhaler Inhale 1-2 puffs into the lungs every 6 (six) hours as needed for wheezing or shortness of breath. 01/21/20  Connye Burkitt, NP  amoxicillin-clavulanate (AUGMENTIN) 875-125 MG tablet Take 1 tablet by mouth every 12 (twelve) hours for 6 days. 11/12/21 11/18/21  Elgie Congo, MD  benztropine (COGENTIN) 0.5 MG tablet TAKE 1 TABLET(0.5 MG) BY MOUTH THREE TIMES DAILY 09/11/21   Eulis Canner E, NP  dicyclomine (BENTYL) 20 MG tablet Take 20 mg by mouth every 6 (six) hours as needed for cramping. IBS 11/03/20   [provider]  ondansetron (ZOFRAN) 4 MG tablet Take 1  tablet (4 mg total) by mouth every 6 (six) hours. 11/12/21   Elgie Congo, MD  ondansetron (ZOFRAN-ODT) 4 MG disintegrating tablet Take 1 tablet (4 mg total) by mouth every 8 (eight) hours as needed for nausea or vomiting. 11/07/21   Lennice Sites, DO  Prenatal Vit-Fe Fumarate-FA (PRENATAL MULTIVITAMIN) TABS tablet Take 1 tablet by mouth daily.    [provider]  risperiDONE (RISPERDAL) 0.5 MG tablet Take 1 tablet (0.5 mg total) by mouth at bedtime. 09/11/21   Salley Slaughter, NP  valACYclovir (VALTREX) 500 MG tablet Take 1 tablet (500 mg total) by mouth 2 (two) times daily. 02/16/20   Elsie Stain, MD  valbenazine [facility]) 60 MG capsule Take 1 capsule (60 mg total) by mouth daily. 09/11/21   Salley Slaughter, NP  venlafaxine XR (EFFEXOR XR) 75 MG 24 hr capsule Take 1 capsule (75 mg total) by mouth daily. 09/11/21   Salley Slaughter, NP  VIVITROL 380 MG SUSR 380 mg by Subdermal route every 28 (twenty-eight) days. 10/06/20   [provider]    Family History Family History  Problem Relation Age of Onset   Alcohol abuse Mother    Bipolar disorder Mother    Depression Mother    Alcohol abuse Father    Hyperlipidemia Father    Hypertension Father    Alcohol abuse Maternal Grandmother    Bipolar disorder Maternal Grandmother    Alcohol abuse Maternal Grandfather     Social History Social History   Tobacco Use   Smoking status: Every Day    Packs/day: 3.00    Types: Cigarettes   Smokeless tobacco: Never  Vaping Use   Vaping Use: Never used  Substance Use Topics   Alcohol use: Yes    Alcohol/week: 4.0 standard drinks of alcohol    Types: 3 Cans of beer, 1 Shots of liquor per week    Comment: pt reports she drincks daily     Allergies   Bee pollen, Chlorpromazine, Other, Oxycodone-acetaminophen, Peanut oil, Pineapple, and Promethazine   Review of Systems Review of Systems  Constitutional:  Positive for appetite change and diaphoresis.   Gastrointestinal:  Positive for diarrhea and nausea.  Neurological:  Positive for dizziness, tremors and headaches.  Psychiatric/Behavioral:  Positive for sleep disturbance. Negative for hallucinations, self-injury and suicidal ideas. The patient is nervous/anxious.      Physical Exam Updated Vital Signs BP 121/78 (BP Location: Right Arm)   Pulse 69   Temp 98.4 F (36.9 C) (Oral)   Resp 18   LMP  (LMP Unknown)   SpO2 100%   Physical Exam Constitutional:      Appearance: Normal appearance.  HENT:     Head: Normocephalic and atraumatic.  Eyes:     Extraocular Movements: Extraocular movements intact.  Pulmonary:     Effort: Pulmonary effort is normal.  Neurological:     Mental Status: She is alert and oriented to person, place, and time.  Psychiatric:  Attention and Perception: She does not perceive auditory or visual hallucinations.        Mood and Affect: Mood is depressed. Affect is flat.        Speech: Speech normal.        Behavior: Behavior is withdrawn. Behavior is cooperative.        Thought Content: Thought content normal. Thought content does not include homicidal or suicidal ideation.     ED Treatments / Results  Labs (all labs ordered are listed, but only abnormal results are displayed) Labs Reviewed  HEMOGLOBIN A1C  TSH  LIPID PANEL    EKG  Radiology No results found.  Procedures Procedures (including critical care time)  Medications Ordered in ED Medications  albuterol (VENTOLIN HFA) 108 (90 Base) MCG/ACT inhaler 1-2 puff (has no administration in time range)  dicyclomine (BENTYL) tablet 20 mg (20 mg Oral Given 11/13/21 1437)  ondansetron (ZOFRAN-ODT) disintegrating tablet 4 mg (4 mg Oral Given 11/13/21 0842)  risperiDONE (RISPERDAL) tablet 0.5 mg (0.5 mg Oral Given 11/13/21 2108)  alum & mag hydroxide-simeth (MAALOX/MYLANTA) 200-200-20 MG/5ML suspension 30 mL (has no administration in time range)  magnesium hydroxide (MILK OF MAGNESIA)  suspension 30 mL (has no administration in time range)  hydrOXYzine (ATARAX) tablet 25 mg (25 mg Oral Given 11/13/21 1437)  valbenazine (INGREZZA) capsule 60 mg (60 mg Oral Given 11/14/21 0925)  venlafaxine XR (EFFEXOR-XR) 24 hr capsule 75 mg (75 mg Oral Given 11/14/21 0927)  amoxicillin-clavulanate (AUGMENTIN) 875-125 MG per tablet 1 tablet (1 tablet Oral Given 11/14/21 0926)  melatonin tablet 3 mg (3 mg Oral Given 11/13/21 2107)  ibuprofen (ADVIL) tablet 400 mg (400 mg Oral Given 11/12/21 2242)     PLAN:  Cocaine abuse Sedative use disorder, in remission 2 to 3 weeks Alcohol use disorder Substance induced Psychosis Substance-induced mood disorder CIWA protocol for alcohol withdrawal Monitor for other substance withdrawal symptoms Continue Zofran 4mg  PRN for nausea Continue hydroxyzine 25mg  PRN for anxiety Continue home medications- Effexor 75mg  daily for mood, Risperdal 0.5mg  nightly for, Ingrezza 60mg  daily for Continue melatonin 3mg  for sleep Consider naltrexone when patient is no longer withdrawing from alcohol  Human Bite Continue Augmentin course (finish on 11/18/21)

## 2021-11-14 NOTE — ED Notes (Signed)
Patient A&Ox4. Denies intent to harm self/others when asked. Denies A/VH. Pt c/o nausea. Informed pt that she could have Zofran. Pt states, "I'll take it when I get off the phone". No acute distress noted. Routine safety checks conducted according to facility protocol. Encouraged patient to notify staff if thoughts of harm toward self or others arise. Patient verbalize understanding and agreement. Will continue to monitor for safety.

## 2021-11-14 NOTE — BH IP Treatment Plan (Signed)
Interdisciplinary Treatment and Diagnostic Plan Update  11/14/2021 Time of Session: 10:30AM  Darlene Gates MRN: [id number]  Diagnosis:  Final diagnoses:  Cocaine abuse (HCC)  Psychoactive substance-induced psychosis (HCC)  Alcohol use disorder, severe, in controlled environment (HCC)     Current Medications:  Current Facility-Administered Medications  Medication Dose Route Frequency Provider Last Rate Last Admin   albuterol (VENTOLIN HFA) 108 (90 Base) MCG/ACT inhaler 1-2 puff  1-2 puff Inhalation Q6H PRN Park Pope, MD       alum & mag hydroxide-simeth (MAALOX/MYLANTA) 200-200-20 MG/5ML suspension 30 mL  30 mL Oral Q4H PRN Park Pope, MD       amoxicillin-clavulanate (AUGMENTIN) 875-125 MG per tablet 1 tablet  1 tablet Oral BID Park Pope, MD   1 tablet at 11/13/21 2108   dicyclomine (BENTYL) tablet 20 mg  20 mg Oral Q6H PRN Park Pope, MD   20 mg at 11/13/21 1437   hydrOXYzine (ATARAX) tablet 25 mg  25 mg Oral TID PRN Park Pope, MD   25 mg at 11/13/21 1437   magnesium hydroxide (MILK OF MAGNESIA) suspension 30 mL  30 mL Oral Daily PRN Park Pope, MD       melatonin tablet 3 mg  3 mg Oral QHS Park Pope, MD   3 mg at 11/13/21 2107   ondansetron (ZOFRAN-ODT) disintegrating tablet 4 mg  4 mg Oral Q8H PRN Park Pope, MD   4 mg at 11/13/21 0842   risperiDONE (RISPERDAL) tablet 0.5 mg  0.5 mg Oral QHS Park Pope, MD   0.5 mg at 11/13/21 2108   valbenazine (INGREZZA) capsule 60 mg  60 mg Oral Daily Park Pope, MD   60 mg at 11/13/21 0953   venlafaxine XR (EFFEXOR-XR) 24 hr capsule 75 mg  75 mg Oral Q breakfast Park Pope, MD   75 mg at 11/13/21 4270   Current Outpatient Medications  Medication Sig Dispense Refill   albuterol (VENTOLIN HFA) 108 (90 Base) MCG/ACT inhaler Inhale 1-2 puffs into the lungs every 6 (six) hours as needed for wheezing or shortness of breath. 6.7 g 0   amoxicillin-clavulanate (AUGMENTIN) 875-125 MG tablet Take 1 tablet by mouth every 12 (twelve) hours for 6  days. 12 tablet 0   benztropine (COGENTIN) 0.5 MG tablet TAKE 1 TABLET(0.5 MG) BY MOUTH THREE TIMES DAILY 270 tablet 1   dicyclomine (BENTYL) 20 MG tablet Take 20 mg by mouth every 6 (six) hours as needed for cramping. IBS     ondansetron (ZOFRAN) 4 MG tablet Take 1 tablet (4 mg total) by mouth every 6 (six) hours. 12 tablet 0   ondansetron (ZOFRAN-ODT) 4 MG disintegrating tablet Take 1 tablet (4 mg total) by mouth every 8 (eight) hours as needed for nausea or vomiting. 20 tablet 0   Prenatal Vit-Fe Fumarate-FA (PRENATAL MULTIVITAMIN) TABS tablet Take 1 tablet by mouth daily.     risperiDONE (RISPERDAL) 0.5 MG tablet Take 1 tablet (0.5 mg total) by mouth at bedtime. 30 tablet 3   valACYclovir (VALTREX) 500 MG tablet Take 1 tablet (500 mg total) by mouth 2 (two) times daily. 14 tablet 0   valbenazine (INGREZZA) 60 MG capsule Take 1 capsule (60 mg total) by mouth daily. 30 capsule 30   venlafaxine XR (EFFEXOR XR) 75 MG 24 hr capsule Take 1 capsule (75 mg total) by mouth daily. 30 capsule 3   VIVITROL 380 MG SUSR 380 mg by Subdermal route every 28 (twenty-eight) days.     PTA Medications: Prior  to Admission medications   Medication Sig Start Date End Date Taking? Authorizing Provider  albuterol (VENTOLIN HFA) 108 (90 Base) MCG/ACT inhaler Inhale 1-2 puffs into the lungs every 6 (six) hours as needed for wheezing or shortness of breath. 01/21/20   Aldean Baker, NP  amoxicillin-clavulanate (AUGMENTIN) 875-125 MG tablet Take 1 tablet by mouth every 12 (twelve) hours for 6 days. 11/12/21 11/18/21  Mardene Sayer, MD  benztropine (COGENTIN) 0.5 MG tablet TAKE 1 TABLET(0.5 MG) BY MOUTH THREE TIMES DAILY 09/11/21   Toy Cookey E, NP  dicyclomine (BENTYL) 20 MG tablet Take 20 mg by mouth every 6 (six) hours as needed for cramping. IBS 11/03/20   [provider]  ondansetron (ZOFRAN) 4 MG tablet Take 1 tablet (4 mg total) by mouth every 6 (six) hours. 11/12/21   Mardene Sayer, MD   ondansetron (ZOFRAN-ODT) 4 MG disintegrating tablet Take 1 tablet (4 mg total) by mouth every 8 (eight) hours as needed for nausea or vomiting. 11/07/21   Virgina Norfolk, DO  Prenatal Vit-Fe Fumarate-FA (PRENATAL MULTIVITAMIN) TABS tablet Take 1 tablet by mouth daily.    [provider]  risperiDONE (RISPERDAL) 0.5 MG tablet Take 1 tablet (0.5 mg total) by mouth at bedtime. 09/11/21   Shanna Cisco, NP  valACYclovir (VALTREX) 500 MG tablet Take 1 tablet (500 mg total) by mouth 2 (two) times daily. 02/16/20   Storm Frisk, MD  valbenazine [facility]) 60 MG capsule Take 1 capsule (60 mg total) by mouth daily. 09/11/21   Shanna Cisco, NP  venlafaxine XR (EFFEXOR XR) 75 MG 24 hr capsule Take 1 capsule (75 mg total) by mouth daily. 09/11/21   Shanna Cisco, NP  VIVITROL 380 MG SUSR 380 mg by Subdermal route every 28 (twenty-eight) days. 10/06/20   [provider]    Patient Stressors: Medication change or noncompliance   Substance abuse    Patient Strengths: Motivation for treatment/growth   Treatment Modalities: Medication Management, Group therapy, Case management,  1 to 1 session with clinician, Psychoeducation, Recreational therapy.   Physician Treatment Plan for Primary and Secondary Diagnosis:  Final diagnoses:  Cocaine abuse (HCC)  Psychoactive substance-induced psychosis (HCC)  Alcohol use disorder, severe, in controlled environment (HCC)   Long Term Goal(s): Improvement in symptoms so as ready for discharge  Short Term Goals:    Medication Management: Evaluate patient's response, side effects, and tolerance of medication regimen.  Therapeutic Interventions: 1 to 1 sessions, Unit Group sessions and Medication administration.  Evaluation of Outcomes: Adequate for Discharge  LCSW Treatment Plan for Primary Diagnosis:  Final diagnoses:  Cocaine abuse (HCC)  Psychoactive substance-induced psychosis (HCC)  Alcohol use disorder, severe, in  controlled environment [facility])    Long Term Goal(s): Safe transition to appropriate next level of care at discharge.  Short Term Goals: Facilitate acceptance of mental health diagnosis and concerns through verbal commitment to aftercare plan and appointments at discharge. and Identify minimum of 2 triggers associated with mental health/substance abuse issues with treatment team members.  Therapeutic Interventions: Assess for all discharge needs, 1 to 1 time with Child psychotherapist, Explore available resources and support systems, Assess for adequacy in community support network, Educate family and significant other(s) on suicide prevention, Complete Psychosocial Assessment, Interpersonal group therapy.  Evaluation of Outcomes: Adequate for Discharge   Progress in Treatment: Attending groups: Yes. Participating in groups: Yes. Taking medication as prescribed: Yes. Toleration medication: Yes. Family/Significant other contact made: No, will contact:  no one  at this time Patient understands diagnosis: Yes. Discussing patient identified problems/goals with staff: Yes. Medical problems stabilized or resolved: Yes. Denies suicidal/homicidal ideation: Yes. Issues/concerns per patient self-inventory: No. Other: None   New problem(s) identified: No, Describe:  None   New Short Term/Long Term Goal(s): Dannon has not identified any long term goals at this time.   Patient Goals:  "To get help"  Discharge Plan or Barriers: [facility] admissions stating that the patient has been accepted for a "screening for admission" appointment on Thursday, 11/15/2021 at 9:00AM. Lisett will require a 14-day supply of medications, in addition to her 30-day prescriptions for admission.   Reason for Continuation of Hospitalization: Medication stabilization  Estimated Length of Stay: Discharge, Thursday 11/15/21  Last 3 [place] Suicide Severity Risk Score: Flowsheet Row ED from 11/12/2021 in  [facility] ED from 11/11/2021 in [facility] EMERGENCY DEPARTMENT ED from 10/10/2021 in [facility] EMERGENCY DEPARTMENT  C-SSRS RISK CATEGORY No Risk High Risk No Risk       Last PHQ 2/9 Scores:    09/11/2021   10:43 AM 05/21/2021    8:59 AM 04/24/2021    3:00 PM  Depression screen PHQ 2/9  Decreased Interest 3 3 2   Down, Depressed, Hopeless 3 3 2   PHQ - 2 Score 6 6 4   Altered sleeping 2 3 3   Tired, decreased energy 3 3 2   Change in appetite 2 2 2   Feeling bad or failure about yourself  3 2 2   Trouble concentrating 3 3 3   Moving slowly or fidgety/restless 3 3 3   Suicidal thoughts 0 0 0  PHQ-9 Score 22 22 19   Difficult doing work/chores Very difficult Very difficult Extremely dIfficult    Scribe for Treatment Team: , LCSW 11/14/2021 9:14 AM

## 2021-11-14 NOTE — Clinical Social Work Psych Note (Signed)
LCSW Update Note  Darlene Gates reports feeling "alright" this morning. Darlene Gates continues to report feeling nauseous and sweaty, however she denies having any additional withdrawal or physical complaints.   Darlene Gates denied having any SI, HI or AVH at this time.   [facility] - Darlene Gates has been accepted for a "screening for admission" appointment on Thursday, 11/15/2021 at 9:00AM. Darlene Gates will require a 14-day supply of medications, in addition to her 30-day prescriptions for admission.   Darlene Gates denied having any additional questions or concerns at this time.   LCSW has authorized a taxi voucher to ensure transportation in the morning. Taxi voucher will be placed on the patient's physical chart in the nursing station on the [facility]   LCSW will continue to follow until discharge.    Baldo Daub, MSW, LCSW Clinical Child psychotherapist (Facility Based Crisis) [facility]

## 2021-11-14 NOTE — ED Notes (Signed)
Patient A&Ox4. Denies intent to harm self/others when asked. Denies A/VH. Patient denies any physical complaints when asked. No acute distress noted. Support and encouragement provided. Routine safety checks conducted according to facility protocol. Encouraged patient to notify staff if thoughts of harm toward self or others arise. Patient verbalize understanding and agreement. Will continue to monitor for safety.    

## 2021-11-14 NOTE — ED Notes (Signed)
Pt sleeping in no acute distress. RR even and unlabored. Safety maintained. 

## 2021-11-14 NOTE — ED Notes (Signed)
Pt is in the bed sleeping. Respirations are even and unlabored. No acute distress noted. Will continue to monitor for safety. 

## 2021-11-14 NOTE — ED Provider Notes (Cosign Needed Addendum)
FBC/OBS ASAP Discharge Summary  Date and Time: 11/14/2021 4:29 PM  Name: Darlene Gates  MRN:  [id number]   Discharge Diagnoses:  Final diagnoses:  Cocaine abuse (HCC)  Psychoactive substance-induced psychosis (HCC)  Alcohol use disorder, severe, in controlled environment [facility])    Subjective: Patient seen and assessed at bedside. Denies SI/HI/AVH. Minimal withdrawal symptoms primarily fatigue. No questions at this time and eager to go to [facility]. Feels ready to discharge to [facility] at this time.  Stay Summary by Problem List Darlene Gates is a 36 y.o. female with PMHx of bipolar 1 disorder, anxiety, depression, polysubstance abuse (cocaine, opiates, benzodiazepines, ecstasy, alcohol) being admitted to [facility] for substance use detox and residential substance rehab treatment. 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 Patient started on CIWA protocol peaked at score of 7. Patient's CIWA upon discharge was 3. Patient was resumed on risperdal, effexor, and ingrezza for her substance induced mood/psychotic disorder.    Other chronic conditions were medically managed with home medications and formulary alternatives as necessary (asthma)    Total Time spent with patient: 45 minutes  Past Psychiatric History: substance abuse (cocaine, alcohol, xanax) Past Medical History:  Past Medical History:  Diagnosis Date   Anxiety    Bipolar 1 disorder (HCC)    Depression    History of alcohol abuse    History of substance abuse (HCC)    No past surgical history on file. Family History:  Family History  Problem Relation Age of Onset   Alcohol abuse Mother    Bipolar disorder Mother    Depression Mother    Alcohol abuse Father    Hyperlipidemia Father    Hypertension Father    Alcohol abuse  Maternal Grandmother    Bipolar disorder Maternal Grandmother    Alcohol abuse Maternal Grandfather    Family Psychiatric History: see above Social History:  Social History   Substance and Sexual Activity  Alcohol Use Yes   Alcohol/week: 4.0 standard drinks of alcohol   Types: 3 Cans of beer, 1 Shots of liquor per week   Comment: pt reports she drincks daily     Social History   Substance and Sexual Activity  Drug Use Not on file   Comment: pt reports she uses daily    Social History   Socioeconomic History   Marital status: Married    Spouse name: Not on file   Number of children: Not on file   Years of education: Not on file   Highest education level: Not on file  Occupational History   Not on file  Tobacco Use   Smoking status: Every Day    Packs/day: 3.00    Types: Cigarettes   Smokeless tobacco: Never  Vaping Use   Vaping Use: Never used  Substance and Sexual Activity   Alcohol use: Yes    Alcohol/week: 4.0 standard drinks of alcohol    Types: 3 Cans of beer, 1 Shots of liquor per week    Comment: pt reports she drincks daily   Drug use: Not on file    Comment: pt reports she uses daily   Sexual activity: Yes    Partners: Male    Birth control/protection: Injection, Condom  Other Topics Concern   Not on file  Social History Narrative   Not on file   Social Determinants of Health  Financial Resource Strain: Not on file  Food Insecurity: Not on file  Transportation Needs: Not on file  Physical Activity: Not on file  Stress: Not on file  Social Connections: Not on file   SDOH:  SDOH Screenings   Alcohol Screen: Medium Risk (01/15/2020)   Alcohol Screen    Last Alcohol Screening Score (AUDIT): 11  Depression (PHQ2-9): High Risk (09/11/2021)   Depression (PHQ2-9)    PHQ-2 Score: 22  Financial Resource Strain: Not on file  Food Insecurity: Not on file  Housing: Not on file  Physical Activity: Not on file  Social Connections: Not on file   Stress: Not on file  Tobacco Use: High Risk (11/14/2021)   Patient History    Smoking Tobacco Use: Every Day    Smokeless Tobacco Use: Never    Passive Exposure: Not on file  Transportation Needs: Not on file    Tobacco Cessation:  N/A, patient does not currently use tobacco products  Current Medications:  Current Facility-Administered Medications  Medication Dose Route Frequency Provider Last Rate Last Admin   albuterol (VENTOLIN HFA) 108 (90 Base) MCG/ACT inhaler 1-2 puff  1-2 puff Inhalation Q6H PRN Park Pope, MD       alum & mag hydroxide-simeth (MAALOX/MYLANTA) 200-200-20 MG/5ML suspension 30 mL  30 mL Oral Q4H PRN Park Pope, MD       amoxicillin-clavulanate (AUGMENTIN) 875-125 MG per tablet 1 tablet  1 tablet Oral BID Park Pope, MD   1 tablet at 11/14/21 0926   dicyclomine (BENTYL) tablet 20 mg  20 mg Oral Q6H PRN Park Pope, MD   20 mg at 11/13/21 1437   hydrOXYzine (ATARAX) tablet 25 mg  25 mg Oral TID PRN Park Pope, MD   25 mg at 11/13/21 1437   magnesium hydroxide (MILK OF MAGNESIA) suspension 30 mL  30 mL Oral Daily PRN Park Pope, MD       melatonin tablet 3 mg  3 mg Oral QHS Park Pope, MD   3 mg at 11/13/21 2107   ondansetron (ZOFRAN-ODT) disintegrating tablet 4 mg  4 mg Oral Q8H PRN Park Pope, MD   4 mg at 11/13/21 0842   risperiDONE (RISPERDAL) tablet 0.5 mg  0.5 mg Oral QHS Park Pope, MD   0.5 mg at 11/13/21 2108   traZODone (DESYREL) tablet 50 mg  50 mg Oral QHS PRN Park Pope, MD       valbenazine [facility]) capsule 60 mg  60 mg Oral Daily Park Pope, MD   60 mg at 11/14/21 0925   venlafaxine XR (EFFEXOR-XR) 24 hr capsule 75 mg  75 mg Oral Q breakfast Park Pope, MD   75 mg at 11/14/21 1188   Current Outpatient Medications  Medication Sig Dispense Refill   albuterol (VENTOLIN HFA) 108 (90 Base) MCG/ACT inhaler Inhale 1-2 puffs into the lungs every 6 (six) hours as needed for wheezing or shortness of breath. 6.7 g 0   amoxicillin-clavulanate (AUGMENTIN) 875-125  MG tablet Take 1 tablet by mouth 2 (two) times daily for 3 days. 6 tablet 0   dicyclomine (BENTYL) 20 MG tablet Take 20 mg by mouth every 6 (six) hours as needed for cramping. IBS     hydrOXYzine (ATARAX) 25 MG tablet Take 1 tablet (25 mg total) by mouth 3 (three) times daily as needed for anxiety. 30 tablet 0   ondansetron (ZOFRAN-ODT) 4 MG disintegrating tablet Take 1 tablet (4 mg total) by mouth every 8 (eight) hours as needed for nausea or  vomiting. 20 tablet 0   risperiDONE (RISPERDAL) 0.5 MG tablet Take 1 tablet (0.5 mg total) by mouth at bedtime. 30 tablet 0   traZODone (DESYREL) 50 MG tablet Take 1 tablet (50 mg total) by mouth at bedtime as needed for sleep. 30 tablet 0   valbenazine (INGREZZA) 60 MG capsule Take 1 capsule (60 mg total) by mouth daily. 30 capsule 0   [START ON 11/15/2021] venlafaxine XR (EFFEXOR XR) 75 MG 24 hr capsule Take 1 capsule (75 mg total) by mouth daily with breakfast. 30 capsule 0    PTA Medications: (Not in a hospital admission)       11/12/2021    3:10 PM 09/11/2021   10:43 AM 05/21/2021    8:59 AM  Depression screen PHQ 2/9  Decreased Interest 3 3 3   Down, Depressed, Hopeless 1 3 3   PHQ - 2 Score 4 6 6   Altered sleeping 3 2 3   Tired, decreased energy 3 3 3   Change in appetite 2 2 2   Feeling bad or failure about yourself  2 3 2   Trouble concentrating 3 3 3   Moving slowly or fidgety/restless 2 3 3   Suicidal thoughts 0 0 0  PHQ-9 Score 19 22 22   Difficult doing work/chores  Very difficult Very difficult    Flowsheet Row ED from 11/12/2021 in [facility] ED from 11/11/2021 in [facility] EMERGENCY DEPARTMENT ED from 10/10/2021 in [facility] EMERGENCY DEPARTMENT  C-SSRS RISK CATEGORY No Risk High Risk No Risk       Musculoskeletal  Strength & Muscle Tone: within normal limits Gait & Station: normal Patient leans: N/A  Psychiatric Specialty Exam  Presentation  General Appearance:  Appropriate for Environment   Eye Contact:Fair   Speech:Clear and Coherent   Speech Volume:Normal   Handedness:Right    Mood and Affect  Mood:Depressed; Anxious   Affect:Congruent    Thought Process  Thought Processes:Coherent   Descriptions of Associations:Intact   Orientation:Full (Time, Place and Person)   Thought Content:Logical      Hallucinations:No data recorded  Ideas of Reference:None   Suicidal Thoughts:No data recorded  Homicidal Thoughts:No data recorded   Sensorium  Memory:Immediate Good   Judgment:Fair   Insight:Fair    Executive Functions  Concentration:Fair   Attention Span:Fair   Recall:Good   Fund of Knowledge:Good   Language:Good    Psychomotor Activity  Psychomotor Activity:No data recorded   Assets  Assets:Communication Skills; Desire for Improvement; Physical Health; Resilience    Sleep  Sleep:No data recorded   No data recorded   Physical Exam  Physical Exam Vitals and nursing note reviewed.  Constitutional:      General: She is not in acute distress.    Appearance: She is well-developed.  HENT:     Head: Normocephalic and atraumatic.  Eyes:     Conjunctiva/sclera: Conjunctivae normal.  Cardiovascular:     Rate and Rhythm: Normal rate and regular rhythm.     Heart sounds: No murmur heard. Pulmonary:     Effort: Pulmonary effort is normal. No respiratory distress.     Breath sounds: Normal breath sounds.  Abdominal:     Palpations: Abdomen is soft.     Tenderness: There is no abdominal tenderness.  Musculoskeletal:        General: No swelling.     Cervical back: Neck supple.  Skin:    General: Skin is warm and dry.     Capillary Refill: Capillary refill takes  less than 2 seconds.  Neurological:     Mental Status: She is alert.  Psychiatric:        Mood and Affect: Mood normal.    Review of Systems  Constitutional:  Positive for malaise/fatigue.  Respiratory:  Negative for  shortness of breath.   Cardiovascular:  Negative for chest pain.  Gastrointestinal:  Negative for abdominal pain, constipation, diarrhea, heartburn, nausea and vomiting.  Neurological:  Negative for headaches.   Blood pressure 121/78, pulse 69, temperature 98.4 F (36.9 C), temperature source Oral, resp. rate 18, SpO2 100 %, unknown if currently breastfeeding. There is no height or weight on file to calculate BMI.  Demographic Factors:  Unemployed  Loss Factors: Financial problems/change in socioeconomic status  Historical Factors: Impulsivity  Risk Reduction Factors:   Positive social support  Continued Clinical Symptoms:  Alcohol/Substance Abuse/Dependencies More than one psychiatric diagnosis  Cognitive Features That Contribute To Risk:  None    Suicide Risk:  Mild:  Suicidal ideation of limited frequency, intensity, duration, and specificity.  There are no identifiable plans, no associated intent, mild dysphoria and related symptoms, good self-control (both objective and subjective assessment), few other risk factors, and identifiable protective factors, including available and accessible social support.  Plan Of Care/Follow-up recommendations:   Follow-up recommendations:   Activity:  as tolerated Diet:  heart healthy   Comments:  Prescriptions were given at discharge.  Patient is agreeable with the discharge plan.  Patient was given an opportunity to ask questions.  Patient appears to feel comfortable with discharge and denies any current suicidal or homicidal thoughts.    Patient is instructed prior to discharge to: Take all medications as prescribed by mental healthcare provider. Report any adverse effects and or reactions from the medicines to outpatient provider promptly. In the event of worsening symptoms, patient is instructed to call the crisis hotline, 911 and or go to the nearest ED for appropriate evaluation and treatment of symptoms. Patient is to follow-up  with primary care provider for other medical issues, concerns and or health care needs.   Park Pope, MD 11/14/2021, 4:29 PM

## 2021-11-14 NOTE — Discharge Instructions (Signed)

## 2021-11-14 NOTE — ED Notes (Signed)
Pt ate dinner. No acute distress noted. Denies concerns. Currently, in bathroom. Informed pt to notify staff with any needs or concerns. Will continue to monitor for safety.

## 2021-11-15 NOTE — ED Notes (Signed)
Received patient this PM. Patient slept peacefully during the night. Patient respirations are even and unlabored. Will continue to monitor for safety.

## 2021-11-26 ENCOUNTER — Other Ambulatory Visit (HOSPITAL_COMMUNITY)
Admission: RE | Admit: 2021-11-26 | Discharge: 2021-11-26 | Disposition: A | Discharge status: Home / Self Care | Payer: Medicaid Other | Source: Ambulatory Visit | Attending: Physician Assistant | Admitting: Physician Assistant

## 2021-11-26 ENCOUNTER — Encounter: Payer: Self-pay | Admitting: Physician Assistant

## 2021-11-26 ENCOUNTER — Ambulatory Visit: Payer: Medicaid Other | Admitting: Physician Assistant

## 2021-11-26 VITALS — BP 126/71 | HR 79 | Resp 18 | Ht 64.0 in | Wt 185.0 lb

## 2021-11-26 DIAGNOSIS — T3695XA Adverse effect of unspecified systemic antibiotic, initial encounter: Secondary | ICD-10-CM

## 2021-11-26 DIAGNOSIS — F314 Bipolar disorder, current episode depressed, severe, without psychotic features: Secondary | ICD-10-CM | POA: Diagnosis not present

## 2021-11-26 DIAGNOSIS — F1010 Alcohol abuse, uncomplicated: Secondary | ICD-10-CM

## 2021-11-26 DIAGNOSIS — Z3202 Encounter for pregnancy test, result negative: Secondary | ICD-10-CM

## 2021-11-26 DIAGNOSIS — J4520 Mild intermittent asthma, uncomplicated: Secondary | ICD-10-CM

## 2021-11-26 DIAGNOSIS — B9689 Other specified bacterial agents as the cause of diseases classified elsewhere: Secondary | ICD-10-CM

## 2021-11-26 DIAGNOSIS — F1410 Cocaine abuse, uncomplicated: Secondary | ICD-10-CM

## 2021-11-26 DIAGNOSIS — N760 Acute vaginitis: Secondary | ICD-10-CM

## 2021-11-26 DIAGNOSIS — K580 Irritable bowel syndrome with diarrhea: Secondary | ICD-10-CM

## 2021-11-26 DIAGNOSIS — F5104 Psychophysiologic insomnia: Secondary | ICD-10-CM

## 2021-11-26 DIAGNOSIS — B379 Candidiasis, unspecified: Secondary | ICD-10-CM

## 2021-11-26 DIAGNOSIS — F250 Schizoaffective disorder, bipolar type: Secondary | ICD-10-CM

## 2021-11-26 DIAGNOSIS — F411 Generalized anxiety disorder: Secondary | ICD-10-CM | POA: Diagnosis not present

## 2021-11-26 DIAGNOSIS — G2401 Drug induced subacute dyskinesia: Secondary | ICD-10-CM | POA: Diagnosis not present

## 2021-11-26 LAB — POCT URINALYSIS DIP (CLINITEK)
Bilirubin, UA: NEGATIVE
Glucose, UA: NEGATIVE mg/dL
Ketones, POC UA: NEGATIVE mg/dL
Nitrite, UA: NEGATIVE
POC PROTEIN,UA: NEGATIVE
Spec Grav, UA: 1.025 (ref 1.010–1.025)
Urobilinogen, UA: 0.2 E.U./dL
pH, UA: 6.5 (ref 5.0–8.0)

## 2021-11-26 MED ORDER — PRAZOSIN HCL 2 MG PO CAPS: 2.0000 mg | ORAL_CAPSULE | Freq: Every day | ORAL | 1 refills | Status: DC

## 2021-11-26 MED ORDER — ONDANSETRON 4 MG PO TBDP: 4.0000 mg | ORAL_TABLET | Freq: Three times a day (TID) | ORAL | 1 refills | Status: DC | PRN

## 2021-11-26 MED ORDER — TRAZODONE HCL 50 MG PO TABS: 50.0000 mg | ORAL_TABLET | Freq: Every evening | ORAL | 1 refills | Status: DC | PRN

## 2021-11-26 MED ORDER — BENZTROPINE MESYLATE 1 MG PO TABS: 1.0000 mg | ORAL_TABLET | Freq: Two times a day (BID) | ORAL | 1 refills | Status: DC

## 2021-11-26 MED ORDER — ALBUTEROL SULFATE HFA 108 (90 BASE) MCG/ACT IN AERS: 1.0000 | INHALATION_SPRAY | Freq: Four times a day (QID) | RESPIRATORY_TRACT | 1 refills | Status: DC | PRN

## 2021-11-26 MED ORDER — DICYCLOMINE HCL 20 MG PO TABS: 20.0000 mg | ORAL_TABLET | Freq: Four times a day (QID) | ORAL | 1 refills | Status: DC | PRN

## 2021-11-26 MED ORDER — VENLAFAXINE HCL ER 75 MG PO CP24: 75.0000 mg | ORAL_CAPSULE | Freq: Every day | ORAL | 1 refills | Status: DC

## 2021-11-26 MED ORDER — OLANZAPINE 5 MG PO TABS: 5.0000 mg | ORAL_TABLET | Freq: Every day | ORAL | 1 refills | Status: DC

## 2021-11-26 MED ORDER — VALBENAZINE TOSYLATE 60 MG PO CAPS: 60.0000 mg | ORAL_CAPSULE | Freq: Every day | ORAL | 1 refills | Status: DC

## 2021-11-26 MED ORDER — FLUCONAZOLE 150 MG PO TABS: 150.0000 mg | ORAL_TABLET | Freq: Once | ORAL | 0 refills | Status: AC

## 2021-11-26 MED ORDER — METRONIDAZOLE 0.75 % VA GEL: 1.0000 | Freq: Two times a day (BID) | VAGINAL | 0 refills | Status: AC

## 2021-11-26 MED ORDER — METRONIDAZOLE 0.75 % VA GEL: 1.0000 | Freq: Two times a day (BID) | VAGINAL | 0 refills | Status: DC

## 2021-11-26 MED ORDER — HYDROXYZINE HCL 25 MG PO TABS: 25.0000 mg | ORAL_TABLET | Freq: Three times a day (TID) | ORAL | 1 refills | Status: DC | PRN

## 2021-11-26 MED ORDER — RISPERIDONE 0.5 MG PO TABS: 0.5000 mg | ORAL_TABLET | Freq: Every day | ORAL | 1 refills | Status: DC

## 2021-11-26 NOTE — Progress Notes (Unsigned)
   Established Patient Office Visit  Subjective   Patient ID: Darlene Gates, female    DOB: 08-Jan-1986  Age: 36 y.o. MRN: [id number]  No chief complaint on file.   [facility] - 17th of aug  No ltc yet  Discharge for the past - 2 weeks Itching Sometime burning   Up and down - moods Sleep is good  Period was short Did have miscarriage in July   Needs help with meds for TD Happens without risperdal -2 or so months     {History (Optional):23778}  ROS    Objective:     LMP  (LMP Unknown)  {Vitals History (Optional):23777}  Physical Exam   No results found for any visits on 11/26/21.  {Labs (Optional):23779}  The ASCVD Risk score (Arnett DK, et al., 2019) failed to calculate for the following reasons:   The 2019 ASCVD risk score is only valid for ages 51 to 75    Assessment & Plan:   Problem List Items Addressed This Visit   None   No follow-ups on file.    Kasandra Knudsen Mayers, PA-C

## 2021-11-26 NOTE — Patient Instructions (Signed)
You are going to use MetroGel twice daily, and you will take Diflucan once.  We will call you with your lab results.  You will follow-up with the mobile team in 2 weeks for further evaluation.  Roney Jaffe, PA-C Physician Assistant [facility] [email or web address]   Bacterial Vaginosis  Bacterial vaginosis is an infection that occurs when the normal balance of bacteria in the vagina changes. This change is caused by an overgrowth of certain bacteria in the vagina. Bacterial vaginosis is the most common vaginal infection among females aged 71 to 65 years. This condition increases the risk of sexually transmitted infections (STIs). Treatment can help reduce this risk. Treatment is very important for pregnant women because this condition can cause babies to be born early (prematurely) or at a low birth weight. What are the causes? This condition is caused by an increase in harmful bacteria that are normally present in small amounts in the vagina. However, the exact reason this condition develops is not known. You cannot get bacterial vaginosis from toilet seats, bedding, swimming pools, or contact with objects around you. What increases the risk? The following factors may make you more likely to develop this condition: Having a new sexual partner or multiple sexual partners, or having unprotected sex. Douching. Having an intrauterine device (IUD). Smoking. Abusing drugs and alcohol. This may lead to riskier sexual behavior. Taking certain antibiotic medicines. Being pregnant. What are the signs or symptoms? Some women with this condition have no symptoms. Symptoms may include: Wallace Cullens or white vaginal discharge. The discharge can be watery or foamy. A fish-like odor with discharge, especially after sex or during menstruation. Itching in and around the vagina. Burning or pain with urination. How is this diagnosed? This condition is  diagnosed based on: Your medical history. A physical exam of the vagina. Checking a sample of vaginal fluid for harmful bacteria or abnormal cells. How is this treated? This condition is treated with antibiotic medicines. These may be given as a pill, a vaginal cream, or a medicine that is put into the vagina (suppository). If the condition comes back after treatment, a second round of antibiotics may be needed. Follow these instructions at home: Medicines Take or apply over-the-counter and prescription medicines only as told by your health care provider. Take or apply your antibiotic medicine as told by your health care provider. Do not stop using the antibiotic even if you start to feel better. General instructions If you have a female sexual partner, tell her that you have a vaginal infection. She should follow up with her health care provider. If you have a female sexual partner, he does not need treatment. Avoid sexual activity until you finish treatment. Drink enough fluid to keep your urine pale yellow. Keep the area around your vagina and rectum clean. Wash the area daily with warm water. Wipe yourself from front to back after using the toilet. If you are breastfeeding, talk to your health care provider about continuing breastfeeding during treatment. Keep all follow-up visits. This is important. How is this prevented? Self-care Do not douche. Wash the outside of your vagina with warm water only. Wear cotton or cotton-lined underwear. Avoid wearing tight pants and pantyhose, especially during the summer. Safe sex Use protection when having sex. This includes: Using condoms. Using dental dams. This is a thin layer of a material made of latex or polyurethane that protects the mouth during oral sex. Limit the number of sexual partners. To help prevent bacterial vaginosis, it  is best to have sex with just one partner (monogamous relationship). Make sure you and your sexual partner are  tested for STIs. Drugs and alcohol Do not use any products that contain nicotine or tobacco. These products include cigarettes, chewing tobacco, and vaping devices, such as e-cigarettes. If you need help quitting, ask your health care provider. Do not use drugs. Do not drink alcohol if: Your health care provider tells you not to do this. You are pregnant, may be pregnant, or are planning to become pregnant. If you drink alcohol: Limit how much you have to 0-1 drink a day. Be aware of how much alcohol is in your drink. In the [place] one drink equals one 12 oz bottle of beer (355 mL), one 5 oz glass of wine (148 mL), or one 1 oz glass of hard liquor (44 mL). Where to find more information Centers for Disease Control and Prevention: http://www.wolf.info/ American Sexual Health Association (ASHA): www.ashastd.org [place] Department of Health and Financial controller, Office on Women's Health: [place].tn Contact a health care provider if: Your symptoms do not improve, even after treatment. You have more discharge or pain when urinating. You have a fever or chills. You have pain in your abdomen or pelvis. You have pain during sex. You have vaginal bleeding between menstrual periods. Summary Bacterial vaginosis is a vaginal infection that occurs when the normal balance of bacteria in the vagina changes. It results from an overgrowth of certain bacteria. This condition increases the risk of sexually transmitted infections (STIs). Getting treated can help reduce this risk. Treatment is very important for pregnant women because this condition can cause babies to be born early (prematurely) or at low birth weight. This condition is treated with antibiotic medicines. These may be given as a pill, a vaginal cream, or a medicine that is put into the vagina (suppository). This information is not intended to replace advice given to you by your health care provider. Make sure you discuss any questions you have with  your health care provider. Document Revised: 09/16/2019 Document Reviewed: 09/16/2019 Elsevier Patient Education  [place].

## 2021-11-27 ENCOUNTER — Encounter: Payer: Self-pay | Admitting: Physician Assistant

## 2021-11-27 ENCOUNTER — Other Ambulatory Visit (HOSPITAL_COMMUNITY): Payer: Self-pay

## 2021-11-27 LAB — CERVICOVAGINAL ANCILLARY ONLY
Bacterial Vaginitis (gardnerella): POSITIVE — AB
Candida Glabrata: NEGATIVE
Candida Vaginitis: NEGATIVE
Chlamydia: NEGATIVE
Comment: NEGATIVE
Comment: NEGATIVE
Comment: NEGATIVE
Comment: NEGATIVE
Comment: NEGATIVE
Comment: NORMAL
Neisseria Gonorrhea: NEGATIVE
Trichomonas: NEGATIVE

## 2021-12-05 ENCOUNTER — Telehealth (HOSPITAL_COMMUNITY): Payer: Medicaid Other | Admitting: Psychiatry

## 2021-12-05 ENCOUNTER — Encounter (HOSPITAL_COMMUNITY): Payer: Self-pay

## 2021-12-25 ENCOUNTER — Encounter (HOSPITAL_COMMUNITY): Payer: Self-pay | Admitting: *Deleted

## 2021-12-25 ENCOUNTER — Other Ambulatory Visit: Payer: Self-pay

## 2021-12-25 ENCOUNTER — Ambulatory Visit (HOSPITAL_COMMUNITY)
Admission: EM | Admit: 2021-12-25 | Discharge: 2021-12-25 | Disposition: A | Discharge status: Home / Self Care | Payer: Medicaid Other | Attending: Family Medicine | Admitting: Family Medicine

## 2021-12-25 DIAGNOSIS — R197 Diarrhea, unspecified: Secondary | ICD-10-CM | POA: Diagnosis present

## 2021-12-25 DIAGNOSIS — J069 Acute upper respiratory infection, unspecified: Secondary | ICD-10-CM

## 2021-12-25 DIAGNOSIS — K580 Irritable bowel syndrome with diarrhea: Secondary | ICD-10-CM | POA: Diagnosis not present

## 2021-12-25 DIAGNOSIS — Z20822 Contact with and (suspected) exposure to covid-19: Secondary | ICD-10-CM | POA: Insufficient documentation

## 2021-12-25 DIAGNOSIS — R112 Nausea with vomiting, unspecified: Secondary | ICD-10-CM | POA: Diagnosis not present

## 2021-12-25 DIAGNOSIS — N939 Abnormal uterine and vaginal bleeding, unspecified: Secondary | ICD-10-CM

## 2021-12-25 DIAGNOSIS — H1032 Unspecified acute conjunctivitis, left eye: Secondary | ICD-10-CM

## 2021-12-25 LAB — COMPREHENSIVE METABOLIC PANEL
ALT: 26 U/L (ref 0–44)
AST: 37 U/L (ref 15–41)
Albumin: 4.4 g/dL (ref 3.5–5.0)
Alkaline Phosphatase: 90 U/L (ref 38–126)
Anion gap: 12 (ref 5–15)
BUN: <5 mg/dL — ABNORMAL LOW (ref 6–20)
CO2: 26 mmol/L (ref 22–32)
Calcium: 9.3 mg/dL (ref 8.9–10.3)
Chloride: 101 mmol/L (ref 98–111)
Creatinine, Ser: 0.86 mg/dL (ref 0.44–1.00)
GFR, Estimated: >60 mL/min (ref >60)
Glucose, Bld: 107 mg/dL — ABNORMAL HIGH (ref 70–99)
Potassium: 4.2 mmol/L (ref 3.5–5.1)
Sodium: 139 mmol/L (ref 135–145)
Total Bilirubin: 1.7 mg/dL — ABNORMAL HIGH (ref 0.3–1.2)
Total Protein: 7.7 g/dL (ref 6.5–8.1)

## 2021-12-25 LAB — POCT URINALYSIS DIPSTICK, ED / UC
Glucose, UA: NEGATIVE mg/dL
Leukocytes,Ua: NEGATIVE
Nitrite: POSITIVE — AB
Protein, ur: 100 mg/dL — AB
Specific Gravity, Urine: 1.025 (ref 1.005–1.030)
Urobilinogen, UA: 1 mg/dL (ref 0.0–1.0)
pH: 6 (ref 5.0–8.0)

## 2021-12-25 LAB — CBC
HCT: 41.8 % (ref 36.0–46.0)
Hemoglobin: 14.4 g/dL (ref 12.0–15.0)
MCH: 32.5 pg (ref 26.0–34.0)
MCHC: 34.4 g/dL (ref 30.0–36.0)
MCV: 94.4 fL (ref 80.0–100.0)
Platelets: 312 10*3/uL (ref 150–400)
RBC: 4.43 MIL/uL (ref 3.87–5.11)
RDW: 12.7 % (ref 11.5–15.5)
WBC: 6.6 10*3/uL (ref 4.0–10.5)
nRBC: 0 % (ref 0.0–0.2)

## 2021-12-25 LAB — POC URINE PREG, ED: Preg Test, Ur: NEGATIVE

## 2021-12-25 LAB — HCG, SERUM, QUALITATIVE: Preg, Serum: NEGATIVE

## 2021-12-25 MED ORDER — ONDANSETRON 4 MG PO TBDP
4.0000 mg | ORAL_TABLET | Freq: Three times a day (TID) | ORAL | 0 refills | Status: DC | PRN
Start: 2021-12-25 — End: 2022-10-22

## 2021-12-25 MED ORDER — FAMOTIDINE 20 MG PO TABS: 20.0000 mg | ORAL_TABLET | Freq: Two times a day (BID) | ORAL | 0 refills | Status: DC

## 2021-12-25 MED ORDER — GENTAMICIN SULFATE 0.3 % OP SOLN: 2.0000 [drp] | Freq: Three times a day (TID) | OPHTHALMIC | 0 refills | Status: AC

## 2021-12-25 NOTE — ED Provider Notes (Addendum)
[facility]    CSN: [id number] Arrival date & time: 12/25/21  0254      History   Chief Complaint Chief Complaint  Patient presents with   Back Pain   Chills   Eye Drainage   Nausea   Diarrhea    HPI Darlene Gates is a 36 y.o. female.    Back Pain Diarrhea  Here for a 1 week history of nausea, vomiting, and diarrhea.  She has been throwing up about 3 times a day and having loose stools about 3 times a day no bright red blood in any output.  Yesterday she did have episode of a very black sticky stool, but that has resolved since then.  She threw up once and had 1 loose stool today.  Stool today was normal brown  She has had some subjective fever and chills  She also admits to cough and congestion, unclear when that began.  She is having some abdominal cramping sometimes, but no dysuria.  She also has redness and irritation and tearing from her left eye  She miscarried in July, and had not had any bleeding until about 2 weeks ago, when she had a little spotting and and passed what she calls a mucous plug.  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   Alcohol dependence (HCC) 01/15/2020   Opiate abuse, episodic (HCC) 01/15/2020   Homelessness 11/19/2019   Bipolar I disorder (HCC) 08/05/2019   Depression 08/05/2019   Generalized anxiety disorder 08/05/2019   History of substance abuse (HCC) 08/05/2019   BMI 32.0-32.9,adult  06/16/2018   Generalized abdominal pain 06/16/2018   Hypokalemia 06/16/2018   Tobacco abuse 06/16/2018   Low back pain 04/15/2018   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HCC) 04/09/2018   Cocaine abuse, episodic use (HCC) 04/09/2018   Moderate alcohol use disorder (HCC) 04/09/2018   History of cesarean delivery 06/06/2017   Contraception management 06/06/2017   Positive GBS test 06/02/2017   Examination of participant in clinical trial 06/02/2017   Substance abuse (HCC) 04/04/2017   Vaginitis affecting pregnancy in second trimester, antepartum 02/25/2017   Cervical insufficiency during pregnancy, antepartum 12/20/2016   High-risk pregnancy supervision 12/19/2016   Previous child with congenital anomaly, currently pregnant, antepartum 12/19/2016   MDD (major depressive disorder), recurrent episode, mild (HCC) 07/17/2016   PTSD (post-traumatic stress disorder) 07/17/2016    History reviewed. No pertinent surgical history.  OB History     Gravida  4   Para  2   Term  1   Preterm  1   AB  1   Living  2      SAB      IAB      Ectopic      Multiple      Live Births               Home Medications    Prior to Admission medications  Medication Sig Start Date End Date Taking? Authorizing Provider  famotidine (PEPCID) 20 MG tablet Take 1 tablet (20 mg total) by mouth 2 (two) times daily. 12/25/21  Yes Zenia Resides, MD  gentamicin (GARAMYCIN) 0.3 % ophthalmic solution Place 2 drops into the left eye 3 (three) times daily for 5 days. 12/25/21 12/30/21 Yes Zenia Resides, MD  ondansetron (ZOFRAN-ODT) 4 MG disintegrating tablet Take 1 tablet (4 mg total) by mouth every 8 (eight) hours as needed for nausea or vomiting. 12/25/21  Yes Kerria Sapien, Janace Aris, MD  albuterol (VENTOLIN HFA) 108 (90 Base) MCG/ACT inhaler Inhale 1-2 puffs into the lungs every 6 (six) hours as needed for wheezing or shortness of breath. 11/26/21   Mayers, Cari S, PA-C  benztropine (COGENTIN) 1 MG  tablet Take 1 tablet (1 mg total) by mouth 2 (two) times daily. 11/26/21   Mayers, Cari S, PA-C  dicyclomine (BENTYL) 20 MG tablet Take 1 tablet (20 mg total) by mouth every 6 (six) hours as needed. IBS 11/26/21   Mayers, Cari S, PA-C  hydrOXYzine (ATARAX) 25 MG tablet Take 1 tablet (25 mg total) by mouth 3 (three) times daily as needed for anxiety. 11/26/21   Mayers, Cari S, PA-C  OLANZapine (ZYPREXA) 5 MG tablet Take 1 tablet (5 mg total) by mouth at bedtime. 11/26/21   Mayers, Cari S, PA-C  prazosin (MINIPRESS) 2 MG capsule Take 1 capsule (2 mg total) by mouth at bedtime. 11/26/21   Mayers, Cari S, PA-C  risperiDONE (RISPERDAL) 0.5 MG tablet Take 1 tablet (0.5 mg total) by mouth at bedtime. 11/26/21   Mayers, Cari S, PA-C  traZODone (DESYREL) 50 MG tablet Take 1 tablet (50 mg total) by mouth at bedtime as needed for sleep. 11/26/21   Mayers, Cari S, PA-C  valbenazine (INGREZZA) 60 MG capsule Take 1 capsule (60 mg total) by mouth daily. 11/26/21   Mayers, Cari S, PA-C  venlafaxine XR (EFFEXOR XR) 75 MG 24 hr capsule Take 1 capsule (75 mg total) by mouth daily with breakfast. 11/26/21   Mayers, Kasandra Knudsen, PA-C    Family History Family History  Problem Relation Age of Onset   Alcohol abuse Mother    Bipolar disorder Mother    Depression Mother    Alcohol abuse Father    Hyperlipidemia Father    Hypertension Father    Alcohol abuse Maternal Grandmother    Bipolar disorder Maternal Grandmother    Alcohol abuse Maternal Grandfather     Social History Social History   Tobacco Use   Smoking status: Every Day    Packs/day: 3.00    Types: Cigarettes   Smokeless tobacco: Never  Vaping Use   Vaping Use: Never used  Substance Use Topics   Alcohol use: Yes    Alcohol/week: 4.0 standard drinks of alcohol    Types: 3 Cans of beer, 1 Shots of liquor per week    Comment: pt reports she drincks daily     Allergies   Bee pollen, Chlorpromazine, Other, Oxycodone-acetaminophen, Peanut oil, Pineapple,  and Promethazine   Review of Systems Review of Systems  Gastrointestinal:  Positive for diarrhea.  Musculoskeletal:  Positive for back pain.     Physical Exam Triage Vital Signs ED Triage Vitals  Enc Vitals Group     BP 12/25/21 0950 (!) 132/92     Pulse Rate 12/25/21 0950 79     Resp 12/25/21 0950 18     Temp 12/25/21 0950 98.5 F (36.9 C)  Temp src --      SpO2 12/25/21 0950 100 %     Weight --      Height --      Head Circumference --      Peak Flow --      Pain Score 12/25/21 0947 10     Pain Loc --      Pain Edu? --      Excl. in [place]? --    No data found.  Updated Vital Signs BP (!) 132/92   Pulse 79   Temp 98.5 F (36.9 C)   Resp 18   LMP  (LMP Unknown)   SpO2 100%   Visual Acuity Right Eye Distance:   Left Eye Distance:   Bilateral Distance:    Right Eye Near:   Left Eye Near:    Bilateral Near:     Physical Exam Vitals reviewed.  Constitutional:      General: She is not in acute distress.    Appearance: She is not ill-appearing, toxic-appearing or diaphoretic.     Comments: Gives coherent history, but is lying on the exam table, looking like she does not feel good  HENT:     Right Ear: Tympanic membrane and ear canal normal.     Left Ear: Tympanic membrane and ear canal normal.     Nose: Nose normal.     Mouth/Throat:     Mouth: Mucous membranes are moist.     Pharynx: No oropharyngeal exudate or posterior oropharyngeal erythema.  Eyes:     Extraocular Movements: Extraocular movements intact.     Pupils: Pupils are equal, round, and reactive to light.     Comments: Left eye has some injection and a little bit of dried discharge on the lids.  Lids are not edematous at this time.  Cardiovascular:     Rate and Rhythm: Normal rate and regular rhythm.     Heart sounds: No murmur heard. Pulmonary:     Effort: Pulmonary effort is normal. No respiratory distress.     Breath sounds: No stridor. No wheezing, rhonchi or rales.  Abdominal:      General: Bowel sounds are normal. There is no distension.     Palpations: Abdomen is soft. There is no mass.     Tenderness: There is no abdominal tenderness. There is no guarding.  Musculoskeletal:     Cervical back: Neck supple.     Right lower leg: No edema.     Left lower leg: No edema.  Lymphadenopathy:     Cervical: No cervical adenopathy.  Skin:    Capillary Refill: Capillary refill takes less than 2 seconds.     Coloration: Skin is not jaundiced or pale.  Neurological:     General: No focal deficit present.     Mental Status: She is alert and oriented to person, place, and time.  Psychiatric:        Behavior: Behavior normal.      UC Treatments / Results  Labs (all labs ordered are listed, but only abnormal results are displayed) Labs Reviewed  POCT URINALYSIS DIPSTICK, ED / UC - Abnormal; Notable for the following components:      Result Value   Bilirubin Urine SMALL (*)    Ketones, ur TRACE (*)    Hgb urine dipstick SMALL (*)    Protein, ur 100 (*)    Nitrite POSITIVE (*)    All other components within normal limits  URINE CULTURE  SARS CORONAVIRUS 2 (  TAT 6-24 HRS)  COMPREHENSIVE METABOLIC PANEL  CBC  HCG, SERUM, QUALITATIVE  POC URINE PREG, ED  CERVICOVAGINAL ANCILLARY ONLY    EKG   Radiology No results found.  Procedures Procedures (including critical care time)  Medications Ordered in UC Medications - No data to display  Initial Impression / Assessment and Plan / UC Course  I have reviewed the triage vital signs and the nursing notes.  Pertinent labs & imaging results that were available during my care of the patient were reviewed by me and considered in my medical decision making (see chart for details).         I am going to treat with gent for the conjunctivitis.  She is prescribed Zofran for the nausea.  We will do lab draw for CBC, chemistry panel, and serum pregnancy test.  If she worsens anyway she should present to the  emergency room for more urgent evaluation.  I did discuss with her what the black stool could mean that she had yesterday.  Pepcid begun for possible gastritis  We will test for COVID today since she has been ill for 7 days. Final Clinical Impressions(s) / UC Diagnoses   Final diagnoses:  Nausea and vomiting, unspecified vomiting type  Diarrhea, unspecified type  Viral upper respiratory tract infection  Acute conjunctivitis of left eye, unspecified acute conjunctivitis type  Abnormal uterine bleeding     Discharge Instructions      Ondansetron dissolved in the mouth every 8 hours as needed for nausea or vomiting. Clear liquids and bland things to eat.  Take famotidine 20 mg--1 tablet 2 times daily.  This is for stomach acid and acid reflux  Put gentamicin eyedrops in your left eye 3 times daily for 5 days  We have drawn blood for a blood count, kidney and liver function numbers, and a pregnancy test in the blood.  Staff will notify you if anything is positive or significantly abnormal  Tylenol for pain     ED Prescriptions     Medication Sig Dispense Auth. Provider   gentamicin (GARAMYCIN) 0.3 % ophthalmic solution Place 2 drops into the left eye 3 (three) times daily for 5 days. 5 mL Zenia Resides, MD   famotidine (PEPCID) 20 MG tablet Take 1 tablet (20 mg total) by mouth 2 (two) times daily. 30 tablet Amond Speranza, Janace Aris, MD   ondansetron (ZOFRAN-ODT) 4 MG disintegrating tablet Take 1 tablet (4 mg total) by mouth every 8 (eight) hours as needed for nausea or vomiting. 10 tablet Marlinda Mike Janace Aris, MD      PDMP not reviewed this encounter.   Zenia Resides, MD 12/25/21 1034    Zenia Resides, MD 12/25/21 1036

## 2021-12-25 NOTE — ED Triage Notes (Signed)
PT reports she may have passed a mucous plug but does not know if she is pregnant. Pt also has back pain,chills ,swelling to upper ans lower lis with eye drainage. Pt is also having nausea and diarrhea for one week.

## 2021-12-25 NOTE — Discharge Instructions (Addendum)
Ondansetron dissolved in the mouth every 8 hours as needed for nausea or vomiting. Clear liquids and bland things to eat.  Take famotidine 20 mg--1 tablet 2 times daily.  This is for stomach acid and acid reflux  Put gentamicin eyedrops in your left eye 3 times daily for 5 days  We have drawn blood for a blood count, kidney and liver function numbers, and a pregnancy test in the blood.  Staff will notify you if anything is positive or significantly abnormal  Tylenol for pain

## 2021-12-26 LAB — SARS CORONAVIRUS 2 (TAT 6-24 HRS): SARS Coronavirus 2: NEGATIVE

## 2021-12-26 LAB — CERVICOVAGINAL ANCILLARY ONLY
Bacterial Vaginitis (gardnerella): POSITIVE — AB
Candida Glabrata: NEGATIVE
Candida Vaginitis: NEGATIVE
Chlamydia: NEGATIVE
Comment: NEGATIVE
Comment: NEGATIVE
Comment: NEGATIVE
Comment: NEGATIVE
Comment: NEGATIVE
Comment: NORMAL
Neisseria Gonorrhea: NEGATIVE
Trichomonas: NEGATIVE

## 2021-12-26 LAB — URINE CULTURE

## 2021-12-27 ENCOUNTER — Telehealth (HOSPITAL_COMMUNITY): Payer: Self-pay | Admitting: Emergency Medicine

## 2021-12-27 MED ORDER — METRONIDAZOLE 0.75 % VA GEL: 1.0000 | Freq: Every day | VAGINAL | 0 refills | Status: AC

## 2021-12-27 MED ORDER — METRONIDAZOLE 500 MG PO TABS: 500.0000 mg | ORAL_TABLET | Freq: Two times a day (BID) | ORAL | 0 refills | Status: DC

## 2021-12-27 NOTE — Telephone Encounter (Signed)
Patient preferred gel, resending

## 2022-01-07 ENCOUNTER — Telehealth (HOSPITAL_COMMUNITY): Payer: Self-pay | Admitting: Licensed Clinical Social Worker

## 2022-01-07 NOTE — Telephone Encounter (Signed)
01/07/2022 Name: Darlene Gates MRN: [id number] DOB: Nov 30, 1985  Unsuccessful outbound call made today to assist with:   [facility] CD-IOP  Outreach Attempt:  1st Attempt  Spoke with Janett Billow regarding group treatment and appointment scheduled for 10/11 at 2p.   Darol Destine, [facility]

## 2022-01-09 ENCOUNTER — Ambulatory Visit (INDEPENDENT_AMBULATORY_CARE_PROVIDER_SITE_OTHER): Payer: Medicaid Other | Admitting: Licensed Clinical Social Worker

## 2022-01-09 DIAGNOSIS — F1020 Alcohol dependence, uncomplicated: Secondary | ICD-10-CM | POA: Diagnosis not present

## 2022-01-09 DIAGNOSIS — F1910 Other psychoactive substance abuse, uncomplicated: Secondary | ICD-10-CM

## 2022-01-09 DIAGNOSIS — F330 Major depressive disorder, recurrent, mild: Secondary | ICD-10-CM | POA: Diagnosis not present

## 2022-01-09 NOTE — Progress Notes (Signed)
   THERAPIST PROGRESS NOTE  Session Time: 35 minutes  Participation Level: Active  Behavioral Response: Casual and NeatAlertEuthymic  Type of Therapy: Individual Therapy  Treatment Goals addressed: Treatment plan not created yet, initial session  ProgressTowards Goals: Initial  Interventions: Motivational Interviewing  Summary: Darlene Gates is a 36 y.o. female who presents for individual session for CD-IOP group referral. Darlene Gates reports since leaving ED residing in tent with fianc. Reports drinking alcohol of 2 40oz beers and a 16 oz daily or sometimes 2 24 oz beers after 2 40oz. Cocaine 1-2 or sometimes 3 times a month with last use yesterday. Withdrawals: shakes, nausea, vomiting, headaches, fatigue. Asking people for money, case manager [facility] [place] [facility] has been case Freight forwarder for a while and is currently helping with housing resources. Eating meals sometimes and taking a shower sometimes (took one yesterday) but prior to that it has been a month. Reports that she recently found out she is pregnant and is about one to three months pregnant. She reports that she receives Medicaid and food stamp benefits but have not received the food stamp card in the mail yet. She reports that she has a cell phone. Attends church three days a week and is able to get meals there also. She scores 20 on PHQ-9 today. She denies SI/HI, AVH or psychosis. Darlene Gates agreed to begin group on 01/16/2022.  Suicidal/Homicidal: Nowithout intent/plan  Therapist Response: Motivational interviewing skills. Assessed recent substance use and withdrawal symptoms, assessed for issues of dangerousness and needed supports and barriers to treatment. Gave opportunity to ask questions about the group. Made recommendation to medication management for depressive symptoms. She is working with [facility] case Freight forwarder for pre-natal care and housing supports.  Plan: Return again in 1 week.  Diagnosis: Alcohol use disorder, severe, Major  Depressive Disorder, severe  Collaboration of Care: Medication Management AEB recommendation set, appt to be scheduled.  Patient/Guardian was advised Release of Information must be obtained prior to any record release in order to collaborate their care with an outside provider. Patient/Guardian was advised if they have not already done so to contact the registration department to sign all necessary forms in order for [place] to release information regarding their care.   Consent: Patient/Guardian gives verbal consent for treatment and assignment of benefits for services provided during this visit. Patient/Guardian expressed understanding and agreed to proceed.   Allyson Sabal, [facility] 01/09/2022

## 2022-01-16 ENCOUNTER — Ambulatory Visit (INDEPENDENT_AMBULATORY_CARE_PROVIDER_SITE_OTHER): Payer: Medicaid Other | Admitting: Licensed Clinical Social Worker

## 2022-01-16 DIAGNOSIS — F1910 Other psychoactive substance abuse, uncomplicated: Secondary | ICD-10-CM | POA: Diagnosis not present

## 2022-01-16 DIAGNOSIS — F314 Bipolar disorder, current episode depressed, severe, without psychotic features: Secondary | ICD-10-CM

## 2022-01-18 ENCOUNTER — Ambulatory Visit (INDEPENDENT_AMBULATORY_CARE_PROVIDER_SITE_OTHER): Payer: Medicaid Other | Admitting: Licensed Clinical Social Worker

## 2022-01-18 DIAGNOSIS — F1910 Other psychoactive substance abuse, uncomplicated: Secondary | ICD-10-CM | POA: Diagnosis not present

## 2022-01-18 DIAGNOSIS — F319 Bipolar disorder, unspecified: Secondary | ICD-10-CM

## 2022-01-18 NOTE — Group Note (Signed)
Group Topic: Substance Abuse Treatment  Group Date: 01/16/2022 Start Time:  9:00 AM End Time: 12:00 PM Facilitators: Allyson Sabal [facility]  Department: [facility]   Name: Helaina Stefano Date of Birth: 1985/07/17  MR: [id number]    Level of Participation: active Quality of Participation: attentive and cooperative Interactions with others: gave feedback Mood/Affect: appropriate Triggers (if applicable): Having money, homelessness Cognition: coherent/clear Progress: Minimal Response: Today is Darlene Gates's first group attendance. She arrives on time and is oriented x5. She reports that she has had alcohol on 10/17 and cocaine use "a couple of days ago". She reports that she did not eat breakfast today. She reports that she has been talking to her daughter and her son and has been re-building their relationships. She reports that her case manager is still helping her work to find housing as she is still homeless. She also reports that she is pregnant but it is unknown how far along she is in her pregnancy, and she continued to have alcohol and cocaine use. 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 Chanell denies SI/HI, AVH or psychosis. Plan: patient will be encouraged to abstain from alcohol and cocaine use and be re-assessed for possible higher level of care.  Patients Problems:  Patient Active Problem List   Diagnosis Date Noted   Asthma without status asthmaticus 05/13/2021   HSV (herpes simplex virus) infection 05/13/2021   BV (bacterial vaginosis) 04/24/2021   Cervical cancer screening 04/08/2020   High risk sexual behavior 03/15/2020   Substance induced mood disorder ([place]) 01/15/2020   Bipolar 1 disorder, depressed, severe ([place]) 01/15/2020   Amphetamine abuse ([place]) 01/15/2020   Alcohol dependence ([place]) 01/15/2020   Opiate  abuse, episodic ([place]) 01/15/2020   Homelessness 11/19/2019   Bipolar I disorder ([place]) 08/05/2019   Depression 08/05/2019   Generalized anxiety disorder 08/05/2019   History of substance abuse ([place]) 08/05/2019   BMI 32.0-32.9,adult 06/16/2018   Generalized abdominal pain 06/16/2018   Hypokalemia 06/16/2018   Tobacco abuse 06/16/2018   Low back pain 04/15/2018   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[place]) 04/09/2018   Cocaine abuse, episodic use ([place]) 04/09/2018   Moderate alcohol use disorder ([place]) 04/09/2018   History of cesarean delivery 06/06/2017   Contraception management 06/06/2017   Positive GBS test 06/02/2017   Examination of participant in clinical trial 06/02/2017   Substance abuse ([place]) 04/04/2017   Vaginitis affecting pregnancy in second trimester, antepartum 02/25/2017   Cervical insufficiency during pregnancy, antepartum 12/20/2016   High-risk pregnancy supervision 12/19/2016   Previous child with congenital anomaly, currently pregnant, antepartum 12/19/2016   MDD (major depressive disorder), recurrent episode, mild ([place]) 07/17/2016   PTSD (post-traumatic stress disorder) 07/17/2016     UDS collected: No   AA/NA attended?: No  Sponsor?: No

## 2022-01-18 NOTE — Group Note (Addendum)
Group Topic: Relapse and Recovery  Group Date: 01/18/2022 Start Time:  9:00 AM End Time: 12:00 PM Facilitators: Allyson Sabal, [facility]  Department: [facility]  Group Topic: 12-Step and Recovery Group Date: 01/18/2022 Start Time: 0900 End Time: 1200 Facilitators: Darol Destine  Department: CD-IOP  @GROUPNOTE @ Name: No patient name on file. Date of Birth: There is no date of birth on file.  MR: No patient ID available    Level of Participation: active Quality of Participation: attentive and cooperative Interactions with others:  Mood/Affect: appropriate Triggers (if applicable): People, places and things Cognition: coherent/clear and goal directed Progress: Minimal Response: Client is on time and oriented x5 to group. Client is engaged during group discussion and provides feedback to peers and shares in group topic of 12-Step and Recovery support group programs. Plan: patient will be encouraged to abstain from substance use and begin working recovery program. Identify and work to resolve any barriers to sobriety and treatment.  Patients Problems:  @PROB @   Family Program: Family present? No   Name of family member(s): 0  UDS collected: No Results: N/A  AA/NA attended?: No  Sponsor?: No  Name: Tai Chiao Date of Birth: 03-01-1986  MR: [id number]    Level of Participation: when cued Quality of Participation: drowsy, offered feedback, and quiet Interactions with others: minimal and gave feedback Mood/Affect: blunted, closed / guarded, flat, irritable, and labile Triggers (if applicable): Environment and partner Cognition: not focused Progress: Gaining insight Response: Akiera is on time and oriented x5 today. She is not as engaged in group today and Clinician is able to speak with her afterwards to discuss what is going on for her today. She reports that she is feeling sleepy and feeling tired. She reports that she still has not  sought pre-natal care yet. She is engaged when prompted and had to asked to wake up during group time. Emree reports that she has drank alcohol since last group "to keep withdrawals down" and states that she feels shaky and sick as the only withdrawals she is experiencing.  Plan: patient will be encouraged to abstain from substance use and continue to work with case manager for abstaining housing and pre-natal care provider.  Patients Problems:  Patient Active Problem List   Diagnosis Date Noted   Asthma without status asthmaticus 05/13/2021   HSV (herpes simplex virus) infection 05/13/2021   BV (bacterial vaginosis) 04/24/2021   Cervical cancer screening 04/08/2020   High risk sexual behavior 03/15/2020   Substance induced mood disorder ([place]) 01/15/2020   Bipolar 1 disorder, depressed, severe ([place]) 01/15/2020   Amphetamine abuse ([place]) 01/15/2020   Alcohol dependence ([place]) 01/15/2020   Opiate abuse, episodic ([place]) 01/15/2020   Homelessness 11/19/2019   Bipolar I disorder ([place]) 08/05/2019   Depression 08/05/2019   Generalized anxiety disorder 08/05/2019   History of substance abuse ([place]) 08/05/2019   BMI 32.0-32.9,adult 06/16/2018   Generalized abdominal pain 06/16/2018   Hypokalemia 06/16/2018   Tobacco abuse 06/16/2018   Low back pain 04/15/2018   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[place]) 04/09/2018   Cocaine abuse, episodic use ([place]) 04/09/2018   Moderate alcohol use disorder ([place]) 04/09/2018   History of cesarean delivery 06/06/2017   Contraception management 06/06/2017   Positive GBS test 06/02/2017   Examination of participant in clinical trial 06/02/2017   Substance abuse ([place]) 04/04/2017   Vaginitis affecting pregnancy in second trimester, antepartum 02/25/2017   Cervical insufficiency during pregnancy, antepartum 12/20/2016   High-risk pregnancy supervision  12/19/2016   Previous child with congenital anomaly, currently pregnant, antepartum 12/19/2016   MDD (major  depressive disorder), recurrent episode, mild ([place]) 07/17/2016   PTSD (post-traumatic stress disorder) 07/17/2016      UDS collected: No   AA/NA attended?:  Does not have sponsor  Sponsor?: No

## 2022-01-21 ENCOUNTER — Ambulatory Visit (HOSPITAL_COMMUNITY): Payer: Medicaid Other

## 2022-01-23 ENCOUNTER — Emergency Department (HOSPITAL_COMMUNITY): Payer: Commercial Managed Care - HMO

## 2022-01-23 ENCOUNTER — Other Ambulatory Visit: Payer: Self-pay

## 2022-01-23 ENCOUNTER — Ambulatory Visit (INDEPENDENT_AMBULATORY_CARE_PROVIDER_SITE_OTHER): Payer: Medicaid Other | Admitting: Licensed Clinical Social Worker

## 2022-01-23 ENCOUNTER — Emergency Department (HOSPITAL_COMMUNITY)
Admission: EM | Admit: 2022-01-23 | Discharge: 2022-01-23 | Disposition: A | Discharge status: Home / Self Care | Payer: Commercial Managed Care - HMO | Attending: Emergency Medicine | Admitting: Emergency Medicine

## 2022-01-23 DIAGNOSIS — S0081XA Abrasion of other part of head, initial encounter: Secondary | ICD-10-CM | POA: Insufficient documentation

## 2022-01-23 DIAGNOSIS — F1910 Other psychoactive substance abuse, uncomplicated: Secondary | ICD-10-CM

## 2022-01-23 DIAGNOSIS — Z91010 Allergy to peanuts: Secondary | ICD-10-CM | POA: Insufficient documentation

## 2022-01-23 DIAGNOSIS — S62666A Nondisplaced fracture of distal phalanx of right little finger, initial encounter for closed fracture: Secondary | ICD-10-CM | POA: Diagnosis not present

## 2022-01-23 DIAGNOSIS — F319 Bipolar disorder, unspecified: Secondary | ICD-10-CM

## 2022-01-23 DIAGNOSIS — T148XXA Other injury of unspecified body region, initial encounter: Secondary | ICD-10-CM | POA: Diagnosis not present

## 2022-01-23 DIAGNOSIS — Z23 Encounter for immunization: Secondary | ICD-10-CM | POA: Diagnosis not present

## 2022-01-23 DIAGNOSIS — M79644 Pain in right finger(s): Secondary | ICD-10-CM | POA: Insufficient documentation

## 2022-01-23 DIAGNOSIS — T07XXXA Unspecified multiple injuries, initial encounter: Secondary | ICD-10-CM

## 2022-01-23 DIAGNOSIS — M542 Cervicalgia: Secondary | ICD-10-CM | POA: Insufficient documentation

## 2022-01-23 DIAGNOSIS — S6991XA Unspecified injury of right wrist, hand and finger(s), initial encounter: Secondary | ICD-10-CM | POA: Diagnosis present

## 2022-01-23 LAB — URINALYSIS, ROUTINE W REFLEX MICROSCOPIC
Bilirubin Urine: NEGATIVE
Glucose, UA: NEGATIVE mg/dL
Ketones, ur: 5 mg/dL — AB
Nitrite: NEGATIVE
Protein, ur: NEGATIVE mg/dL
Specific Gravity, Urine: 1.025 (ref 1.005–1.030)
pH: 5 (ref 5.0–8.0)

## 2022-01-23 LAB — HIV ANTIBODY (ROUTINE TESTING W REFLEX): HIV Screen 4th Generation wRfx: NONREACTIVE

## 2022-01-23 LAB — PREGNANCY, URINE: Preg Test, Ur: NEGATIVE

## 2022-01-23 MED ORDER — HYDROCODONE-ACETAMINOPHEN 5-325 MG PO TABS
1.0000 | ORAL_TABLET | Freq: Once | ORAL | Status: AC
  Administered 2022-01-23: 1 via ORAL
  Filled 2022-01-23: qty 1

## 2022-01-23 MED ORDER — TETANUS-DIPHTH-ACELL PERTUSSIS 5-2.5-18.5 LF-MCG/0.5 IM SUSY
0.5000 mL | PREFILLED_SYRINGE | Freq: Once | INTRAMUSCULAR | Status: AC
  Administered 2022-01-23: 0.5 mL via INTRAMUSCULAR
  Filled 2022-01-23: qty 0.5

## 2022-01-23 MED ORDER — CELECOXIB 200 MG PO CAPS: 200.0000 mg | ORAL_CAPSULE | Freq: Two times a day (BID) | ORAL | 0 refills | Status: DC

## 2022-01-23 MED ORDER — CYCLOBENZAPRINE HCL 10 MG PO TABS: 5.0000 mg | ORAL_TABLET | Freq: Two times a day (BID) | ORAL | 0 refills | Status: DC | PRN

## 2022-01-23 MED ORDER — KETOROLAC TROMETHAMINE 60 MG/2ML IM SOLN
60.0000 mg | Freq: Once | INTRAMUSCULAR | Status: AC
  Administered 2022-01-23: 60 mg via INTRAMUSCULAR
  Filled 2022-01-23: qty 2
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
## 2022-01-23 NOTE — Discharge Instructions (Signed)
Your little finger has a small break in the very end of it.  Use the splint for stabilization and protection. Otherwise the remainder of your work-up was negative.  You have some muscle spasms. Get help right away for new or worsening syptoms.
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
## 2022-01-24 LAB — GC/CHLAMYDIA PROBE AMP (~~LOC~~) NOT AT ARMC
Chlamydia: NEGATIVE
Comment: NEGATIVE
Comment: NORMAL
Neisseria Gonorrhea: NEGATIVE

## 2022-01-24 LAB — RPR: RPR Ser Ql: NONREACTIVE
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
## 2022-01-25 ENCOUNTER — Encounter (HOSPITAL_COMMUNITY): Payer: Self-pay | Admitting: Psychiatry

## 2022-01-25 ENCOUNTER — Ambulatory Visit (INDEPENDENT_AMBULATORY_CARE_PROVIDER_SITE_OTHER): Payer: Medicaid Other | Admitting: Licensed Clinical Social Worker

## 2022-01-25 ENCOUNTER — Ambulatory Visit (INDEPENDENT_AMBULATORY_CARE_PROVIDER_SITE_OTHER): Payer: Medicaid Other | Admitting: Psychiatry

## 2022-01-25 DIAGNOSIS — F1020 Alcohol dependence, uncomplicated: Secondary | ICD-10-CM

## 2022-01-25 DIAGNOSIS — F1090 Alcohol use, unspecified, uncomplicated: Secondary | ICD-10-CM | POA: Diagnosis not present

## 2022-01-25 DIAGNOSIS — F1490 Cocaine use, unspecified, uncomplicated: Secondary | ICD-10-CM | POA: Diagnosis not present

## 2022-01-25 DIAGNOSIS — G2401 Drug induced subacute dyskinesia: Secondary | ICD-10-CM

## 2022-01-25 DIAGNOSIS — F39 Unspecified mood [affective] disorder: Secondary | ICD-10-CM | POA: Diagnosis not present

## 2022-01-25 DIAGNOSIS — F411 Generalized anxiety disorder: Secondary | ICD-10-CM

## 2022-01-25 DIAGNOSIS — F1410 Cocaine abuse, uncomplicated: Secondary | ICD-10-CM

## 2022-01-25 DIAGNOSIS — F29 Unspecified psychosis not due to a substance or known physiological condition: Secondary | ICD-10-CM | POA: Insufficient documentation

## 2022-01-25 DIAGNOSIS — F4310 Post-traumatic stress disorder, unspecified: Secondary | ICD-10-CM

## 2022-01-25 DIAGNOSIS — F330 Major depressive disorder, recurrent, mild: Secondary | ICD-10-CM

## 2022-01-25 MED ORDER — VALBENAZINE TOSYLATE 40 MG PO CAPS: 40.0000 mg | ORAL_CAPSULE | Freq: Every day | ORAL | 2 refills | Status: AC

## 2022-01-25 MED ORDER — NALTREXONE HCL 50 MG PO TABS: 50.0000 mg | ORAL_TABLET | Freq: Every day | ORAL | 2 refills | Status: AC

## 2022-01-25 MED ORDER — ARIPIPRAZOLE 5 MG PO TABS: 5.0000 mg | ORAL_TABLET | Freq: Every day | ORAL | 2 refills | Status: DC

## 2022-01-25 MED ORDER — VENLAFAXINE HCL ER 75 MG PO CP24: 75.0000 mg | ORAL_CAPSULE | Freq: Every day | ORAL | 2 refills | Status: DC

## 2022-01-25 MED ORDER — GABAPENTIN 100 MG PO CAPS: 100.0000 mg | ORAL_CAPSULE | Freq: Three times a day (TID) | ORAL | 2 refills | Status: DC | PRN
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
## 2022-01-25 NOTE — Patient Instructions (Addendum)
Thank you for attending your appointment today.  -- RESTART Effexor 75 mg daily -- START Abilify 5 mg daily -- START naltrexone 50 mg daily -- START gabapentin 100 mg up to three times a day as needed for anxiety/alcohol use cravings -- RESTART Ingrezza at 40 mg daily -- Continue participation in CD-IOP program -- Reach out to [facility] to establish with primary care   Please do not make any changes to medications without first discussing with your provider. If you are experiencing a psychiatric emergency, please call 911 or present to your nearest emergency department. Additional crisis, medication management, and therapy resources are included below.  [facility]  [address] [place],  [address] [phone] WALK-IN URGENT CARE 24/7 FOR ANYONE [address] [place], [place] Fax: [phone] [email or web address] *Interpreters available *Accepts all insurance and uninsured for Urgent Care needs *Accepts Medicaid and uninsured for outpatient treatment (below)      ONLY FOR [facility]  Below:    Outpatient New Patient Assessment/Therapy Walk-ins:        Monday -Thursday 8am until slots are full.        Every Friday 1pm-4pm  (first come, first served)                   New Patient Psychiatry/Medication Management        Monday-Friday 8am-11am (first come, first served)               For all walk-ins we ask that you arrive by 7:15am, because patients will be seen in the order of arrival.

## 2022-01-27 NOTE — Group Note (Signed)
Group Topic: Relapse Prevention  Group Date: 01/25/2022 Start Time:  9:00 AM End Time: 12:00 PM Facilitators: Allyson Sabal, [facility]  Department: [facility]  Group Topic: Relapse Prevention  Group Date: 01/25/2022 Start Time: 0900 End Time: 1200 Facilitators: Darol Destine  Department: [facility] CD-IOP  @GROUPNOTE @ Name: No patient name on file. Date of Birth: There is no date of birth on file.  MR: No patient ID available    Level of Participation: active Quality of Participation: attentive and cooperative Interactions with others: gave feedback Mood/Affect: appropriate and positive Triggers (if applicable): None identified Cognition: coherent/clear and insightful Progress: Gaining insight Response: Client is oriented x5 during session. Client is assessed for issues of dangerousness and mental status evaluated. Group topic discussed is Relapse Prevention and client worked on creating their own Relapse Prevention Plan identifying triggers, coping skills and supports. Client denies SI/HI, AVH or psychosis.  Plan: patient will be encouraged to attend group and 12-step meeting and abstain from substance use.  Patients Problems:  @PROB @   Family Program: Family present? 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   Opiate abuse, episodic ([place]) 01/15/2020   Homelessness  11/19/2019   Generalized anxiety disorder 08/05/2019   History of substance abuse ([place]) 08/05/2019   BMI 32.0-32.9,adult 06/16/2018   Generalized abdominal pain 06/16/2018   Hypokalemia 06/16/2018   Tobacco abuse 06/16/2018   Low back pain 04/15/2018   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[place]) 04/09/2018   Cocaine abuse, episodic use ([place]) 04/09/2018   Alcohol use disorder, severe, dependence ([place]) 04/09/2018   History of cesarean delivery 06/06/2017   Contraception management 06/06/2017   Positive GBS test 06/02/2017   Examination of participant in clinical trial 06/02/2017   Substance abuse ([place]) 04/04/2017   Vaginitis affecting pregnancy in second trimester, antepartum 02/25/2017   Cervical insufficiency during pregnancy, antepartum 12/20/2016   Previous child with congenital anomaly, currently pregnant, antepartum 12/19/2016   MDD (major depressive disorder), recurrent episode, mild ([place]) 07/17/2016   PTSD (post-traumatic stress disorder) 07/17/2016

## 2022-01-28 ENCOUNTER — Ambulatory Visit (INDEPENDENT_AMBULATORY_CARE_PROVIDER_SITE_OTHER): Payer: Medicaid Other | Admitting: Licensed Clinical Social Worker

## 2022-01-28 DIAGNOSIS — F1020 Alcohol dependence, uncomplicated: Secondary | ICD-10-CM | POA: Diagnosis not present

## 2022-01-28 DIAGNOSIS — F319 Bipolar disorder, unspecified: Secondary | ICD-10-CM

## 2022-01-28 NOTE — Group Note (Signed)
Group Topic: Substance Abuse Treatment  Group Date: 01/28/2022 Start Time:  9:00 AM End Time: 12:00 PM Facilitators: Allyson Sabal, [facility]  Department: [facility]  Group Topic: Early Recovery Skills Group Date: 01/28/2022 Start Time: 0900 End Time: 19 Facilitators: Darol Destine  Department: [facility] CD-IOP  Level of Participation: active Quality of Participation: attentive and cooperative Interactions with others: gave feedback Mood/Affect: appropriate and positive Triggers (if applicable): Client does not identify any triggers for use presently. Cognition: goal directed Progress: Moderate Response: Client is alert and oriented x5 in group today. Client participates in group discussion and provides feedback to others. Plan: patient will be encouraged to abstain from substance and continue to build recovery foundation by attending 12-step meetings.  Patients Problems:  @PROB @   Family Program: Family present? No   Name of family member(s): 0  UDS collected: No Results: negative  AA/NA attended?: No  Sponsor?: No  Name: Darlene Gates Date of Birth: [birth year]  MR: [id number]    Level of Participation: minimal, when cued, withdrawn Quality of Participation: offered feedback, pushed limits, and withdrawn Interactions with others: gave feedback Mood/Affect: flat, labile, and sleeping and had to be woken up. Triggers (if applicable): Denies Cognition: not focused Progress: Minimal Response: [name] arrived to group 30 minutes late today. She is oriented x5. She is observed sleeping in group and had to be engaged by Clinician to join in group discussion. She reports that her weekend was not good and she did not want to talk about it. She reports that she has not drank any alcohol in 2 days and denies withdrawals. Plan: patient will be encouraged to abstain from substances and alcohol and arrive on time for group and remain awake and alert for  the duration.  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   Opiate abuse, episodic ([place]) 01/15/2020   Homelessness 11/19/2019   Generalized anxiety disorder 08/05/2019   History of substance abuse ([place]) 08/05/2019   BMI 32.0-32.9,adult 06/16/2018   Generalized abdominal pain 06/16/2018   Hypokalemia 06/16/2018   Tobacco abuse 06/16/2018   Low back pain 04/15/2018   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[place]) 04/09/2018   Cocaine abuse, episodic use ([place]) 04/09/2018   Alcohol use disorder, severe, dependence ([place]) 04/09/2018   History of cesarean delivery 06/06/2017   Contraception management 06/06/2017   Positive GBS test 06/02/2017   Examination of participant in clinical trial 06/02/2017   Substance abuse ([place]) 04/04/2017   Vaginitis affecting pregnancy in second trimester, antepartum 02/25/2017   Cervical insufficiency during pregnancy, antepartum 12/20/2016   Previous child with congenital anomaly, currently pregnant, antepartum 12/19/2016   MDD (major depressive disorder), recurrent episode, mild ([place]) 07/17/2016   PTSD (post-traumatic stress disorder) 07/17/2016     Family Program: Family present? No   Name of family member(s): 0  UDS collected: No   AA/NA attended?: No  Sponsor?: No

## 2022-01-29 ENCOUNTER — Telehealth (HOSPITAL_COMMUNITY): Payer: Self-pay | Admitting: *Deleted

## 2022-01-29 ENCOUNTER — Other Ambulatory Visit: Payer: Self-pay | Admitting: Oral Surgery

## 2022-01-29 DIAGNOSIS — R220 Localized swelling, mass and lump, head: Secondary | ICD-10-CM

## 2022-01-29 NOTE — Telephone Encounter (Signed)
Prior authorization obtained via covermymeds for her [place] and pharmacy notified.

## 2022-01-29 NOTE — Progress Notes (Unsigned)
  Patient referred by DDS for extraction teeth #6, 8  CC: No  pain.  Past Medical History:  Bipolar disease, Anxiety, Substance Abuse, Alcoholism, High Blood Pressure, Asthma, Bruise Easily, Smoker, Chronic Fatigue Syndrome, Mental Health problems, Tardive Dyskinesia, Dental anxiety   Medications: Albuterol, Cogentin, Bentyl, Pepcid, Hydroxyzine, Zyprexa, Zofran, Minipress, Risperdal, trazadone, Effexor, Ingrezza    Allergies:     Thorazine, Phenergan, Chlorpromazine, Oxycodone, Promethazine    Surgeries:   Oral Surgery, Leg surgery         Social History       Smoking: 2-3 ppd           Alcohol: in tx at [facility] Drug use:  yes                           Exam: Tooth # 8 fractured mid crown. Tooth # 6 next to tooth #8 in location of #7. Tooth # 6 rotated. Tooth #7 in #6 location.  No purulence, edema, fluctuance, trismus. Oral cancer screening negative. Pharynx clear. No lymphadenopathy.  Panorex:Tooth # 8 fractured mid crown. Tooth # 6 next to tooth #8 in location of #7. Multiple 2-67mm circular radio-opaque areas  in 2 cm x 2 cm area right mandible.  Assessment:  Non-restorable teeth # 6, 8.  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            Plan: Extraction Teeth # 6, 8.   Local/nitrous. CT maxillofacial to r/o mandibular mass                 Rx: none              Risks and complications explained. Questions answered.   Gae Bon, DMD

## 2022-01-30 ENCOUNTER — Ambulatory Visit (HOSPITAL_COMMUNITY): Payer: Medicaid Other | Admitting: Licensed Clinical Social Worker

## 2022-01-30 DIAGNOSIS — F1020 Alcohol dependence, uncomplicated: Secondary | ICD-10-CM

## 2022-02-01 ENCOUNTER — Ambulatory Visit (INDEPENDENT_AMBULATORY_CARE_PROVIDER_SITE_OTHER): Payer: Medicaid Other | Admitting: Licensed Clinical Social Worker

## 2022-02-01 DIAGNOSIS — F319 Bipolar disorder, unspecified: Secondary | ICD-10-CM

## 2022-02-01 DIAGNOSIS — F1020 Alcohol dependence, uncomplicated: Secondary | ICD-10-CM | POA: Diagnosis not present

## 2022-02-04 ENCOUNTER — Ambulatory Visit (HOSPITAL_COMMUNITY): Payer: Medicaid Other | Admitting: Licensed Clinical Social Worker

## 2022-02-04 DIAGNOSIS — F1020 Alcohol dependence, uncomplicated: Secondary | ICD-10-CM

## 2022-02-04 NOTE — Group Note (Signed)
Group Topic: Relapse Prevention  Group Date: 02/01/2022 Start Time:  9:00 AM End Time: 12:00 PM Facilitators: Allyson Sabal, [facility]  Department: [facility]  Group Topic: Relapse Prevention and Self-Care Group Date: 02/01/2022 Start Time: 0900 End Time: 1200 Facilitators: Darol Destine  Department: [facility] CD-IOP  Name: Darlene Gates Date of Birth: [birth year]  MR: [id number]    Level of Participation: minimal Quality of Participation: withdrawn Interactions with others: Participated when prompted in group discussion Mood/Affect: appropriate Triggers (if applicable): Santos reports that she has not drank alcohol in two days.  Cognition: coherent/clear and not focused Progress: Moderate Response: Zailey reports that she had a dental procedure and missed last group. She reports that she will be getting new teeth when she is called. She maintains no alcohol or substance use. She continues to fall asleep in group and had to be engaged by Clinician to participate in group discussion. She denies SI/HI, AVH or psychosis. Plan: patient will be encouraged to abstain from alcohol and substance use and attend 12-step meeting.  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   Opiate abuse, episodic ([place]) 01/15/2020   Homelessness 11/19/2019   Generalized anxiety disorder 08/05/2019   History of substance abuse ([place]) 08/05/2019   BMI 32.0-32.9,adult 06/16/2018   Generalized abdominal pain 06/16/2018   Hypokalemia 06/16/2018   Tobacco abuse  06/16/2018   Low back pain 04/15/2018   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[place]) 04/09/2018   Cocaine abuse, episodic use ([place]) 04/09/2018   Alcohol use disorder, severe, dependence ([place]) 04/09/2018   History of cesarean delivery 06/06/2017   Contraception management 06/06/2017   Positive GBS test 06/02/2017   Examination of participant in clinical trial 06/02/2017   Substance abuse ([place]) 04/04/2017   Vaginitis affecting pregnancy in second trimester, antepartum 02/25/2017   Cervical insufficiency during pregnancy, antepartum 12/20/2016   Previous child with congenital anomaly, currently pregnant, antepartum 12/19/2016   MDD (major depressive disorder), recurrent episode, mild ([place]) 07/17/2016   PTSD (post-traumatic stress disorder) 07/17/2016     Family Program: Family present? No   Name of family member(s): 0  UDS collected: No   AA/NA attended?: No  Sponsor?: No

## 2022-02-04 NOTE — Group Note (Signed)
Group Topic: Substance Abuse Treatment  Group Date: 01/30/2022 Start Time:  9:00 AM End Time: 12:00 PM Facilitators: Allyson Sabal, [facility]  Department: [facility]  Group Topic: Stages of Recovery Group Date: 01/30/2022 Start Time: 0900 End Time: 1200 Facilitators: Darol Destine  Department: [facility] CD-IOP  Name: Darlene Gates Date of Birth: 1985/06/17  MR: [id number]    Level of Participation: [name] was absent from group today due to recent dental procedure. 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   Opiate abuse, episodic ([place]) 01/15/2020   Homelessness 11/19/2019   Generalized anxiety disorder 08/05/2019   History of substance abuse ([place]) 08/05/2019   BMI 32.0-32.9,adult 06/16/2018   Generalized abdominal pain 06/16/2018   Hypokalemia 06/16/2018   Tobacco abuse 06/16/2018   Low back pain 04/15/2018   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[place]) 04/09/2018   Cocaine abuse, episodic use ([place]) 04/09/2018   Alcohol use disorder, severe, dependence ([place]) 04/09/2018   History of cesarean delivery 06/06/2017   Contraception management 06/06/2017   Positive GBS test 06/02/2017   Examination of participant in clinical trial 06/02/2017   Substance abuse ([place]) 04/04/2017   Vaginitis affecting pregnancy in second trimester, antepartum 02/25/2017   Cervical insufficiency during pregnancy, antepartum 12/20/2016   Previous child with congenital anomaly, currently pregnant, antepartum 12/19/2016   MDD (major depressive  disorder), recurrent episode, mild ([place]) 07/17/2016   PTSD (post-traumatic stress disorder) 07/17/2016

## 2022-02-04 NOTE — Group Note (Signed)
Group Topic: Core Beliefs  Group Date: 02/04/2022 Start Time:  9:00 AM End Time: 12:00 PM Facilitators: Allyson Sabal, [facility]  Department: [facility]  Group Topic: Core Beliefs and Self Esteem in Early Recovery Group Date: 02/04/2022 Start Time: 0900 End Time: 1200 Facilitators: Darol Destine Department: [facility] CD-IOP  Name: Darlene Gates Date of Birth: 1985-12-19  MR: [id number]    Level of Participation: [name] was not in group today. She called to cancel.  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   Opiate abuse, episodic ([place]) 01/15/2020   Homelessness 11/19/2019   Generalized anxiety disorder 08/05/2019   History of substance abuse ([place]) 08/05/2019   BMI 32.0-32.9,adult 06/16/2018   Generalized abdominal pain 06/16/2018   Hypokalemia 06/16/2018   Tobacco abuse 06/16/2018   Low back pain 04/15/2018   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[place]) 04/09/2018   Cocaine abuse, episodic use ([place]) 04/09/2018   Alcohol use disorder, severe, dependence ([place]) 04/09/2018   History of cesarean delivery 06/06/2017   Contraception management 06/06/2017   Positive GBS test 06/02/2017   Examination of participant in clinical trial 06/02/2017   Substance abuse ([place]) 04/04/2017   Vaginitis affecting pregnancy in second trimester, antepartum 02/25/2017   Cervical insufficiency during pregnancy, antepartum 12/20/2016   Previous child with congenital anomaly, currently pregnant, antepartum 12/19/2016   MDD (major depressive disorder), recurrent  episode, mild ([place]) 07/17/2016   PTSD (post-traumatic stress disorder) 07/17/2016

## 2022-02-06 ENCOUNTER — Ambulatory Visit (INDEPENDENT_AMBULATORY_CARE_PROVIDER_SITE_OTHER): Payer: Medicaid Other | Admitting: Licensed Clinical Social Worker

## 2022-02-06 DIAGNOSIS — F1020 Alcohol dependence, uncomplicated: Secondary | ICD-10-CM

## 2022-02-06 DIAGNOSIS — F319 Bipolar disorder, unspecified: Secondary | ICD-10-CM

## 2022-02-06 DIAGNOSIS — F1910 Other psychoactive substance abuse, uncomplicated: Secondary | ICD-10-CM

## 2022-02-08 ENCOUNTER — Ambulatory Visit (INDEPENDENT_AMBULATORY_CARE_PROVIDER_SITE_OTHER): Payer: Medicaid Other | Admitting: Licensed Clinical Social Worker

## 2022-02-08 DIAGNOSIS — F1020 Alcohol dependence, uncomplicated: Secondary | ICD-10-CM

## 2022-02-08 DIAGNOSIS — F319 Bipolar disorder, unspecified: Secondary | ICD-10-CM

## 2022-02-11 ENCOUNTER — Ambulatory Visit (HOSPITAL_COMMUNITY): Payer: Medicaid Other

## 2022-02-11 NOTE — Group Note (Signed)
Group Topic: Substance Abuse Treatment  Group Date: 02/06/2022 Start Time:  9:00 AM End Time: 12:00 PM Facilitators: Cheri Fowler, [facility]  Department: [facility]  Group Topic: @GROUPTOPIC @  Group Date: @GROUPDATE @ Start Time: @GROUPSTARTTIME @ End Time: @GROUPENDTIME @ Facilitators: @GROUPFACIL @  Department: @GROUPDEP @  @GROUPNOTE @ Name: No patient name on file. Date of Birth: There is no date of birth on file.  MR: No patient ID available    Level of Participation: active Quality of Participation: cooperative Interactions with others: Gave feedback Mood/Affect: appropriate Triggers (if applicable): None identified Cognition: coherent/clear Progress: Gaining insight Response: Alert and oriented. Discussed progress since last treatment group and discussed any stressors and setback as they came up in group. Participated in group discussion of Stages of Change and completed activity today. Denies SI/HI, AVH or psychosis. Plan: patient will be encouraged to continue to abstain from use and attend treatment groups, 12-step recovery meetings  Patients Problems:  @PROB @   Family Program: Family present? No   Name of family member(s): 0  UDS collected:  Results pending    AA/NA attended?: No  Sponsor?: No  Name: Darlene Gates Date of Birth: 1985-06-02  MR:    Patients Problems:  Patient Active Problem List   Diagnosis Date Noted   Unspecified mood (affective) disorder (HCC) 01/25/2022   Psychosis (HCC) 01/25/2022   Asthma without status asthmaticus 05/13/2021   HSV (herpes simplex virus) infection 05/13/2021   BV (bacterial vaginosis) 04/24/2021   Cervical cancer screening 04/08/2020   High risk sexual behavior 03/15/2020   Substance induced mood disorder (HCC) 01/15/2020   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   Opiate abuse, episodic (HCC) 01/15/2020   Homelessness 11/19/2019   Generalized anxiety disorder 08/05/2019    History of substance abuse (HCC) 08/05/2019   BMI 32.0-32.9,adult 06/16/2018   Generalized abdominal pain 06/16/2018   Hypokalemia 06/16/2018   Tobacco abuse 06/16/2018   Low back pain 04/15/2018   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HCC) 04/09/2018   Cocaine abuse, episodic use (HCC) 04/09/2018   Alcohol use disorder, severe, dependence (HCC) 04/09/2018   History of cesarean delivery 06/06/2017   Contraception management 06/06/2017   Positive GBS test 06/02/2017   Examination of participant in clinical trial 06/02/2017   Substance abuse (HCC) 04/04/2017   Vaginitis affecting pregnancy in second trimester, antepartum 02/25/2017   Cervical insufficiency during pregnancy, antepartum 12/20/2016   Previous child with congenital anomaly, currently pregnant, antepartum 12/19/2016   MDD (major depressive disorder), recurrent episode, mild (HCC) 07/17/2016   PTSD (post-traumatic stress disorder) 07/17/2016

## 2022-02-11 NOTE — Group Note (Signed)
Group Topic: Relapse Prevention  Group Date: 02/08/2022 Start Time:  9:00 AM End Time: 12:00 PM Facilitators: Cheri Fowler, [facility]  Department: [facility]  Group Topic: Relapse Prevention & Stages of Change Group Date: 02/08/2022 Start Time: 0900 End Time: 1200 Facilitators: Cheri Fowler Department: [facility] CD-IOP  @GROUPNOTE @ Name: No patient name on file. Date of Birth: There is no date of birth on file.  MR: No patient ID available    Level of Participation: active Quality of Participation: cooperative and engaged Interactions with others: gave feedback Mood/Affect: appropriate Triggers (if applicable):  Cognition: coherent/clear and insightful Progress: Gaining insight Response: Client is alert and oriented x5 during group session. Client is active in group discussion and provides feedback to peers. Client processed issues and stressors as they came up in group. Denies SI/HI, AVH or psychosis. Plan: patient will be encouraged to attend group treatment and abstain from substance use.  Patients Problems:  @PROB @   Family Program: Family present? No   Name of family member(s): 0  Name: Camryn Lampson Date of Birth: 1986/03/23  MR: [name]     Patients Problems:  Patient Active Problem List   Diagnosis Date Noted   Unspecified mood (affective) disorder (HCC) 01/25/2022   Psychosis (HCC) 01/25/2022   Asthma without status asthmaticus 05/13/2021   HSV (herpes simplex virus) infection 05/13/2021   BV (bacterial vaginosis) 04/24/2021   Cervical cancer screening 04/08/2020   High risk sexual behavior 03/15/2020   Substance induced mood disorder (HCC) 01/15/2020   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   Opiate abuse, episodic (HCC) 01/15/2020   Homelessness 11/19/2019   Generalized anxiety disorder 08/05/2019   History of substance abuse (HCC) 08/05/2019   BMI 32.0-32.9,adult 06/16/2018   Generalized abdominal pain 06/16/2018    Hypokalemia 06/16/2018   Tobacco abuse 06/16/2018   Low back pain 04/15/2018   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HCC) 04/09/2018   Cocaine abuse, episodic use (HCC) 04/09/2018   Alcohol use disorder, severe, dependence (HCC) 04/09/2018   History of cesarean delivery 06/06/2017   Contraception management 06/06/2017   Positive GBS test 06/02/2017   Examination of participant in clinical trial 06/02/2017   Substance abuse (HCC) 04/04/2017   Vaginitis affecting pregnancy in second trimester, antepartum 02/25/2017   Cervical insufficiency during pregnancy, antepartum 12/20/2016   Previous child with congenital anomaly, currently pregnant, antepartum 12/19/2016   MDD (major depressive disorder), recurrent episode, mild (HCC) 07/17/2016   PTSD (post-traumatic stress disorder) 07/17/2016     Family Program: Family present? No   Name of family member(s): 0  UDS collected: Yes Results: Results pending  AA/NA attended?: No  Sponsor?: No

## 2022-02-13 ENCOUNTER — Ambulatory Visit (HOSPITAL_COMMUNITY): Payer: Medicaid Other

## 2022-02-14 ENCOUNTER — Ambulatory Visit (HOSPITAL_COMMUNITY): Payer: Commercial Managed Care - HMO

## 2022-02-15 ENCOUNTER — Ambulatory Visit (HOSPITAL_COMMUNITY): Payer: Medicaid Other

## 2022-02-18 ENCOUNTER — Ambulatory Visit (HOSPITAL_COMMUNITY): Payer: Medicaid Other

## 2022-02-19 NOTE — Progress Notes (Unsigned)
Patient did not connect for virtual psychiatric medication management appointment on 02/20/22 at 8AM. Called phone with no answer; left VM with callback number to reschedule.  Daine Gip, MD 02/20/22

## 2022-02-20 ENCOUNTER — Encounter (HOSPITAL_COMMUNITY): Payer: Self-pay

## 2022-02-20 ENCOUNTER — Encounter (HOSPITAL_COMMUNITY): Payer: Medicaid Other | Admitting: Psychiatry

## 2022-02-20 ENCOUNTER — Ambulatory Visit (HOSPITAL_COMMUNITY): Payer: Medicaid Other

## 2022-02-20 DIAGNOSIS — F29 Unspecified psychosis not due to a substance or known physiological condition: Secondary | ICD-10-CM

## 2022-02-20 DIAGNOSIS — F4310 Post-traumatic stress disorder, unspecified: Secondary | ICD-10-CM

## 2022-02-20 DIAGNOSIS — F1020 Alcohol dependence, uncomplicated: Secondary | ICD-10-CM

## 2022-02-20 DIAGNOSIS — F39 Unspecified mood [affective] disorder: Secondary | ICD-10-CM

## 2022-02-20 DIAGNOSIS — G2401 Drug induced subacute dyskinesia: Secondary | ICD-10-CM

## 2022-02-20 DIAGNOSIS — F1410 Cocaine abuse, uncomplicated: Secondary | ICD-10-CM

## 2022-02-22 ENCOUNTER — Ambulatory Visit (HOSPITAL_COMMUNITY): Payer: Medicaid Other

## 2022-02-25 ENCOUNTER — Ambulatory Visit (HOSPITAL_COMMUNITY): Payer: Medicaid Other

## 2022-02-27 ENCOUNTER — Ambulatory Visit (HOSPITAL_COMMUNITY): Payer: Medicaid Other

## 2022-03-01 ENCOUNTER — Ambulatory Visit (HOSPITAL_COMMUNITY): Payer: Medicaid Other

## 2022-03-03 IMAGING — DX DG CHEST 2V
2 series · 2 of 2 positions shown · non-contrast
Comparison: None.

CLINICAL DATA: Chest pain with recent mold exposure.

EXAM:
CHEST - 2 VIEW

[w chest pa]
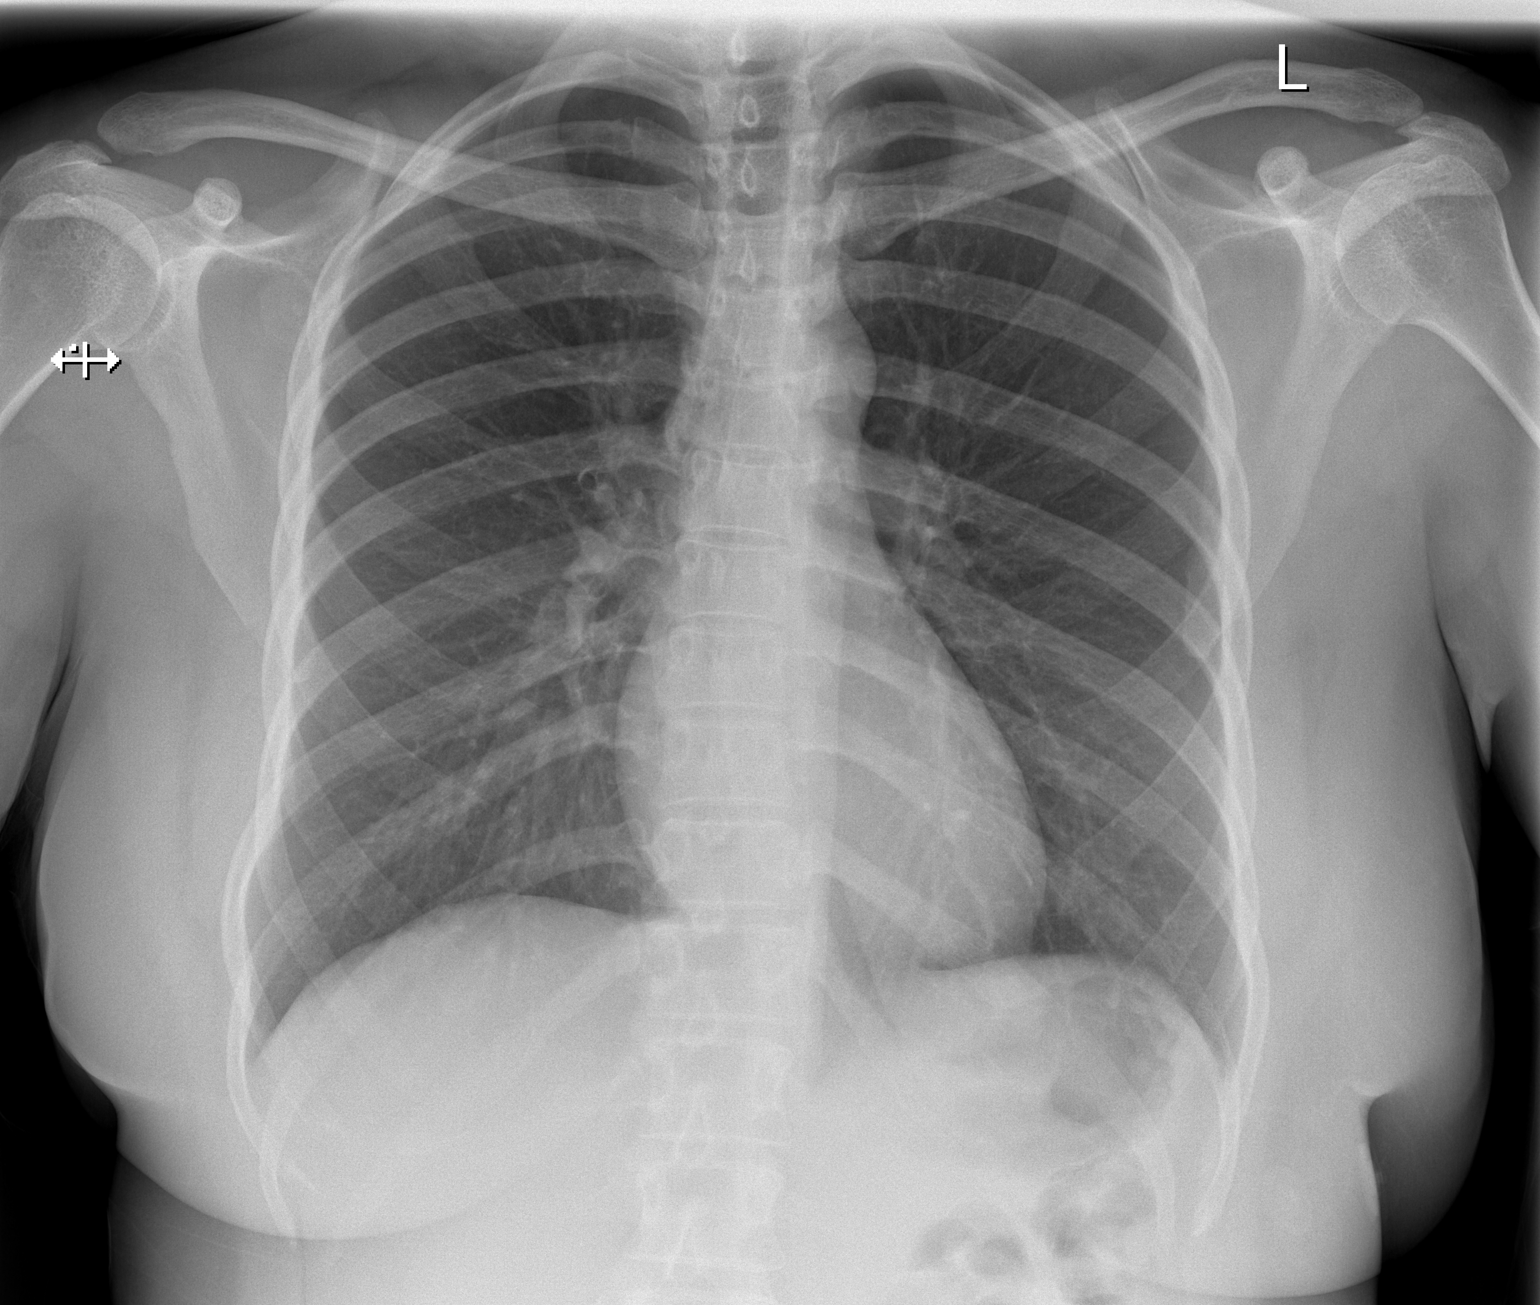

[w chest lat]
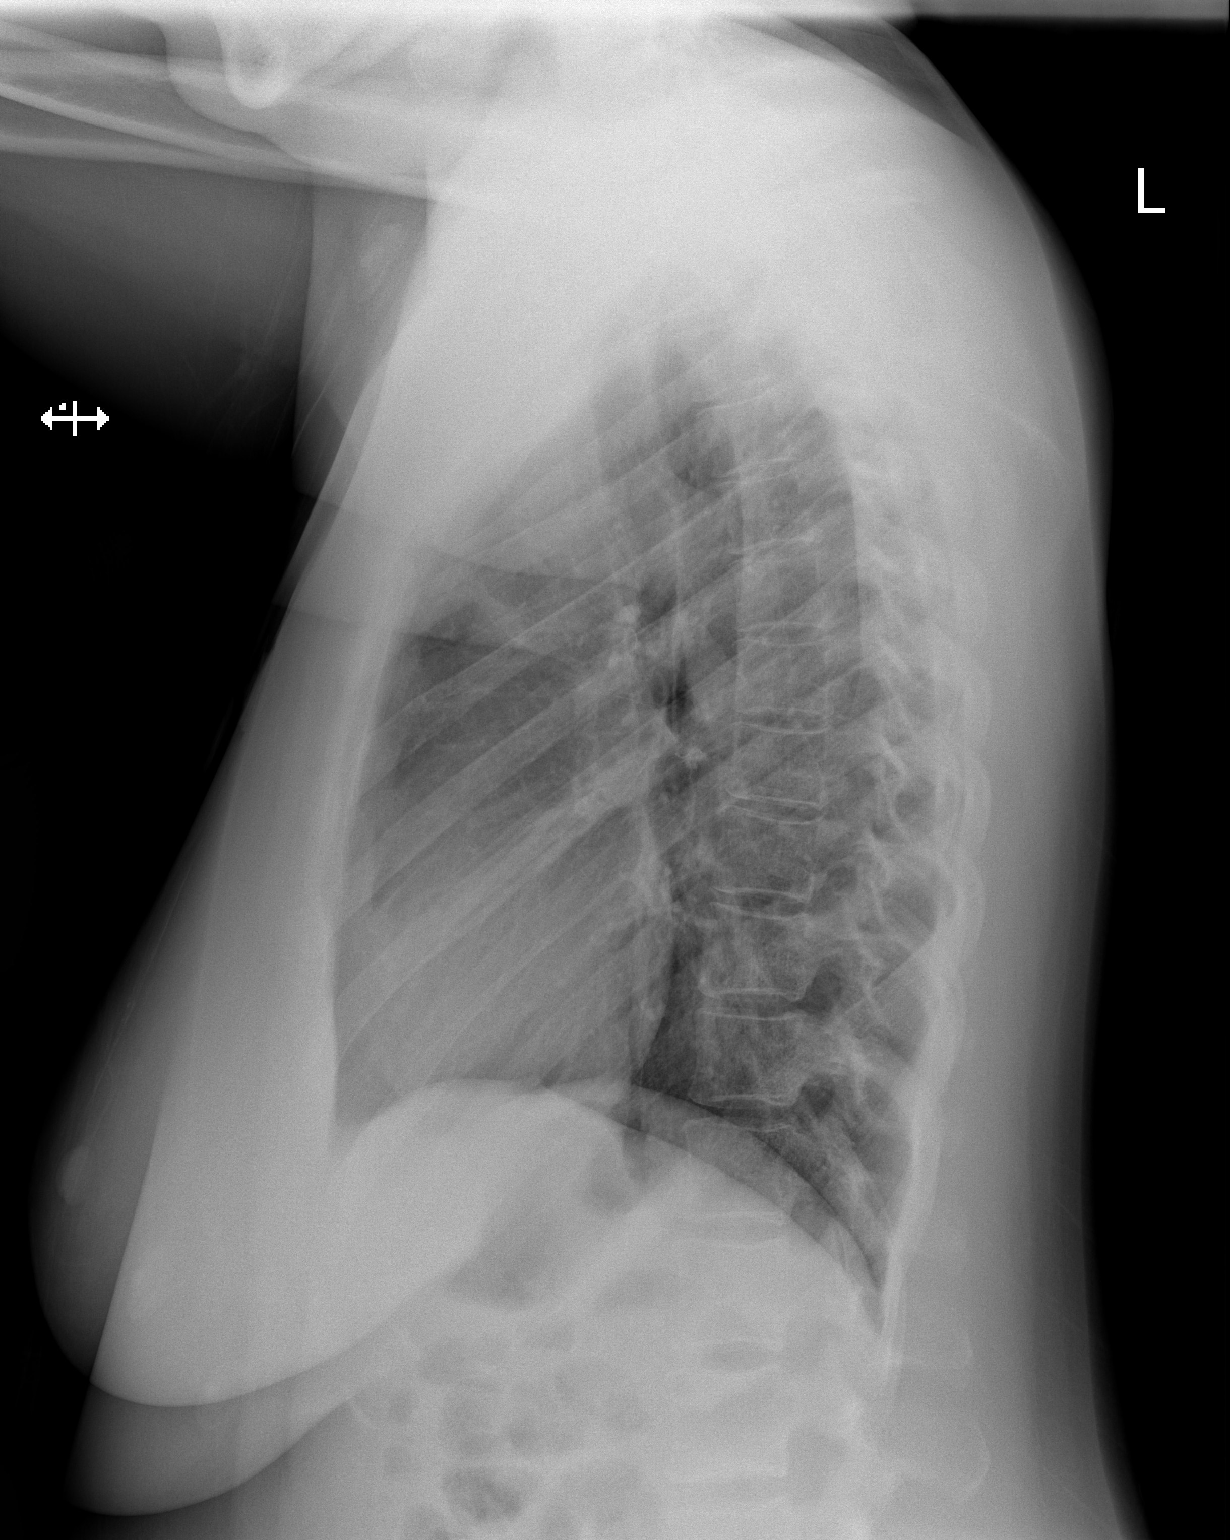

[2 of 2 positions shown; findings below may reference images not displayed]

FINDINGS: The heart size and mediastinal contours are within normal limits.
Both lungs are clear. The visualized skeletal structures are
unremarkable.
IMPRESSION: No active cardiopulmonary disease.

## 2022-03-04 ENCOUNTER — Ambulatory Visit (HOSPITAL_COMMUNITY): Payer: Medicaid Other

## 2022-10-18 ENCOUNTER — Emergency Department (HOSPITAL_COMMUNITY): Payer: MEDICAID

## 2022-10-18 ENCOUNTER — Encounter (HOSPITAL_COMMUNITY): Payer: Self-pay | Admitting: *Deleted

## 2022-10-18 ENCOUNTER — Other Ambulatory Visit: Payer: Self-pay

## 2022-10-18 ENCOUNTER — Emergency Department (HOSPITAL_COMMUNITY)
Admission: EM | Admit: 2022-10-18 | Discharge: 2022-10-18 | Disposition: A | Discharge status: Home / Self Care | Payer: MEDICAID | Attending: Emergency Medicine | Admitting: Emergency Medicine

## 2022-10-18 DIAGNOSIS — S81812A Laceration without foreign body, left lower leg, initial encounter: Secondary | ICD-10-CM | POA: Insufficient documentation

## 2022-10-18 DIAGNOSIS — Z23 Encounter for immunization: Secondary | ICD-10-CM | POA: Diagnosis not present

## 2022-10-18 DIAGNOSIS — R519 Headache, unspecified: Secondary | ICD-10-CM | POA: Diagnosis not present

## 2022-10-18 DIAGNOSIS — S8992XA Unspecified injury of left lower leg, initial encounter: Secondary | ICD-10-CM | POA: Diagnosis present

## 2022-10-18 DIAGNOSIS — Z91010 Allergy to peanuts: Secondary | ICD-10-CM | POA: Insufficient documentation

## 2022-10-18 DIAGNOSIS — S61251A Open bite of left index finger without damage to nail, initial encounter: Secondary | ICD-10-CM | POA: Insufficient documentation

## 2022-10-18 LAB — WET PREP, GENITAL
Clue Cells Wet Prep HPF POC: NONE SEEN
Sperm: NONE SEEN
Trich, Wet Prep: NONE SEEN
WBC, Wet Prep HPF POC: <10 (ref <10)
Yeast Wet Prep HPF POC: NONE SEEN

## 2022-10-18 LAB — HIV ANTIBODY (ROUTINE TESTING W REFLEX): HIV Screen 4th Generation wRfx: NONREACTIVE

## 2022-10-18 MED ORDER — AMOXICILLIN-POT CLAVULANATE 875-125 MG PO TABS
1.0000 | ORAL_TABLET | Freq: Once | ORAL | Status: AC
  Administered 2022-10-18: 1 via ORAL

## 2022-10-18 MED ORDER — IBUPROFEN 400 MG PO TABS
600.0000 mg | ORAL_TABLET | Freq: Once | ORAL | Status: AC
  Administered 2022-10-18: 600 mg via ORAL

## 2022-10-18 MED ORDER — LIDOCAINE HCL (PF) 1 % IJ SOLN
1.0000 mL | Freq: Once | INTRAMUSCULAR | Status: AC
  Administered 2022-10-18: 1 mL

## 2022-10-18 MED ORDER — AZITHROMYCIN 250 MG PO TABS: ORAL_TABLET | ORAL | 0 refills | Status: DC

## 2022-10-18 MED ORDER — DOXYCYCLINE HYCLATE 100 MG PO CAPS: 100.0000 mg | ORAL_CAPSULE | Freq: Two times a day (BID) | ORAL | 0 refills | Status: DC

## 2022-10-18 MED ORDER — LIDOCAINE-EPINEPHRINE (PF) 2 %-1:200000 IJ SOLN
10.0000 mL | Freq: Once | INTRAMUSCULAR | Status: AC
  Administered 2022-10-18: 10 mL

## 2022-10-18 MED ORDER — AMOXICILLIN-POT CLAVULANATE 875-125 MG PO TABS: 1.0000 | ORAL_TABLET | Freq: Two times a day (BID) | ORAL | 0 refills | Status: DC

## 2022-10-18 MED ORDER — DOXYCYCLINE HYCLATE 100 MG PO TABS
100.0000 mg | ORAL_TABLET | Freq: Once | ORAL | Status: AC
  Administered 2022-10-18: 100 mg via ORAL

## 2022-10-18 MED ORDER — CEFTRIAXONE SODIUM 500 MG IJ SOLR
500.0000 mg | Freq: Once | INTRAMUSCULAR | Status: AC
  Administered 2022-10-18: 500 mg via INTRAMUSCULAR

## 2022-10-18 MED ORDER — TETANUS-DIPHTH-ACELL PERTUSSIS 5-2.5-18.5 LF-MCG/0.5 IM SUSY
0.5000 mL | PREFILLED_SYRINGE | Freq: Once | INTRAMUSCULAR | Status: AC
  Administered 2022-10-18: 0.5 mL via INTRAMUSCULAR
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
## 2022-10-18 NOTE — ED Notes (Signed)
Patient verbalizes understanding of discharge instructions. Opportunity for questioning and answers were provided. Armband removed by staff, pt discharged from ED. Pt ambulatory to ED waiting room with steady gait.  
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
## 2022-10-19 LAB — RPR: RPR Ser Ql: NONREACTIVE

## 2022-10-21 LAB — GC/CHLAMYDIA PROBE AMP (~~LOC~~) NOT AT ARMC
Chlamydia: NEGATIVE
Comment: NEGATIVE
Comment: NORMAL
Neisseria Gonorrhea: NEGATIVE

## 2022-10-22 ENCOUNTER — Other Ambulatory Visit: Payer: Self-pay

## 2022-10-22 ENCOUNTER — Encounter (HOSPITAL_COMMUNITY): Payer: Self-pay | Admitting: Emergency Medicine

## 2022-10-22 ENCOUNTER — Ambulatory Visit: Payer: MEDICAID | Admitting: Plastic Surgery

## 2022-10-22 ENCOUNTER — Emergency Department (HOSPITAL_COMMUNITY)
Admission: EM | Admit: 2022-10-22 | Discharge: 2022-10-23 | Disposition: A | Discharge status: Other Acute Inpatient Hospital | Payer: MEDICAID | Attending: Emergency Medicine | Admitting: Emergency Medicine

## 2022-10-22 DIAGNOSIS — Y901 Blood alcohol level of 20-39 mg/100 ml: Secondary | ICD-10-CM | POA: Insufficient documentation

## 2022-10-22 DIAGNOSIS — L03116 Cellulitis of left lower limb: Secondary | ICD-10-CM | POA: Insufficient documentation

## 2022-10-22 DIAGNOSIS — Z91010 Allergy to peanuts: Secondary | ICD-10-CM | POA: Diagnosis not present

## 2022-10-22 DIAGNOSIS — F39 Unspecified mood [affective] disorder: Secondary | ICD-10-CM | POA: Insufficient documentation

## 2022-10-22 DIAGNOSIS — R000 Tachycardia, unspecified: Secondary | ICD-10-CM | POA: Diagnosis not present

## 2022-10-22 DIAGNOSIS — Z760 Encounter for issue of repeat prescription: Secondary | ICD-10-CM | POA: Diagnosis not present

## 2022-10-22 DIAGNOSIS — F419 Anxiety disorder, unspecified: Secondary | ICD-10-CM | POA: Diagnosis present

## 2022-10-22 DIAGNOSIS — Z5900 Homelessness unspecified: Secondary | ICD-10-CM | POA: Insufficient documentation

## 2022-10-22 DIAGNOSIS — Z79899 Other long term (current) drug therapy: Secondary | ICD-10-CM | POA: Diagnosis not present

## 2022-10-22 DIAGNOSIS — F1914 Other psychoactive substance abuse with psychoactive substance-induced mood disorder: Secondary | ICD-10-CM | POA: Insufficient documentation

## 2022-10-22 DIAGNOSIS — F1994 Other psychoactive substance use, unspecified with psychoactive substance-induced mood disorder: Secondary | ICD-10-CM | POA: Diagnosis present

## 2022-10-22 DIAGNOSIS — F29 Unspecified psychosis not due to a substance or known physiological condition: Secondary | ICD-10-CM

## 2022-10-22 LAB — COMPREHENSIVE METABOLIC PANEL
ALT: 20 U/L (ref 0–44)
AST: 30 U/L (ref 15–41)
Albumin: 3.6 g/dL (ref 3.5–5.0)
Alkaline Phosphatase: 54 U/L (ref 38–126)
Anion gap: 7 (ref 5–15)
BUN: 7 mg/dL (ref 6–20)
CO2: 24 mmol/L (ref 22–32)
Calcium: 8.6 mg/dL — ABNORMAL LOW (ref 8.9–10.3)
Chloride: 104 mmol/L (ref 98–111)
Creatinine, Ser: 0.7 mg/dL (ref 0.44–1.00)
GFR, Estimated: >60 mL/min (ref >60)
Glucose, Bld: 82 mg/dL (ref 70–99)
Potassium: 3.9 mmol/L (ref 3.5–5.1)
Sodium: 135 mmol/L (ref 135–145)
Total Bilirubin: 0.6 mg/dL (ref 0.3–1.2)
Total Protein: 6.1 g/dL — ABNORMAL LOW (ref 6.5–8.1)

## 2022-10-22 LAB — CBC
HCT: 35.5 % — ABNORMAL LOW (ref 36.0–46.0)
Hemoglobin: 12 g/dL (ref 12.0–15.0)
MCH: 33.5 pg (ref 26.0–34.0)
MCHC: 33.8 g/dL (ref 30.0–36.0)
MCV: 99.2 fL (ref 80.0–100.0)
Platelets: 335 10*3/uL (ref 150–400)
RBC: 3.58 MIL/uL — ABNORMAL LOW (ref 3.87–5.11)
RDW: 12.4 % (ref 11.5–15.5)
WBC: 5.1 10*3/uL (ref 4.0–10.5)
nRBC: 0 % (ref 0.0–0.2)

## 2022-10-22 LAB — URINALYSIS, ROUTINE W REFLEX MICROSCOPIC
Bilirubin Urine: NEGATIVE
Glucose, UA: NEGATIVE mg/dL
Hgb urine dipstick: NEGATIVE
Ketones, ur: NEGATIVE mg/dL
Leukocytes,Ua: NEGATIVE
Nitrite: NEGATIVE
Protein, ur: NEGATIVE mg/dL
Specific Gravity, Urine: 1.002 — ABNORMAL LOW (ref 1.005–1.030)
pH: 7 (ref 5.0–8.0)

## 2022-10-22 LAB — ACETAMINOPHEN LEVEL: Acetaminophen (Tylenol), Serum: <10 ug/mL — ABNORMAL LOW (ref 10–30)

## 2022-10-22 LAB — RAPID URINE DRUG SCREEN, HOSP PERFORMED
Amphetamines: NOT DETECTED
Barbiturates: NOT DETECTED
Benzodiazepines: NOT DETECTED
Cocaine: POSITIVE — AB
Opiates: NOT DETECTED
Tetrahydrocannabinol: NOT DETECTED

## 2022-10-22 LAB — SALICYLATE LEVEL: Salicylate Lvl: <7 mg/dL — ABNORMAL LOW (ref 7.0–30.0)

## 2022-10-22 LAB — HCG, SERUM, QUALITATIVE: Preg, Serum: NEGATIVE

## 2022-10-22 LAB — ETHANOL: Alcohol, Ethyl (B): 30 mg/dL — ABNORMAL HIGH (ref <10)

## 2022-10-22 MED ORDER — HYDROXYZINE HCL 25 MG PO TABS
25.0000 mg | ORAL_TABLET | Freq: Four times a day (QID) | ORAL | Status: DC | PRN
  Administered 2022-10-22: 25 mg via ORAL
  Filled 2022-10-22: qty 1

## 2022-10-22 MED ORDER — AMOXICILLIN-POT CLAVULANATE 875-125 MG PO TABS
1.0000 | ORAL_TABLET | Freq: Every day | ORAL | Status: DC
  Administered 2022-10-22: 1 via ORAL
  Filled 2022-10-22: qty 1

## 2022-10-22 MED ORDER — LORAZEPAM 1 MG PO TABS
1.0000 mg | ORAL_TABLET | Freq: Once | ORAL | Status: AC
  Administered 2022-10-22: 1 mg via ORAL
  Filled 2022-10-22: qty 1

## 2022-10-22 MED ORDER — VENLAFAXINE HCL ER 37.5 MG PO CP24
37.5000 mg | ORAL_CAPSULE | Freq: Every day | ORAL | Status: DC
  Filled 2022-10-22: qty 1

## 2022-10-22 MED ORDER — OLANZAPINE 5 MG PO TBDP
5.0000 mg | ORAL_TABLET | Freq: Every day | ORAL | Status: DC
  Administered 2022-10-22: 5 mg via ORAL
  Filled 2022-10-22: qty 1

## 2022-10-22 NOTE — ED Provider Notes (Signed)
Signout from [employer] at shift change. Briefly, patient presents for suicidal ideation and medication needs.   Plan: Complete medical clearance and TTS evaluation.   3:49 PM   Labs and imaging personally reviewed and interpreted including: CBC unremarkable; CMP unremarkable; ethanol level at 30; salicylate and acetaminophen levels are negative.   Most current vital signs reviewed and are as follows: BP 134/87   Pulse (!) 111   Temp 98.6 F (37 C) (Oral)   Resp 20   Ht 5\' 4"  (1.626 m)   Wt 81.6 kg   LMP 10/17/2022 (Exact Date)   SpO2 95%   BMI 30.90 kg/m   Plan: 1 mg p.o. Ativan ordered per RN request statement patient becoming more agitated.  Otherwise, awaiting TTS evaluation.  10:01 PM reviewed psychiatry note.  Inpatient psychiatric treatment recommended.  Patient is medically cleared.        Renne Crigler, PA-C 10/22/22 2202    Ernie Avena, MD 10/22/22 2329

## 2022-10-22 NOTE — ED Triage Notes (Signed)
Per [facility] pt coming from [facility] c/o anxiety. States she has been out of all her daily meds for a long time. States the [facility] will not help her gets any of her meds. States she is having a hard time finding a PCP to treat her with medicaid.

## 2022-10-22 NOTE — ED Notes (Signed)
Pt brought over into room 51. Pt changed out by previous RN and belongings were placed into pt locker 3

## 2022-10-22 NOTE — Consult Note (Signed)
[facility] ED ASSESSMENT   Reason for Consult:  SI Referring Physician:  Prosperi Patient Identification: Darlene Gates MRN:  [id number] ED Chief Complaint: Substance induced mood disorder (HCC)  Diagnosis:  Principal Problem:   Substance induced mood disorder (HCC) Active Problems:   Homelessness   Unspecified mood (affective) disorder [facility])   ED Assessment Time Calculation: Start Time: 1645 Stop Time: 1730 Total Time in Minutes (Assessment Completion): 45   HPI:   Darlene Gates is a 37 y.o. female patient here for anxiety, substance abuse, homelessness, cluster B personality disorder who presents with concern for suicidal ideation reporting that she wants to take some pills and end her life.  She denies any recent previous suicide attempts.  She also reports that she is having a hard time finding a primary care doctor, is having worsening anxiety symptoms, and wants all of her medications refilled.  She reports that she has not taken anything in 2 years, and she cannot tell me what she is supposed to be taking.   Subjective:   Pt seen at [facility] for face to face psychiatric evaluation. Pt states she is homeless, has had difficulty getting outpatient care, has been off psychotropic medications for at least a year, and has had worsening depression and suicidal ideations. Pt does continue to endorse SI with a plan to overdose. She denies HI. Denies AVH. She reports her sleep is "okay" and denies problems with appetite. She does endorse alcohol use, drinks multiple beers daily. She also endorses daily cocaine use.   Pt stated "start whatever medicine you want I don't remember what I was on." Per chart review, appears last psychotropic medications included Effexor and Zyprexa. She is currently homeless, she reports trying to find a job and states she has a job interview next Monday. She does have some support from her sister, but is unable to live with her. She is unable to identify any reasons  to live, and is unable to contract for safety.   Will recommend inpatient psychiatric treatment. Will restart Effexor at 37.5 mg daily and Zyprexa 5 mg at bedtime. If no availability at [facility] will request CSW to fax out.   Past Psychiatric History:  See above in HPI  Risk to Self or Others: Is the patient at risk to self? Yes Has the patient been a risk to self in the past 6 months? No Has the patient been a risk to self within the distant past? Yes Is the patient a risk to others? No Has the patient been a risk to others in the past 6 months? No Has the patient been a risk to others within the distant past? No  [place] Scale:  Flowsheet Row ED from 10/22/2022 in [facility] Emergency Department at [facility] ED from 10/18/2022 in [facility] Emergency Department at [facility] ED from 01/23/2022 in [facility] Emergency Department at [facility]  C-SSRS RISK CATEGORY No Risk No Risk No Risk      Past Medical History:  Past Medical History:  Diagnosis Date   Alcohol use disorder, severe, dependence (HCC)    Anxiety    Cocaine use    Depression    Psychosis, unspecified psychosis type (HCC)    History reviewed. No pertinent surgical history. Family History:  Family History  Problem Relation Age of Onset   Alcohol abuse Mother    Bipolar disorder Mother    Depression Mother    Alcohol abuse Father    Hyperlipidemia  Father    Hypertension Father    Alcohol abuse Maternal Grandmother    Bipolar disorder Maternal Grandmother    Alcohol abuse Maternal Grandfather    Social History:  Social History   Substance and Sexual Activity  Alcohol Use Yes   Alcohol/week: 4.0 standard drinks of alcohol   Types: 3 Cans of beer, 1 Shots of liquor per week   Comment: pt reports she drinks daily     Social History   Substance and Sexual Activity  Drug Use Not on file   Comment: pt reports she uses daily    Social History   Socioeconomic History   Marital  status: Married    Spouse name: Not on file   Number of children: Not on file   Years of education: Not on file   Highest education level: Not on file  Occupational History   Not on file  Tobacco Use   Smoking status: Every Day    Current packs/day: 2.00    Types: Cigarettes   Smokeless tobacco: Never  Vaping Use   Vaping status: Never Used  Substance and Sexual Activity   Alcohol use: Yes    Alcohol/week: 4.0 standard drinks of alcohol    Types: 3 Cans of beer, 1 Shots of liquor per week    Comment: pt reports she drinks daily   Drug use: Not on file    Comment: pt reports she uses daily   Sexual activity: Yes    Partners: Male    Birth control/protection: Injection, Condom  Other Topics Concern   Not on file  Social History Narrative   Not on file   Social Determinants of Health   Financial Resource Strain: Not on file  Food Insecurity: Not on file  Transportation Needs: Not on file  Physical Activity: Not on file  Stress: Not on file  Social Connections: Not on file   Additional Social History:  homeless  Allergies:   Allergies  Allergen Reactions   Peanut (Diagnostic) Anaphylaxis and Hives   Peanut Oil Anaphylaxis and Hives   Percocet [Oxycodone-Acetaminophen] Hives   Phenergan [Promethazine] Anaphylaxis and Nausea And Vomiting   Pineapple Swelling   Thorazine [Chlorpromazine] Anaphylaxis   Bee Venom Hives    Labs:  Results for orders placed or performed during the hospital encounter of 10/22/22 (from the past 48 hour(s))  Urinalysis, Routine w reflex microscopic -Urine, Clean Catch     Status: Abnormal   Collection Time: 10/22/22  2:00 PM  Result Value Ref Range   Color, Urine COLORLESS (A) YELLOW   APPearance CLEAR CLEAR   Specific Gravity, Urine 1.002 (L) 1.005 - 1.030   pH 7.0 5.0 - 8.0   Glucose, UA NEGATIVE NEGATIVE mg/dL   Hgb urine dipstick NEGATIVE NEGATIVE   Bilirubin Urine NEGATIVE NEGATIVE   Ketones, ur NEGATIVE NEGATIVE mg/dL    Protein, ur NEGATIVE NEGATIVE mg/dL   Nitrite NEGATIVE NEGATIVE   Leukocytes,Ua NEGATIVE NEGATIVE    Comment: Performed at [facility], [address] [place], [place] [address]  Rapid urine drug screen (hospital performed)     Status: Abnormal   Collection Time: 10/22/22  2:00 PM  Result Value Ref Range   Opiates NONE DETECTED NONE DETECTED   Cocaine POSITIVE (A) NONE DETECTED   Benzodiazepines NONE DETECTED NONE DETECTED   Amphetamines NONE DETECTED NONE DETECTED   Tetrahydrocannabinol NONE DETECTED NONE DETECTED   Barbiturates NONE DETECTED NONE DETECTED    Comment: (NOTE) DRUG SCREEN FOR MEDICAL  PURPOSES ONLY.  IF CONFIRMATION IS NEEDED FOR ANY PURPOSE, NOTIFY LAB WITHIN 5 DAYS.  LOWEST DETECTABLE LIMITS FOR URINE DRUG SCREEN Drug Class                     Cutoff (ng/mL) Amphetamine and metabolites    1000 Barbiturate and metabolites    200 Benzodiazepine                 200 Opiates and metabolites        300 Cocaine and metabolites        300 THC                            50 Performed at [facility], [address] [place], [place] [address]   Acetaminophen level     Status: Abnormal   Collection Time: 10/22/22  2:45 PM  Result Value Ref Range   Acetaminophen (Tylenol), Serum <10 (L) 10 - 30 ug/mL    Comment: (NOTE) Therapeutic concentrations vary significantly. A range of 10-30 ug/mL  may be an effective concentration for many patients. However, some  are best treated at concentrations outside of this range. Acetaminophen concentrations >150 ug/mL at 4 hours after ingestion  and >50 ug/mL at 12 hours after ingestion are often associated with  toxic reactions.  Performed at [facility], [address], [place], [place] [address]   Comprehensive metabolic panel     Status: Abnormal   Collection Time: 10/22/22  2:45 PM  Result Value Ref Range   Sodium 135 135 - 145 mmol/L   Potassium 3.9 3.5 - 5.1 mmol/L   Chloride 104 98 - 111 mmol/L    CO2 24 22 - 32 mmol/L   Glucose, Bld 82 70 - 99 mg/dL    Comment: Glucose reference range applies only to samples taken after fasting for at least 8 hours.   BUN 7 6 - 20 mg/dL   Creatinine, Ser 0.98 0.44 - 1.00 mg/dL   Calcium 8.6 (L) 8.9 - 10.3 mg/dL   Total Protein 6.1 (L) 6.5 - 8.1 g/dL   Albumin 3.6 3.5 - 5.0 g/dL   AST 30 15 - 41 U/L   ALT 20 0 - 44 U/L   Alkaline Phosphatase 54 38 - 126 U/L   Total Bilirubin 0.6 0.3 - 1.2 mg/dL   GFR, Estimated >11 >91 mL/min    Comment: (NOTE) Calculated using the CKD-EPI Creatinine Equation (2021)    Anion gap 7 5 - 15    Comment: Performed at [facility], [address], [place], [place] [address]  CBC     Status: Abnormal   Collection Time: 10/22/22  2:45 PM  Result Value Ref Range   WBC 5.1 4.0 - 10.5 K/uL   RBC 3.58 (L) 3.87 - 5.11 MIL/uL   Hemoglobin 12.0 12.0 - 15.0 g/dL   HCT 56.2 (L) 13.0 - 86.5 %   MCV 99.2 80.0 - 100.0 fL   MCH 33.5 26.0 - 34.0 pg   MCHC 33.8 30.0 - 36.0 g/dL   RDW 78.4 69.6 - 29.5 %   Platelets 335 150 - 400 K/uL   nRBC 0.0 0.0 - 0.2 %    Comment: Performed at [facility], [address] [place], [place] [address]  Ethanol     Status: Abnormal   Collection Time: 10/22/22  2:45 PM  Result Value Ref Range  Alcohol, Ethyl (B) 30 (H) <10 mg/dL    Comment: (NOTE) Lowest detectable limit for serum alcohol is 10 mg/dL.  For medical purposes only. Performed at [facility], [address] [place], [place] [address]   Salicylate level     Status: Abnormal   Collection Time: 10/22/22  2:45 PM  Result Value Ref Range   Salicylate Lvl <7.0 (L) 7.0 - 30.0 mg/dL    Comment: Performed at [facility], [address] [place], [place] [address]  hCG, serum, qualitative     Status: None   Collection Time: 10/22/22  2:45 PM  Result Value Ref Range   Preg, Serum NEGATIVE NEGATIVE    Comment:        THE SENSITIVITY OF THIS METHODOLOGY IS >10 mIU/mL. Performed at [facility]  Lab, [address], [place], [place] [address]     Current Facility-Administered Medications  Medication Dose Route Frequency Provider Last Rate Last Admin   amoxicillin-clavulanate (AUGMENTIN) 875-125 MG per tablet 1 tablet  1 tablet Oral Daily Prosperi, Christian H, PA-C   1 tablet at 10/22/22 1553   hydrOXYzine (ATARAX) tablet 25 mg  25 mg Oral Q6H PRN Prosperi, Christian H, PA-C       OLANZapine zydis (ZYPREXA) disintegrating tablet 5 mg  5 mg Oral QHS Eligha Bridegroom, NP       Current Outpatient Medications  Medication Sig Dispense Refill   acetaminophen (TYLENOL) 500 MG tablet Take 1,000 mg by mouth 2 (two) times daily as needed for moderate pain, fever or headache.     amoxicillin-clavulanate (AUGMENTIN) 875-125 MG tablet Take 1 tablet by mouth every 12 (twelve) hours. 14 tablet 0   azithromycin (ZITHROMAX Z-PAK) 250 MG tablet Take 2 tablets (500 mg total) by mouth daily for 1 day, THEN 1 tablet (250 mg total) daily for 4 days. 6 tablet 0   Prenatal MV & Min w/FA-DHA (PRENATAL ADULT GUMMY/DHA/FA PO) Take 1 each by mouth daily.      Psychiatric Specialty Exam: Presentation  General Appearance:  Appropriate for Environment  Eye Contact: Fair  Speech: Clear and Coherent  Speech Volume: Normal  Handedness:No data recorded  Mood and Affect  Mood: Irritable  Affect: Flat   Thought Process  Thought Processes: Coherent  Descriptions of Associations:Intact  Orientation:Full (Time, Place and Person)  Thought Content:WDL  History of Schizophrenia/Schizoaffective disorder:No data recorded Duration of Psychotic Symptoms:No data recorded Hallucinations:Hallucinations: None  Ideas of Reference:None  Suicidal Thoughts:Suicidal Thoughts: Yes, Passive SI Passive Intent and/or Plan: Without Intent; Without Plan  Homicidal Thoughts:Homicidal Thoughts: No   Sensorium  Memory: Immediate Fair  Judgment: Fair  Insight: Fair   Art therapist   Concentration: Fair  Attention Span: Fair  Recall: [employer] of Knowledge: Fair  Language: Fair   Psychomotor Activity  Psychomotor Activity: Psychomotor Activity: Normal   Assets  Assets: Desire for Improvement; Physical Health; Resilience    Sleep  Sleep: Sleep: Fair   Physical Exam: Physical Exam Neurological:     Mental Status: She is alert and oriented to person, place, and time.  Psychiatric:        Attention and Perception: Attention normal.        Mood and Affect: Mood is depressed.        Speech: Speech normal.        Behavior: Behavior is cooperative.        Thought Content: Thought content includes suicidal ideation.    Review of Systems  Psychiatric/Behavioral:  Positive for depression, substance abuse and suicidal ideas. The patient has insomnia.   All other systems reviewed and are negative.  Blood pressure 134/87, pulse (!) 111, temperature 98.6 F (37 C), temperature source Oral, resp. rate 20, height 5\' 4"  (1.626 m), weight 81.6 kg, last menstrual period 10/17/2022, SpO2 95%, unknown if currently breastfeeding. Body mass index is 30.9 kg/m.  Medical Decision Making: Pt case reviewed and discussed with Dr. Lucianne Muss. Will recommend inpatient psychiatric treatment at this time due to SI with plan, and unable to contract for safety. If no availability at [facility] will request CSW to fax out.   Problem 1: substance induced mood disorder, SI - Start Effexor XR 37.5 mg daily  - Zyprexa 5 mg Qhs   Disposition:  Recommend IP treatment  Eligha Bridegroom, NP 10/22/2022 4:55 PM

## 2022-10-22 NOTE — ED Provider Notes (Signed)
[place] EMERGENCY DEPARTMENT AT [facility] Provider Note   CSN: [id number] Arrival date & time: 10/22/22  1248     History  Chief Complaint  Patient presents with   Anxiety    Darlene Gates is a 37 y.o. female here for anxiety, substance abuse, homelessness, cluster B personality disorder who presents with concern for suicidal ideation reporting that she wants to take some pills and end her life.  She denies any recent previous suicide attempts.  She also reports that she is having a hard time finding a primary care doctor, is having worsening anxiety symptoms, and wants all of her medications refilled.  She reports that she has not taken anything in 2 years, and she cannot take tell me what she is supposed to be taking.   Anxiety       Home Medications Prior to Admission medications   Medication Sig Start Date End Date Taking? Authorizing Provider  albuterol (VENTOLIN HFA) 108 (90 Base) MCG/ACT inhaler Inhale 1-2 puffs into the lungs every 6 (six) hours as needed for wheezing or shortness of breath. 11/26/21   Mayers, Cari S, PA-C  amoxicillin-clavulanate (AUGMENTIN) 875-125 MG tablet Take 1 tablet by mouth every 12 (twelve) hours. 10/18/22   Karie Mainland, Amjad, PA-C  ARIPiprazole (ABILIFY) 5 MG tablet Take 1 tablet (5 mg total) by mouth daily. 01/25/22 04/25/22  Bahraini, Sarah A  azithromycin (ZITHROMAX Z-PAK) 250 MG tablet Take 2 tablets (500 mg total) by mouth daily for 1 day, THEN 1 tablet (250 mg total) daily for 4 days. 10/18/22 10/23/22  Fayrene Helper, MD  celecoxib (CELEBREX) 200 MG capsule Take 1 capsule (200 mg total) by mouth 2 (two) times daily. 01/23/22   Arthor Captain, PA-C  cyclobenzaprine (FLEXERIL) 10 MG tablet Take 0.5-1 tablets (5-10 mg total) by mouth 2 (two) times daily as needed for muscle spasms. 01/23/22   Harris, Cammy Copa, PA-C  dicyclomine (BENTYL) 20 MG tablet Take 1 tablet (20 mg total) by mouth every 6 (six) hours as needed. IBS 11/26/21    Mayers, Cari S, PA-C  famotidine (PEPCID) 20 MG tablet Take 1 tablet (20 mg total) by mouth 2 (two) times daily. 12/25/21   Zenia Resides, MD  gabapentin (NEURONTIN) 100 MG capsule Take 1 capsule (100 mg total) by mouth 3 (three) times daily as needed (anxiety/alcohol use cravings). 01/25/22 04/25/22  Bahraini, Sarah A  hydrOXYzine (ATARAX) 25 MG tablet Take 1 tablet (25 mg total) by mouth 3 (three) times daily as needed for anxiety. Patient not taking: Reported on 01/25/2022 11/26/21   Mayers, Cari S, PA-C  ondansetron (ZOFRAN-ODT) 4 MG disintegrating tablet Take 1 tablet (4 mg total) by mouth every 8 (eight) hours as needed for nausea or vomiting. 12/25/21   Zenia Resides, MD  venlafaxine XR (EFFEXOR XR) 75 MG 24 hr capsule Take 1 capsule (75 mg total) by mouth daily with breakfast. 01/25/22 04/25/22  Bahraini, Sarah A      Allergies    Bee pollen, Chlorpromazine, Other, Oxycodone-acetaminophen, Peanut oil, Pineapple, and Promethazine    Review of Systems   Review of Systems  Psychiatric/Behavioral:  Positive for agitation and dysphoric mood. The patient is nervous/anxious.   All other systems reviewed and are negative.   Physical Exam Updated Vital Signs BP 134/87   Pulse (!) 111   Temp 98.6 F (37 C) (Oral)   Resp 20   Ht 5\' 4"  (1.626 m)   Wt 81.6 kg   LMP 10/17/2022 (Exact Date)  SpO2 95%   BMI 30.90 kg/m  Physical Exam Vitals and nursing note reviewed.  Constitutional:      General: She is not in acute distress.    Appearance: Normal appearance.  HENT:     Head: Normocephalic and atraumatic.  Eyes:     General:        Right eye: No discharge.        Left eye: No discharge.  Cardiovascular:     Rate and Rhythm: Regular rhythm. Tachycardia present.     Heart sounds: No murmur heard.    No friction rub. No gallop.  Pulmonary:     Effort: Pulmonary effort is normal.     Breath sounds: Normal breath sounds.  Abdominal:     General: Bowel sounds are normal.      Palpations: Abdomen is soft.  Skin:    General: Skin is warm and dry.     Capillary Refill: Capillary refill takes less than 2 seconds.     Comments: Some warmth of skin surrounding laceration of left lower extremity, healing appropriately otherwise, sutures in place at this time  Neurological:     Mental Status: She is alert and oriented to person, place, and time.  Psychiatric:        Mood and Affect: Mood normal.        Behavior: Behavior normal.     ED Results / Procedures / Treatments   Labs (all labs ordered are listed, but only abnormal results are displayed) Labs Reviewed  ACETAMINOPHEN LEVEL - Abnormal; Notable for the following components:      Result Value   Acetaminophen (Tylenol), Serum <10 (*)    All other components within normal limits  COMPREHENSIVE METABOLIC PANEL - Abnormal; Notable for the following components:   Calcium 8.6 (*)    Total Protein 6.1 (*)    All other components within normal limits  CBC - Abnormal; Notable for the following components:   RBC 3.58 (*)    HCT 35.5 (*)    All other components within normal limits  ETHANOL - Abnormal; Notable for the following components:   Alcohol, Ethyl (B) 30 (*)    All other components within normal limits  SALICYLATE LEVEL - Abnormal; Notable for the following components:   Salicylate Lvl <7.0 (*)    All other components within normal limits  URINALYSIS, ROUTINE W REFLEX MICROSCOPIC - Abnormal; Notable for the following components:   Color, Urine COLORLESS (*)    Specific Gravity, Urine 1.002 (*)    All other components within normal limits  RAPID URINE DRUG SCREEN, HOSP PERFORMED - Abnormal; Notable for the following components:   Cocaine POSITIVE (*)    All other components within normal limits  HCG, SERUM, QUALITATIVE    EKG None  Radiology No results found.  Procedures Procedures    Medications Ordered in ED Medications  amoxicillin-clavulanate (AUGMENTIN) 875-125 MG per tablet 1  tablet (has no administration in time range)  hydrOXYzine (ATARAX) tablet 25 mg (has no administration in time range)    ED Course/ Medical Decision Making/ A&P                             Medical Decision Making Amount and/or Complexity of Data Reviewed Labs: ordered.  Risk Prescription drug management.   Patient is a 37 y.o. female  who presents to the emergency department for psychiatric complaint, cellulitis, need for medication refill.  Past Medical  History:  anxiety, substance abuse, homelessness, cluster B personality disorder  Physical Exam:  Some warmth of skin surrounding laceration of left lower extremity, healing appropriately otherwise, sutures in place at this time   Dysphoric mood, she is endorsed some hallucinations as well as passive suicidal ideation, although she reports she has considered a plan  Labs: Medical clearance labs ordered, with following pertinent results: UA unremarkable, UDS positive for cocaine, CMP unremarkable, CBC unremarkable, she has a mildly elevated ethanol level, negative pregnancy test, normal salicylate and acetaminophen level.  Cardiac monitoring: EKG obtained and interpreted by attending physician which shows: Normal sinus rhythm  Medications: I ordered medication including Augmentin for laceration, Atarax as needed for anxiety.  Disposition: Patient is otherwise medically cleared at this time pending medical clearance laboratory evaluation. Will consult TTS and appreciate their recommendations.   Final Clinical Impression(s) / ED Diagnoses Final diagnoses:  None    Rx / DC Orders ED Discharge Orders     None         [place] 10/22/22 1541    Rolan Bucco, MD 10/25/22 [phone]

## 2022-10-23 ENCOUNTER — Inpatient Hospital Stay
Admission: AD | Admit: 2022-10-23 | Discharge: 2022-10-28 | DRG: 885 | Disposition: A | Discharge status: Home / Self Care | Payer: MEDICAID | Source: Ambulatory Visit | Attending: Psychiatry | Admitting: Psychiatry

## 2022-10-23 ENCOUNTER — Encounter: Payer: Self-pay | Admitting: Nurse Practitioner

## 2022-10-23 DIAGNOSIS — F17210 Nicotine dependence, cigarettes, uncomplicated: Secondary | ICD-10-CM | POA: Diagnosis present

## 2022-10-23 DIAGNOSIS — F332 Major depressive disorder, recurrent severe without psychotic features: Secondary | ICD-10-CM | POA: Diagnosis not present

## 2022-10-23 DIAGNOSIS — G4700 Insomnia, unspecified: Secondary | ICD-10-CM | POA: Diagnosis present

## 2022-10-23 DIAGNOSIS — Z5982 Transportation insecurity: Secondary | ICD-10-CM

## 2022-10-23 DIAGNOSIS — Z79899 Other long term (current) drug therapy: Secondary | ICD-10-CM | POA: Diagnosis not present

## 2022-10-23 DIAGNOSIS — Z5941 Food insecurity: Secondary | ICD-10-CM | POA: Diagnosis not present

## 2022-10-23 DIAGNOSIS — F411 Generalized anxiety disorder: Secondary | ICD-10-CM | POA: Diagnosis present

## 2022-10-23 DIAGNOSIS — F1020 Alcohol dependence, uncomplicated: Secondary | ICD-10-CM | POA: Diagnosis present

## 2022-10-23 DIAGNOSIS — Z5900 Homelessness unspecified: Secondary | ICD-10-CM | POA: Diagnosis not present

## 2022-10-23 DIAGNOSIS — F10239 Alcohol dependence with withdrawal, unspecified: Secondary | ICD-10-CM | POA: Diagnosis present

## 2022-10-23 DIAGNOSIS — Z818 Family history of other mental and behavioral disorders: Secondary | ICD-10-CM | POA: Diagnosis not present

## 2022-10-23 DIAGNOSIS — F1494 Cocaine use, unspecified with cocaine-induced mood disorder: Secondary | ICD-10-CM | POA: Diagnosis present

## 2022-10-23 DIAGNOSIS — F1994 Other psychoactive substance use, unspecified with psychoactive substance-induced mood disorder: Secondary | ICD-10-CM | POA: Diagnosis present

## 2022-10-23 DIAGNOSIS — Z8249 Family history of ischemic heart disease and other diseases of the circulatory system: Secondary | ICD-10-CM

## 2022-10-23 DIAGNOSIS — Z91148 Patient's other noncompliance with medication regimen for other reason: Secondary | ICD-10-CM

## 2022-10-23 DIAGNOSIS — G2401 Drug induced subacute dyskinesia: Secondary | ICD-10-CM | POA: Diagnosis present

## 2022-10-23 DIAGNOSIS — R45851 Suicidal ideations: Secondary | ICD-10-CM | POA: Diagnosis present

## 2022-10-23 DIAGNOSIS — Z5986 Financial insecurity: Secondary | ICD-10-CM

## 2022-10-23 DIAGNOSIS — F333 Major depressive disorder, recurrent, severe with psychotic symptoms: Secondary | ICD-10-CM | POA: Diagnosis present

## 2022-10-23 MED ORDER — LORAZEPAM 2 MG/ML IJ SOLN: 2.0000 mg | Freq: Three times a day (TID) | INTRAMUSCULAR | Status: DC | PRN

## 2022-10-23 MED ORDER — OLANZAPINE 5 MG PO TBDP: 5.0000 mg | ORAL_TABLET | Freq: Every day | ORAL | Status: DC

## 2022-10-23 MED ORDER — IBUPROFEN 200 MG PO TABS
400.0000 mg | ORAL_TABLET | Freq: Four times a day (QID) | ORAL | Status: DC | PRN
  Administered 2022-10-23 – 2022-10-26 (×5): 400 mg via ORAL
  Filled 2022-10-23 (×5): qty 2

## 2022-10-23 MED ORDER — ALUM & MAG HYDROXIDE-SIMETH 200-200-20 MG/5ML PO SUSP: 30.0000 mL | ORAL | Status: DC | PRN

## 2022-10-23 MED ORDER — ARIPIPRAZOLE 5 MG PO TABS
5.0000 mg | ORAL_TABLET | Freq: Every day | ORAL | Status: DC
  Administered 2022-10-23 – 2022-10-28 (×6): 5 mg via ORAL
  Filled 2022-10-23 (×6): qty 1

## 2022-10-23 MED ORDER — ADULT MULTIVITAMIN W/MINERALS CH
1.0000 | ORAL_TABLET | Freq: Every day | ORAL | Status: DC
  Administered 2022-10-23 – 2022-10-28 (×6): 1 via ORAL
  Filled 2022-10-23 (×6): qty 1

## 2022-10-23 MED ORDER — GABAPENTIN 100 MG PO CAPS
100.0000 mg | ORAL_CAPSULE | Freq: Three times a day (TID) | ORAL | Status: DC
  Administered 2022-10-23 – 2022-10-24 (×4): 100 mg via ORAL
  Filled 2022-10-23 (×4): qty 1

## 2022-10-23 MED ORDER — LORAZEPAM 2 MG PO TABS
2.0000 mg | ORAL_TABLET | Freq: Three times a day (TID) | ORAL | Status: DC | PRN
  Administered 2022-10-23 – 2022-10-28 (×5): 2 mg via ORAL
  Filled 2022-10-23 (×5): qty 1

## 2022-10-23 MED ORDER — THIAMINE MONONITRATE 100 MG PO TABS
100.0000 mg | ORAL_TABLET | Freq: Every day | ORAL | Status: DC
  Administered 2022-10-23 – 2022-10-28 (×6): 100 mg via ORAL
  Filled 2022-10-23 (×6): qty 1

## 2022-10-23 MED ORDER — TRAZODONE HCL 50 MG PO TABS
50.0000 mg | ORAL_TABLET | Freq: Every evening | ORAL | Status: DC | PRN
  Administered 2022-10-26: 50 mg via ORAL
  Filled 2022-10-23 (×2): qty 1

## 2022-10-23 MED ORDER — MAGNESIUM HYDROXIDE 400 MG/5ML PO SUSP: 30.0000 mL | Freq: Every day | ORAL | Status: DC | PRN

## 2022-10-23 MED ORDER — LORAZEPAM 1 MG PO TABS: 1.0000 mg | ORAL_TABLET | Freq: Four times a day (QID) | ORAL | Status: AC | PRN

## 2022-10-23 MED ORDER — HYDROXYZINE HCL 25 MG PO TABS
25.0000 mg | ORAL_TABLET | Freq: Four times a day (QID) | ORAL | Status: AC | PRN
  Filled 2022-10-23: qty 1

## 2022-10-23 MED ORDER — HYDROXYZINE HCL 25 MG PO TABS: 25.0000 mg | ORAL_TABLET | Freq: Three times a day (TID) | ORAL | Status: DC | PRN

## 2022-10-23 MED ORDER — VALBENAZINE TOSYLATE 40 MG PO CAPS
40.0000 mg | ORAL_CAPSULE | Freq: Every day | ORAL | Status: DC
  Administered 2022-10-23 – 2022-10-28 (×6): 40 mg via ORAL
  Filled 2022-10-23 (×6): qty 1

## 2022-10-23 MED ORDER — NICOTINE 21 MG/24HR TD PT24
21.0000 mg | MEDICATED_PATCH | Freq: Every day | TRANSDERMAL | Status: DC
  Administered 2022-10-24 – 2022-10-28 (×5): 21 mg via TRANSDERMAL
  Filled 2022-10-23 (×5): qty 1

## 2022-10-23 MED ORDER — ONDANSETRON 4 MG PO TBDP
4.0000 mg | ORAL_TABLET | Freq: Four times a day (QID) | ORAL | Status: AC | PRN
  Administered 2022-10-23: 4 mg via ORAL
  Filled 2022-10-23 (×3): qty 1

## 2022-10-23 MED ORDER — LOPERAMIDE HCL 2 MG PO CAPS: 2.0000 mg | ORAL_CAPSULE | ORAL | Status: AC | PRN

## 2022-10-23 MED ORDER — VENLAFAXINE HCL ER 37.5 MG PO CP24
37.5000 mg | ORAL_CAPSULE | Freq: Every day | ORAL | Status: DC
  Administered 2022-10-23 – 2022-10-28 (×6): 37.5 mg via ORAL
  Filled 2022-10-23 (×6): qty 1

## 2022-10-23 MED ORDER — OLANZAPINE 5 MG PO TABS: 5.0000 mg | ORAL_TABLET | Freq: Every day | ORAL | Status: DC
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
## 2022-10-23 NOTE — Group Note (Signed)
Date:  10/23/2022 Time:  4:06 PM  Group Topic/Focus:  OUTDOOR RECREATION    Participation Level:  Did Not Attend   Darlene Gates 10/23/2022, 4:06 PM

## 2022-10-23 NOTE — Plan of Care (Signed)
Patient new to the unit tonight, hasn't had time to progress  Problem: Education: Goal: Knowledge of General Education information will improve Description: Including pain rating scale, medication(s)/side effects and non-pharmacologic comfort measures Outcome: Not Progressing   Problem: Health Behavior/Discharge Planning: Goal: Ability to manage health-related needs will improve Outcome: Not Progressing   Problem: Clinical Measurements: Goal: Ability to maintain clinical measurements within normal limits will improve Outcome: Not Progressing Goal: Will remain free from infection Outcome: Not Progressing Goal: Diagnostic test results will improve Outcome: Not Progressing Goal: Respiratory complications will improve Outcome: Not Progressing Goal: Cardiovascular complication will be avoided Outcome: Not Progressing   Problem: Activity: Goal: Risk for activity intolerance will decrease Outcome: Not Progressing   Problem: Nutrition: Goal: Adequate nutrition will be maintained Outcome: Not Progressing   Problem: Coping: Goal: Level of anxiety will decrease Outcome: Not Progressing   Problem: Elimination: Goal: Will not experience complications related to bowel motility Outcome: Not Progressing Goal: Will not experience complications related to urinary retention Outcome: Not Progressing   Problem: Pain Managment: Goal: General experience of comfort will improve Outcome: Not Progressing   Problem: Safety: Goal: Ability to remain free from injury will improve Outcome: Not Progressing   Problem: Skin Integrity: Goal: Risk for impaired skin integrity will decrease Outcome: Not Progressing   Problem: Education: Goal: Knowledge of [place] General Education information/materials will improve Outcome: Not Progressing Goal: Emotional status will improve Outcome: Not Progressing Goal: Mental status will improve Outcome: Not Progressing Goal: Verbalization of  understanding the information provided will improve Outcome: Not Progressing   Problem: Activity: Goal: Interest or engagement in activities will improve Outcome: Not Progressing Goal: Sleeping patterns will improve Outcome: Not Progressing   Problem: Coping: Goal: Ability to verbalize frustrations and anger appropriately will improve Outcome: Not Progressing Goal: Ability to demonstrate self-control will improve Outcome: Not Progressing   Problem: Health Behavior/Discharge Planning: Goal: Identification of resources available to assist in meeting health care needs will improve Outcome: Not Progressing Goal: Compliance with treatment plan for underlying cause of condition will improve Outcome: Not Progressing   Problem: Physical Regulation: Goal: Ability to maintain clinical measurements within normal limits will improve Outcome: Not Progressing   Problem: Safety: Goal: Periods of time without injury will increase Outcome: Not Progressing
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
## 2022-10-23 NOTE — BH IP Treatment Plan (Signed)
Interdisciplinary Treatment and Diagnostic Plan Update  10/23/2022 Time of Session: 09:19 Darlene Gates MRN: [id number]  Principal Diagnosis: Substance induced mood disorder (HCC)  Secondary Diagnoses: Principal Problem:   Substance induced mood disorder (HCC)   Current Medications:  Current Facility-Administered Medications  Medication Dose Route Frequency Provider Last Rate Last Admin   alum & mag hydroxide-simeth (MAALOX/MYLANTA) 200-200-20 MG/5ML suspension 30 mL  30 mL Oral Q4H PRN Onuoha, Chinwendu V, NP       ARIPiprazole (ABILIFY) tablet 5 mg  5 mg Oral Daily Lauree Chandler, NP   5 mg at 10/23/22 2130   gabapentin (NEURONTIN) capsule 100 mg  100 mg Oral TID Lauree Chandler, NP   100 mg at 10/23/22 8657   hydrOXYzine (ATARAX) tablet 25 mg  25 mg Oral Q6H PRN Lauree Chandler, NP       loperamide (IMODIUM) capsule 2-4 mg  2-4 mg Oral PRN Lauree Chandler, NP       LORazepam (ATIVAN) tablet 2 mg  2 mg Oral TID PRN Onuoha, Chinwendu V, NP       Or   LORazepam (ATIVAN) injection 2 mg  2 mg Intramuscular TID PRN Onuoha, Chinwendu V, NP       LORazepam (ATIVAN) tablet 1 mg  1 mg Oral Q6H PRN Lauree Chandler, NP       magnesium hydroxide (MILK OF MAGNESIA) suspension 30 mL  30 mL Oral Daily PRN Onuoha, Chinwendu V, NP       multivitamin with minerals tablet 1 tablet  1 tablet Oral Daily Lauree Chandler, NP   1 tablet at 10/23/22 0839   ondansetron (ZOFRAN-ODT) disintegrating tablet 4 mg  4 mg Oral Q6H PRN Lauree Chandler, NP       thiamine (VITAMIN B1) tablet 100 mg  100 mg Oral Daily Lauree Chandler, NP   100 mg at 10/23/22 0838   traZODone (DESYREL) tablet 50 mg  50 mg Oral QHS PRN Onuoha, Chinwendu V, NP       valbenazine (INGREZZA) capsule 40 mg  40 mg Oral Daily Lauree Chandler, NP   40 mg at 10/23/22 8469   venlafaxine XR (EFFEXOR-XR) 24 hr capsule 37.5 mg  37.5 mg Oral Q breakfast Onuoha, Chinwendu V, NP   37.5 mg at 10/23/22 6295   PTA  Medications: Medications Prior to Admission  Medication Sig Dispense Refill Last Dose   acetaminophen (TYLENOL) 500 MG tablet Take 1,000 mg by mouth 2 (two) times daily as needed for moderate pain, fever or headache.      amoxicillin-clavulanate (AUGMENTIN) 875-125 MG tablet Take 1 tablet by mouth every 12 (twelve) hours. 14 tablet 0    azithromycin (ZITHROMAX Z-PAK) 250 MG tablet Take 2 tablets (500 mg total) by mouth daily for 1 day, THEN 1 tablet (250 mg total) daily for 4 days. 6 tablet 0    Prenatal MV & Min w/FA-DHA (PRENATAL ADULT GUMMY/DHA/FA PO) Take 1 each by mouth daily.       Patient Stressors: Medication change or noncompliance   Substance abuse    Patient Strengths: Ability for insight  Motivation for treatment/growth   Treatment Modalities: Medication Management, Group therapy, Case management,  1 to 1 session with clinician, Psychoeducation, Recreational therapy.   Physician Treatment Plan for Primary Diagnosis: Substance induced mood disorder (HCC) Long Term Goal(s):     Short Term Goals:    Medication Management: Evaluate patient's response, side effects, and tolerance of medication regimen.  Therapeutic Interventions: 1 to 1 sessions, Unit Group sessions and Medication administration.  Evaluation of Outcomes: Not Met  Physician Treatment Plan for Secondary Diagnosis: Principal Problem:   Substance induced mood disorder (HCC)  Long Term Goal(s):     Short Term Goals:       Medication Management: Evaluate patient's response, side effects, and tolerance of medication regimen.  Therapeutic Interventions: 1 to 1 sessions, Unit Group sessions and Medication administration.  Evaluation of Outcomes: Not Met   RN Treatment Plan for Primary Diagnosis: Substance induced mood disorder (HCC) Long Term Goal(s): Knowledge of disease and therapeutic regimen to maintain health will improve  Short Term Goals: Ability to remain free from injury will improve, Ability to  verbalize frustration and anger appropriately will improve, Ability to demonstrate self-control, Ability to participate in decision making will improve, Ability to verbalize feelings will improve, Ability to disclose and discuss suicidal ideas, Ability to identify and develop effective coping behaviors will improve, and Compliance with prescribed medications will improve  Medication Management: RN will administer medications as ordered by provider, will assess and evaluate patient's response and provide education to patient for prescribed medication. RN will report any adverse and/or side effects to prescribing provider.  Therapeutic Interventions: 1 on 1 counseling sessions, Psychoeducation, Medication administration, Evaluate responses to treatment, Monitor vital signs and CBGs as ordered, Perform/monitor CIWA, COWS, AIMS and Fall Risk screenings as ordered, Perform wound care treatments as ordered.  Evaluation of Outcomes: Not Met   LCSW Treatment Plan for Primary Diagnosis: Substance induced mood disorder (HCC) Long Term Goal(s): Safe transition to appropriate next level of care at discharge, Engage patient in therapeutic group addressing interpersonal concerns.  Short Term Goals: Engage patient in aftercare planning with referrals and resources, Increase social support, Increase ability to appropriately verbalize feelings, Increase emotional regulation, Facilitate acceptance of mental health diagnosis and concerns, Facilitate patient progression through stages of change regarding substance use diagnoses and concerns, Identify triggers associated with mental health/substance abuse issues, and Increase skills for wellness and recovery  Therapeutic Interventions: Assess for all discharge needs, 1 to 1 time with Social worker, Explore available resources and support systems, Assess for adequacy in community support network, Educate family and significant other(s) on suicide prevention, Complete  Psychosocial Assessment, Interpersonal group therapy.  Evaluation of Outcomes: Not Met   Progress in Treatment: Attending groups: No. Participating in groups: No. Taking medication as prescribed: Yes. Toleration medication: Yes. Family/Significant other contact made: No, will contact:  when given permission. Patient understands diagnosis: Yes. Discussing patient identified problems/goals with staff: Yes. Medical problems stabilized or resolved: Yes. Denies suicidal/homicidal ideation: Yes. Issues/concerns per patient self-inventory: No. Other: none.  New problem(s) identified: No, Describe:  none identified.  New Short Term/Long Term Goal(s): detox, elimination of symptoms of psychosis, medication management for mood stabilization; elimination of SI thoughts; development of comprehensive mental wellness/sobriety plan.  Patient Goals:  "Get regulated back on my medication."  Discharge Plan or Barriers: CSW will assist pt with development of an appropriate aftercare/discharge plan.   Reason for Continuation of Hospitalization: Depression Medication stabilization Suicidal ideation  Estimated Length of Stay: 1-7 days  Last 3 [place] Suicide Severity Risk Score: Flowsheet Row Admission (Current) from 10/23/2022 in [facility] INPATIENT BEHAVIORAL MEDICINE ED from 10/22/2022 in [facility] Emergency Department at [facility] ED from 10/18/2022 in [facility] Emergency Department at [facility]  C-SSRS RISK CATEGORY No Risk No Risk No Risk       Last [facility] 2/9 Scores:  01/09/2022    2:12 PM 11/12/2021    3:10 PM 09/11/2021   10:43 AM  Depression screen PHQ 2/9  Decreased Interest 3 3 3   Down, Depressed, Hopeless 2 1 3   PHQ - 2 Score 5 4 6   Altered sleeping 3 3 2   Tired, decreased energy 3 3 3   Change in appetite 2 2 2   Feeling bad or failure about yourself  2 2 3   Trouble concentrating 3 3 3   Moving slowly or fidgety/restless 2 2 3   Suicidal thoughts 0 0 0  PHQ-9  Score 20 19 22   Difficult doing work/chores Very difficult  Very difficult    Scribe for Treatment Team: Glenis Smoker, LCSW 10/23/2022 10:02 AM

## 2022-10-23 NOTE — Progress Notes (Signed)
Suicide Risk Assessment  Admission Assessment    [facility] Admission Suicide Risk Assessment   Nursing information obtained from:  Patient Demographic factors:  Low socioeconomic status Current Mental Status:  NA Loss Factors:  Decrease in vocational status, Financial problems / change in socioeconomic status Historical Factors:  Domestic violence Risk Reduction Factors:  NA  Total Time spent with patient: 1 hour Principal Problem: Substance induced mood disorder (HCC) Diagnosis:  Principal Problem:   Substance induced mood disorder (HCC)  Subjective Data: Patient is a 37 y/o female  with history of unspecified mood and psychotic disorder, alcohol use disorder, cocaine use, tardive dyskinesia admitted to [facility] inpatient psychiatry as a transfer from [facility] for SI with plan to overdose.   Continued Clinical Symptoms:  Alcohol Use Disorder Identification Test Final Score (AUDIT): 7 The "Alcohol Use Disorders Identification Test", Guidelines for Use in Primary Care, Second Edition.  World Science writer [facility]). Score between 0-7:  no or low risk or alcohol related problems. Score between 8-15:  moderate risk of alcohol related problems. Score between 16-19:  high risk of alcohol related problems. Score 20 or above:  warrants further diagnostic evaluation for alcohol dependence and treatment.   CLINICAL FACTORS:  Depression:   Comorbid alcohol abuse/dependence Previous Psychiatric Diagnoses and Treatments   Musculoskeletal: Strength & Muscle Tone: within normal limits Gait & Station: normal Patient leans: N/A  Psychiatric Specialty Exam:  Presentation  General Appearance:  Appropriate for Environment  Eye Contact: Fair  Speech: Clear and Coherent; Normal Rate  Speech Volume: Normal  Handedness:Right   Mood and Affect  Mood: Irritable; Depressed; Anxious  Affect: Flat   Thought Process  Thought Processes: Coherent; Goal Directed; Linear  Descriptions of  Associations:Intact  Orientation:Full (Time, Place and Person)  Thought Content:Logical  History of Schizophrenia/Schizoaffective disorder:No data recorded Duration of Psychotic Symptoms:No data recorded Hallucinations:Hallucinations: None  Ideas of Reference:None  Suicidal Thoughts:Suicidal Thoughts: No  Homicidal Thoughts:Homicidal Thoughts: No   Sensorium  Memory: Immediate Fair  Judgment: Fair  Insight: Fair   Art therapist  Concentration: Fair  Attention Span: Fair  Recall: [employer] of Knowledge: Fair  Language: Fair   Psychomotor Activity  Psychomotor Activity: Psychomotor Activity: Normal   Assets  Assets: Communication Skills; Desire for Improvement; Financial Resources/Insurance; Resilience   Sleep  Sleep: Sleep: Poor  Physical Exam: Physical Exam see admission note ROS see admission note Blood pressure (!) 130/93, pulse 81, temperature 98.1 F (36.7 C), resp. rate 15, height 5\' 4"  (1.626 m), weight 81.6 kg, last menstrual period 10/17/2022, SpO2 100%, unknown if currently breastfeeding. Body mass index is 30.9 kg/m.   COGNITIVE FEATURES THAT CONTRIBUTE TO RISK:  None    SUICIDE RISK:  Mild:  Suicidal ideation of limited frequency, intensity, duration, and specificity.  There are no identifiable plans, no associated intent, mild dysphoria and related symptoms, good self-control (both objective and subjective assessment), few other risk factors, and identifiable protective factors, including available and accessible social support.  PLAN OF CARE:  Daily contact with patient Medication management  I certify that inpatient services furnished can reasonably be expected to improve the patient's condition.   Lauree Chandler, NP 10/23/2022, 4:00 PM

## 2022-10-23 NOTE — Progress Notes (Signed)
Patient calm and pleasant during assessment denying SI/HI/AVH. Pt observed interacting appropriately with staff and peers on the unit. Pt didn't have any scheduled medication tonight and hasn't requested anything PRN as of now. Pt given education, support, and encouragement to be active in her treatment plan. Pt being monitored Q 15 minutes for safety per unit protocol, remains safe on the unit

## 2022-10-23 NOTE — Plan of Care (Signed)
Pt denies SI/HI/AVH, Pt observed interacting appropriately with staff and peers on the unit by this writer  Problem: Education: Goal: Knowledge of General Education information will improve Description: Including pain rating scale, medication(s)/side effects and non-pharmacologic comfort measures Outcome: Progressing   Problem: Health Behavior/Discharge Planning: Goal: Ability to manage health-related needs will improve Outcome: Progressing   Problem: Clinical Measurements: Goal: Ability to maintain clinical measurements within normal limits will improve Outcome: Progressing Goal: Will remain free from infection Outcome: Progressing Goal: Diagnostic test results will improve Outcome: Progressing Goal: Respiratory complications will improve Outcome: Progressing Goal: Cardiovascular complication will be avoided Outcome: Progressing   Problem: Activity: Goal: Risk for activity intolerance will decrease Outcome: Progressing   Problem: Nutrition: Goal: Adequate nutrition will be maintained Outcome: Progressing   Problem: Coping: Goal: Level of anxiety will decrease Outcome: Progressing   Problem: Elimination: Goal: Will not experience complications related to bowel motility Outcome: Progressing Goal: Will not experience complications related to urinary retention Outcome: Progressing   Problem: Pain Managment: Goal: General experience of comfort will improve Outcome: Progressing   Problem: Safety: Goal: Ability to remain free from injury will improve Outcome: Progressing   Problem: Skin Integrity: Goal: Risk for impaired skin integrity will decrease Outcome: Progressing   Problem: Education: Goal: Knowledge of [place] [facility] Education information/materials will improve Outcome: Progressing Goal: Emotional status will improve Outcome: Progressing Goal: Mental status will improve Outcome: Progressing Goal: Verbalization of understanding the information  provided will improve Outcome: Progressing   Problem: Activity: Goal: Interest or engagement in activities will improve Outcome: Progressing Goal: Sleeping patterns will improve Outcome: Progressing   Problem: Coping: Goal: Ability to verbalize frustrations and anger appropriately will improve Outcome: Progressing Goal: Ability to demonstrate self-control will improve Outcome: Progressing   Problem: Health Behavior/Discharge Planning: Goal: Identification of resources available to assist in meeting health care needs will improve Outcome: Progressing Goal: Compliance with treatment plan for underlying cause of condition will improve Outcome: Progressing   Problem: Physical Regulation: Goal: Ability to maintain clinical measurements within normal limits will improve Outcome: Progressing   Problem: Safety: Goal: Periods of time without injury will increase Outcome: Progressing

## 2022-10-23 NOTE — Tx Team (Signed)
Initial Treatment Plan 10/23/2022 5:00 AM Darlene Gates ONG:[id number]    PATIENT STRESSORS: Medication change or noncompliance   Substance abuse     PATIENT STRENGTHS: Ability for insight  Motivation for treatment/growth    PATIENT IDENTIFIED PROBLEMS: Suicidal Ideation  Anxiety  Depression                 DISCHARGE CRITERIA:  Improved stabilization in mood, thinking, and/or behavior Verbal commitment to aftercare and medication compliance  PRELIMINARY DISCHARGE PLAN: Outpatient therapy Placement in alternative living arrangements  PATIENT/FAMILY INVOLVEMENT: This treatment plan has been presented to and reviewed with the patient, Darlene Gates. The patient has been given the opportunity to ask questions and make suggestions.  Elmyra Ricks, RN 10/23/2022, 5:00 AM

## 2022-10-23 NOTE — Group Note (Signed)
Recreation Therapy Group Note   Group Topic:Emotion Expression  Group Date: 10/23/2022 Start Time: 1000 End Time: 1045 Facilitators: Rosina Lowenstein, LRT, CTRS Location:  Craft Room  Group Description: Gratitude Journaling. Patients and LRT discussed what gratitude means, how we can express it and what it means to [place], personally. LRT gave an education handout on the definition of gratitude that also gave different examples of gratitude exercises that they could try. One of the examples was "Gratitude Letter", which prompted you to write a letter to someone you appreciate. LRT played soft music while everyone wrote their letter. Once letter was completed, LRT encouraged people to read their letter, if they wanted to, or share who they wrote it to, at minimum. LRT and pts processed how showing gratitude towards themselves, and others can be applied to everyday life post-discharge.   Goal Area(s) Addressed:  Patient will identify the definition of gratitude. Patient will learn different gratitude exercises. Patient will practice writing/journaling as a coping skill.    Affect/Mood: N/A   Participation Level: Did not attend    Clinical Observations/Individualized Feedback: Darlene Gates did not attend group.  Plan: Continue to engage patient in RT group sessions 2-3x/week.   Rosina Lowenstein, LRT, CTRS 10/23/2022 11:33 AM

## 2022-10-23 NOTE — Progress Notes (Signed)
Patient noted vomiting in the bath room and verbalized nausea. Patient verbalized that her anxiety is 6/10. Ativan 2mg  given as per CIWA. Patient up for dinner. No nausea verbalized. Patient denies SI,HI and AVH. Encouraged fluids. Support and encouragement given.

## 2022-10-24 ENCOUNTER — Telehealth: Payer: Self-pay | Admitting: Plastic Surgery

## 2022-10-24 ENCOUNTER — Ambulatory Visit: Payer: MEDICAID | Admitting: Plastic Surgery

## 2022-10-24 MED ORDER — GABAPENTIN 100 MG PO CAPS
200.0000 mg | ORAL_CAPSULE | Freq: Three times a day (TID) | ORAL | Status: DC
  Administered 2022-10-24 – 2022-10-28 (×13): 200 mg via ORAL
  Filled 2022-10-24 (×13): qty 2

## 2022-10-24 MED ORDER — AMOXICILLIN-POT CLAVULANATE 875-125 MG PO TABS
1.0000 | ORAL_TABLET | Freq: Two times a day (BID) | ORAL | Status: AC
  Administered 2022-10-24 – 2022-10-28 (×8): 1 via ORAL
  Filled 2022-10-24 (×9): qty 1
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
## 2022-10-24 NOTE — Group Note (Signed)
[facility] LCSW Group Therapy Note   Group Date: 10/24/2022 Start Time: 1330 End Time: 1430   Type of Therapy/Topic:  Group Therapy:  Emotion Regulation  Participation Level:  Did Not Attend   Mood:  Description of Group:    The purpose of this group is to assist patients in learning to regulate negative emotions and experience positive emotions. Patients will be guided to discuss ways in which they have been vulnerable to their negative emotions. These vulnerabilities will be juxtaposed with experiences of positive emotions or situations, and patients challenged to use positive emotions to combat negative ones. Special emphasis will be placed on coping with negative emotions in conflict situations, and patients will process healthy conflict resolution skills.  Therapeutic Goals: Patient will identify two positive emotions or experiences to reflect on in order to balance out negative emotions:  Patient will label two or more emotions that they find the most difficult to experience:  Patient will be able to demonstrate positive conflict resolution skills through discussion or role plays:   Summary of Patient Progress: Patient not yet on unit at this time.    Therapeutic Modalities:   Cognitive Behavioral Therapy Feelings Identification Dialectical Behavioral Therapy   Harden Mo, LCSW

## 2022-10-24 NOTE — Group Note (Signed)
Date:  10/24/2022 Time:  8:57 PM  Group Topic/Focus:  Goals Group:   The focus of this group is to help patients establish daily goals to achieve during treatment and discuss how the patient can incorporate goal setting into their daily lives to aide in recovery.    Participation Level:  Did Not Attend   Lenore Cordia 10/24/2022, 8:57 PM

## 2022-10-24 NOTE — Plan of Care (Signed)
D- Patient alert and oriented. Pt is isolative to her room and has stayed in bed all day. Denies SI, HI, AVH, and pain. A- Scheduled medications administered to patient, per MD orders. Support and encouragement provided.  Routine safety checks conducted every 15 minutes.  Patient informed to notify staff with problems or concerns. R- No adverse drug reactions noted. Patient contracts for safety at this time. Patient compliant with medications and treatment plan. Patient receptive, calm, and cooperative. Patient interacts well with others on the unit.  Patient remains safe at this time.

## 2022-10-24 NOTE — Group Note (Signed)
Recreation Therapy Group Note   Group Topic:Relaxation  Group Date: 10/24/2022 Start Time: 1000 End Time: 1050 Facilitators: Rosina Lowenstein, LRT, CTRS Location:  C[facility] Room  Group Description: PMR (Progressive Muscle Relaxation). LRT asks patients their current level of stress/anxiety from 1-10, with 10 being the highest. LRT educates patients on what PMR is and the benefits that come from it. Patients are asked to sit with their feet flat on the floor while sitting up and all the way back in their chair, if possible. LRT and pts follow a prompt through a speaker that requires you to tense and release different muscles in their body and focus on their breathing. During session, lights are off and soft music is being played. Pts are given a stress ball to use in case sitting still is difficult for them and gives them something to fidget with. At the end of the prompt, LRT asks patients to rank their current levels of stress/anxiety from 1-10, 10 being the highest. LRT gives all pts an educational handout on the benefits of PMR as well as a step-by-step guide.   Goal Area(s) Addressed:  Patients will be able to describe progressive muscle relaxation.  Patient will practice using relaxation technique. Patient will identify a new coping skill.  Patient will follow multistep directions to reduce anxiety and stress.    Affect/Mood: N/A   Participation Level: Did not attend    Clinical Observations/Individualized Feedback: Darlene Gates did not attend group.  Plan: Continue to engage patient in RT group sessions 2-3x/week.   Rosina Lowenstein, LRT, CTRS 10/24/2022 11:56 AM

## 2022-10-24 NOTE — [facility] Suicide Risk Assessment (Signed)
[facility] INPATIENT:  Family/Significant Other Suicide Prevention Education  Suicide Prevention Education:  Patient Refusal for Family/Significant Other Suicide Prevention Education: The patient Darlene Gates has refused to provide written consent for family/significant other to be provided Family/Significant Other Suicide Prevention Education during admission and/or prior to discharge.  Physician notified.  SPE completed with pt, as pt refused to consent to family contact. SPI pamphlet provided to pt and pt was encouraged to share information with support network, ask questions, and talk about any concerns relating to SPE. Pt denies access to guns/firearms and verbalized understanding of information provided. Mobile Crisis information also provided to pt.  Glenis Smoker 10/24/2022, 3:48 PM
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
## 2022-10-24 NOTE — Group Note (Signed)
Date:  10/24/2022 Time:  9:00 AM  Group Topic/Focus:  Goals Group:   The focus of this group is to help patients establish daily goals to achieve during treatment and discuss how the patient can incorporate goal setting into their daily lives to aide in recovery.  Community Group    Participation Level:  Did Not Attend  Arietta Eisenstein A Delos Klich 10/24/2022, 12:37 PM

## 2022-10-24 NOTE — Telephone Encounter (Signed)
Pt called and said she missed her appt this morning because she is in the hospital in [place].  She expects to be released this Friday or Saturday. I was trying to schedule a new  appt for next week, but your schedule is full.  Can I schedule this with a PA or would you like to see her?  She can be reached at [phone] Code: [phone]

## 2022-10-25 DIAGNOSIS — F332 Major depressive disorder, recurrent severe without psychotic features: Principal | ICD-10-CM | POA: Diagnosis present

## 2022-10-25 DIAGNOSIS — F333 Major depressive disorder, recurrent, severe with psychotic symptoms: Secondary | ICD-10-CM | POA: Diagnosis present

## 2022-10-25 NOTE — Telephone Encounter (Signed)
Per Joss, I called pt and told her she would have to be seen by Dr. and not a PA.  She was not confident she would be able to make an appt on Monday, 10/28/22, so I scheduled her for 11/20/22 @ 8:15am.

## 2022-10-25 NOTE — Plan of Care (Signed)
  Problem: Pain Managment: Goal: General experience of comfort will improve Outcome: Progressing  Pain managed with medication and resting.

## 2022-10-25 NOTE — Progress Notes (Signed)
Patient calm and pleasant during assessment denying SI/HI/AVH. Pt observed interacting appropriately with staff and peers on the unit. Pt compliant with medication administration per MD orders. Pt given education, support, and encouragement to be active in her treatment plan. Pt being monitored Q 15 minutes for safety per unit protocol, remains safe on the unit   

## 2022-10-25 NOTE — Group Note (Signed)
Recreation Therapy Group Note   Group Topic:Leisure Education  Group Date: 10/25/2022 Start Time: 1000 End Time: 1100 Facilitators: Rosina Lowenstein, LRT, CTRS Location:  Craft  Room  Group Description: Leisure. Patients were given the option to choose from coloring mandalas, singing karaoke, making origami, painting or listening to music. LRT and pts discussed the meaning of leisure, the importance of participating in leisure during their free time/when they're outside of the hospital, as well as how our leisure interests can also serve as coping skills. Pt identified two leisure interests and shared with the group.   Goal Area(s) Addressed:  Patient will identify a current leisure interest.  Patient will learn the definition of "leisure". Patient will practice making a positive decision. Patient will have the opportunity to try a new leisure activity. Patient will communicate with peers and LRT.     Affect/Mood: N/A   Participation Level: Did not attend    Clinical Observations/Individualized Feedback: Darlene Gates did not attend group.  Plan: Continue to engage patient in RT group sessions 2-3x/week.   Rosina Lowenstein, LRT, CTRS 10/25/2022 11:54 AM

## 2022-10-25 NOTE — Group Note (Signed)
Date:  10/25/2022 Time:  9:00 AM  Group Topic/Focus:  Goals Group:   The focus of this group is to help patients establish daily goals to achieve during treatment and discuss how the patient can incorporate goal setting into their daily lives to aide in recovery.  Community Group   Participation Level:  Did Not Attend  Darlene Gates Darlene Gates 10/25/2022, 10:37 AM

## 2022-10-25 NOTE — Telephone Encounter (Signed)
Darlene Gates never seen her so I should probably keep her in my clinic

## 2022-10-25 NOTE — Plan of Care (Signed)
Patient denies SI/HI/AVH, calm and cooperative. Pt compliant with procedures of the unit  Problem: Education: Goal: Knowledge of General Education information will improve Description: Including pain rating scale, medication(s)/side effects and non-pharmacologic comfort measures Outcome: Progressing   Problem: Health Behavior/Discharge Planning: Goal: Ability to manage health-related needs will improve Outcome: Progressing   Problem: Clinical Measurements: Goal: Ability to maintain clinical measurements within normal limits will improve Outcome: Progressing Goal: Will remain free from infection Outcome: Progressing Goal: Diagnostic test results will improve Outcome: Progressing Goal: Respiratory complications will improve Outcome: Progressing Goal: Cardiovascular complication will be avoided Outcome: Progressing   Problem: Activity: Goal: Risk for activity intolerance will decrease Outcome: Progressing   Problem: Nutrition: Goal: Adequate nutrition will be maintained Outcome: Progressing   Problem: Coping: Goal: Level of anxiety will decrease Outcome: Progressing   Problem: Elimination: Goal: Will not experience complications related to bowel motility Outcome: Progressing Goal: Will not experience complications related to urinary retention Outcome: Progressing   Problem: Pain Managment: Goal: General experience of comfort will improve Outcome: Progressing   Problem: Safety: Goal: Ability to remain free from injury will improve Outcome: Progressing   Problem: Skin Integrity: Goal: Risk for impaired skin integrity will decrease Outcome: Progressing   Problem: Education: Goal: Knowledge of [place] [facility] Education information/materials will improve Outcome: Progressing Goal: Emotional status will improve Outcome: Progressing Goal: Mental status will improve Outcome: Progressing Goal: Verbalization of understanding the information provided will  improve Outcome: Progressing   Problem: Activity: Goal: Interest or engagement in activities will improve Outcome: Progressing Goal: Sleeping patterns will improve Outcome: Progressing   Problem: Coping: Goal: Ability to verbalize frustrations and anger appropriately will improve Outcome: Progressing Goal: Ability to demonstrate self-control will improve Outcome: Progressing   Problem: Health Behavior/Discharge Planning: Goal: Identification of resources available to assist in meeting health care needs will improve Outcome: Progressing Goal: Compliance with treatment plan for underlying cause of condition will improve Outcome: Progressing   Problem: Physical Regulation: Goal: Ability to maintain clinical measurements within normal limits will improve Outcome: Progressing   Problem: Safety: Goal: Periods of time without injury will increase Outcome: Progressing

## 2022-10-25 NOTE — Progress Notes (Signed)
Pt. States that she does not have any thoughts of SI or HI. She has been up interacting frequently. Medicated for pain x2. Attempted to remove sutures from left leg wound as ordered but, a scab has grown over the sutures and they appear to be embedded.I was able to cut the knot but, I could not pull the sutures out. Wound site to left leg with mild edema, and redness. The nurse in charge also witnessed this wound. Pt. Is on oral antibiotics.Will notify doctor.

## 2022-10-25 NOTE — Plan of Care (Signed)
Pt. Up and about frequently, interacting with team groups.

## 2022-10-25 NOTE — Progress Notes (Signed)
[facility] MD Progress Note  10/25/2022 3:06 PM Darlene Gates  MRN:  [id number]  Subjective:   Patient is a 37 y/o female with history of unspecified mood and psychotic disorder, alcohol use disorder, cocaine use, tardive dyskinesia admitted to [facility] inpatient psychiatry as a transfer from [facility] for SI with plan to overdose.   Labs, results, nursing and treatment team notes reviewed prior to evaluation.  The client reported moderate to low depression with no suicidal ideations.  Her anxiety is high, no panic attacks.  Sleep is "ok", appetite is "fair".  She reports some "sweats and shakes" from alcohol withdrawal.  Encouraged fluids.  She did state the medications are making her drowsy, most likely the Ativan detox.  Recommended rehab which she is not interested, she is interested in finding a shelter after discharge as she is currently homeless in [place] where she hoping to get a job at [employer] on Tuesday.  She does get her last pay check in a couple of days from her last job which will help.  Sutures to left lower leg, 7 days, removal this afternoon.  Principal Problem: Major depressive disorder, recurrent severe without psychotic features (HCC) Diagnosis: Principal Problem:   Major depressive disorder, recurrent severe without psychotic features (HCC) Active Problems:   Generalized anxiety disorder   Alcohol use disorder, severe, dependence (HCC)  Total Time spent with patient: 30 minutes  Past Psychiatric History: depression, cocaine and alcohol use d/o  Past Medical History:  Past Medical History:  Diagnosis Date   Alcohol use disorder, severe, dependence (HCC)    Anxiety    Cocaine use    Depression    Psychosis, unspecified psychosis type (HCC)    History reviewed. No pertinent surgical history. Family History:  Family History  Problem Relation Age of Onset   Alcohol abuse Mother    Bipolar disorder Mother    Depression Mother    Alcohol abuse Father    Hyperlipidemia  Father    Hypertension Father    Alcohol abuse Maternal Grandmother    Bipolar disorder Maternal Grandmother    Alcohol abuse Maternal Grandfather    Family Psychiatric  History: see above Social History:  Social History   Substance and Sexual Activity  Alcohol Use Yes   Alcohol/week: 4.0 standard drinks of alcohol   Types: 3 Cans of beer, 1 Shots of liquor per week   Comment: pt reports she drinks daily     Social History   Substance and Sexual Activity  Drug Use Not on file   Comment: pt reports she uses daily    Social History   Socioeconomic History   Marital status: Married    Spouse name: Not on file   Number of children: Not on file   Years of education: Not on file   Highest education level: Not on file  Occupational History   Not on file  Tobacco Use   Smoking status: Every Day    Current packs/day: 2.00    Types: Cigarettes   Smokeless tobacco: Never  Vaping Use   Vaping status: Never Used  Substance and Sexual Activity   Alcohol use: Yes    Alcohol/week: 4.0 standard drinks of alcohol    Types: 3 Cans of beer, 1 Shots of liquor per week    Comment: pt reports she drinks daily   Drug use: Not on file    Comment: pt reports she uses daily   Sexual activity: Yes    Partners: Male  Birth control/protection: Injection, Condom  Other Topics Concern   Not on file  Social History Narrative   Not on file   Social Determinants of Health   Financial Resource Strain: Not on file  Food Insecurity: Food Insecurity Present (10/23/2022)   Hunger Vital Sign    Worried About Running Out of Food in the Last Year: Often true    Ran Out of Food in the Last Year: Often true  Transportation Needs: Unmet Transportation Needs (10/23/2022)   PRAPARE - Administrator, Civil Service (Medical): Yes    Lack of Transportation (Non-Medical): Yes  Physical Activity: Not on file  Stress: Not on file  Social Connections: Not on file   Additional Social History:  homeless  Sleep: Fair  Appetite:  Fair  Current Medications: Current Facility-Administered Medications  Medication Dose Route Frequency Provider Last Rate Last Admin   alum & mag hydroxide-simeth (MAALOX/MYLANTA) 200-200-20 MG/5ML suspension 30 mL  30 mL Oral Q4H PRN Onuoha, Chinwendu V, NP       amoxicillin-clavulanate (AUGMENTIN) 875-125 MG per tablet 1 tablet  1 tablet Oral Q12H Lauree Chandler, NP   1 tablet at 10/25/22 1234   ARIPiprazole (ABILIFY) tablet 5 mg  5 mg Oral Daily Lauree Chandler, NP   5 mg at 10/25/22 0840   gabapentin (NEURONTIN) capsule 200 mg  200 mg Oral TID Lauree Chandler, NP   200 mg at 10/25/22 1234   hydrOXYzine (ATARAX) tablet 25 mg  25 mg Oral Q6H PRN Lauree Chandler, NP       ibuprofen (ADVIL) tablet 400 mg  400 mg Oral Q6H PRN Lauree Chandler, NP   400 mg at 10/25/22 1234   loperamide (IMODIUM) capsule 2-4 mg  2-4 mg Oral PRN Lauree Chandler, NP       LORazepam (ATIVAN) tablet 2 mg  2 mg Oral TID PRN Onuoha, Chinwendu V, NP   2 mg at 10/24/22 2117   Or   LORazepam (ATIVAN) injection 2 mg  2 mg Intramuscular TID PRN Onuoha, Chinwendu V, NP       LORazepam (ATIVAN) tablet 1 mg  1 mg Oral Q6H PRN Lauree Chandler, NP       magnesium hydroxide (MILK OF MAGNESIA) suspension 30 mL  30 mL Oral Daily PRN Onuoha, Chinwendu V, NP       multivitamin with minerals tablet 1 tablet  1 tablet Oral Daily Lauree Chandler, NP   1 tablet at 10/25/22 0840   nicotine (NICODERM CQ - dosed in mg/24 hours) patch 21 mg  21 mg Transdermal Daily Lauree Chandler, NP   21 mg at 10/25/22 0841   ondansetron (ZOFRAN-ODT) disintegrating tablet 4 mg  4 mg Oral Q6H PRN Lauree Chandler, NP   4 mg at 10/23/22 1405   thiamine (VITAMIN B1) tablet 100 mg  100 mg Oral Daily Lauree Chandler, NP   100 mg at 10/25/22 0841   traZODone (DESYREL) tablet 50 mg  50 mg Oral QHS PRN Onuoha, Chinwendu V, NP       valbenazine (INGREZZA) capsule 40 mg  40 mg Oral Daily  Lauree Chandler, NP   40 mg at 10/25/22 0841   venlafaxine XR (EFFEXOR-XR) 24 hr capsule 37.5 mg  37.5 mg Oral Q breakfast Onuoha, Chinwendu V, NP   37.5 mg at 10/25/22 0840    Lab Results: No results found for this or any previous visit (from the past 48 hour(s)).  Blood Alcohol level:  Lab Results  Component Value Date   ETH 30 (H) 10/22/2022   ETH 283 (H) 11/11/2021    Metabolic Disorder Labs: Lab Results  Component Value Date   HGBA1C 4.8 11/13/2021   MPG 91.06 11/13/2021   MPG 111 01/13/2020   No results found for: "PROLACTIN" Lab Results  Component Value Date   CHOL 155 11/13/2021   TRIG 76 11/13/2021   HDL 67 11/13/2021   CHOLHDL 2.3 11/13/2021   VLDL 15 11/13/2021   LDLCALC 73 11/13/2021   LDLCALC 74 01/13/2020    Physical Findings: AIMS:  , ,  ,  ,    CIWA:  CIWA-Ar Total: 0 COWS:     Musculoskeletal: Strength & Muscle Tone: within normal limits Gait & Station: normal Patient leans: N/A  Psychiatric Specialty Exam: Physical Exam Vitals and nursing note reviewed.  Constitutional:      Appearance: Normal appearance.  HENT:     Head: Normocephalic.     Nose: Nose normal.  Pulmonary:     Effort: Pulmonary effort is normal.  Musculoskeletal:        General: Normal range of motion.     Cervical back: Normal range of motion.  Neurological:     General: No focal deficit present.     Mental Status: She is alert and oriented to person, place, and time.  Psychiatric:        Attention and Perception: Attention and perception normal.        Mood and Affect: Mood is anxious and depressed. Affect is blunt.        Speech: Speech normal.        Behavior: Behavior normal. Behavior is cooperative.        Thought Content: Thought content normal.        Cognition and Memory: Cognition and memory normal.        Judgment: Judgment normal.     Review of Systems  Psychiatric/Behavioral:  Positive for depression and substance abuse. The patient is  nervous/anxious.   All other systems reviewed and are negative.   Blood pressure 112/81, pulse (!) 59, temperature 98.8 F (37.1 C), temperature source Oral, resp. rate 17, height 5\' 4"  (1.626 m), weight 81.6 kg, last menstrual period 10/17/2022, SpO2 99%, unknown if currently breastfeeding.Body mass index is 30.9 kg/m.  General Appearance: Disheveled  Eye Contact:  Fair  Speech:  Normal Rate  Volume:  Normal  Mood:  Anxious and Depressed  Affect:  Blunt  Thought Process:  Coherent  Orientation:  Full (Time, Place, and Person)  Thought Content:  Rumination  Suicidal Thoughts:  No  Homicidal Thoughts:  No  Memory:  Immediate;   Fair Recent;   Fair Remote;   Fair  Judgement:  Poor  Insight:  Fair  Psychomotor Activity:  Decreased  Concentration:  Concentration: Fair and Attention Span: Fair  Recall:  [employer] of Knowledge:  Fair  Language:  Good  Akathisia:  No  Handed:  Right  AIMS (if indicated):     Assets:  Leisure Time Physical Health Resilience  ADL's:  Intact  Cognition:  WNL  Sleep:          Physical Exam: Physical Exam Vitals and nursing note reviewed.  Constitutional:      Appearance: Normal appearance.  HENT:     Head: Normocephalic.     Nose: Nose normal.  Pulmonary:     Effort: Pulmonary effort is normal.  Musculoskeletal:  General: Normal range of motion.     Cervical back: Normal range of motion.  Neurological:     General: No focal deficit present.     Mental Status: She is alert and oriented to person, place, and time.  Psychiatric:        Attention and Perception: Attention and perception normal.        Mood and Affect: Mood is anxious and depressed. Affect is blunt.        Speech: Speech normal.        Behavior: Behavior normal. Behavior is cooperative.        Thought Content: Thought content normal.        Cognition and Memory: Cognition and memory normal.        Judgment: Judgment normal.    Review of Systems   Psychiatric/Behavioral:  Positive for depression and substance abuse. The patient is nervous/anxious.   All other systems reviewed and are negative.  Blood pressure 112/81, pulse (!) 59, temperature 98.8 F (37.1 C), temperature source Oral, resp. rate 17, height 5\' 4"  (1.626 m), weight 81.6 kg, last menstrual period 10/17/2022, SpO2 99%, unknown if currently breastfeeding. Body mass index is 30.9 kg/m.   Treatment Plan Summary: Daily contact with patient to assess and evaluate symptoms and progress in treatment, Medication management, and Plan : Major depressive disorder, recurrent, severe without psychosis: Abilify 5 mg daily Effexor 37.5 mg daily  Anxiety: Hydroxyzine 25 mg every 6 hours PRN Gabapentin 200 mg TID  Insomnia: Trazodone 50 mg daily at bedtime PRN  Alcohol use d/o: Ativan detox protocol in place  Nanine Means, NP 10/25/2022, 3:06 PM

## 2022-10-26 DIAGNOSIS — F332 Major depressive disorder, recurrent severe without psychotic features: Secondary | ICD-10-CM | POA: Diagnosis not present

## 2022-10-26 NOTE — Group Note (Signed)
LCSW Group Therapy Note  Group Date: 10/26/2022 Start Time: 1345 End Time: 1420   Type of Therapy and Topic:  Group Therapy - Healthy vs Unhealthy Coping Skills  Participation Level:  Did Not Attend   Description of Group The focus of this group was to determine what unhealthy coping techniques typically are used by group members and what healthy coping techniques would be helpful in coping with various problems. Patients were guided in becoming aware of the differences between healthy and unhealthy coping techniques. Patients were asked to identify 2-3 healthy coping skills they would like to learn to use more effectively.  Therapeutic Goals Patients learned that coping is what human beings do all day long to deal with various situations in their lives Patients defined and discussed healthy vs unhealthy coping techniques Patients identified their preferred coping techniques and identified whether these were healthy or unhealthy Patients determined 2-3 healthy coping skills they would like to become more familiar with and use more often. Patients provided support and ideas to each other   Summary of Patient Progress:  The patient did not attend group.   Therapeutic Modalities Cognitive Behavioral Therapy Motivational Interviewing  Tama Headings 10/26/2022  2:31 PM

## 2022-10-26 NOTE — Progress Notes (Signed)
Patient alert and oriented x 4, affect is flat but brightens upon approach, she denies SI/HI/AVH interacting appropriately with peers and staff, her thoughts are organized and coherent, will continue to monitor.

## 2022-10-26 NOTE — Group Note (Signed)
Date:  10/26/2022 Time:  8:26 PM  Group Topic/Focus:  Building Self Esteem:   The Focus of this group is helping patients become aware of the effects of self-esteem on their lives, the things they and others do that enhance or undermine their self-esteem, seeing the relationship between their level of self-esteem and the choices they make and learning ways to enhance self-esteem.    Participation Level:  Did Not Attend     Lenore Cordia 10/26/2022, 8:26 PM

## 2022-10-26 NOTE — Group Note (Signed)
Date:  10/26/2022 Time:  11:37 AM  Group Topic/Focus:  OUTDOOR RECREATION AND SOCIALIZATION.    Participation Level:  Active  Participation Quality:  Appropriate, Sharing, and Supportive  Affect:  Appropriate and Excited  Cognitive:  Alert, Appropriate, and Oriented  Insight: Appropriate  Engagement in Group:  Developing/Improving and Supportive  Modes of Intervention:  Activity, Discussion, Rapport Building, Socialization, and Support  Additional Comments:    Rosaura Carpenter 10/26/2022, 11:37 AM

## 2022-10-26 NOTE — Group Note (Signed)
Date:  10/26/2022 Time:  6:28 PM  Group Topic/Focus:  Healthy Communication:   The focus of this group is to discuss communication, barriers to communication, as well as healthy ways to communicate with others.    Participation Level:  Active  Participation Quality:  Appropriate  Affect:  Appropriate  Cognitive:  Appropriate  Insight: Appropriate  Engagement in Group:  Engaged  Modes of Intervention:  Activity  Additional Comments:    Wilford Corner 10/26/2022, 6:28 PM

## 2022-10-26 NOTE — Plan of Care (Signed)
  Problem: Clinical Measurements: Goal: Ability to maintain clinical measurements within normal limits will improve Outcome: Progressing   Problem: Pain Managment: Goal: General experience of comfort will improve Outcome: Progressing   

## 2022-10-26 NOTE — Progress Notes (Signed)
[facility] MD Progress Note  10/26/2022 11:10 AM Darlene Gates  MRN:  [id number]  Subjective:   On assessment , patient is sitting in bed in no acute distress. Patient is alert, oriented x 4, and cooperative. Speech is clear, slow, and coherent. Pt appears well groomed. Eye contact is fair. She endorses anxiety (6/10) and depression (8/10), affect is congruent with mood. She states she believes going outside will improve her mood as she feels "closed in and I have a lot on my mind". She states she is worried about finding housing and going to her job interview on Tuesday. Thought process is coherent and goal directed. Thought content is WDL. Pt  denies suicidal or homicidal ideation as well as auditory or visual hallucinations. Patient states she did not sleep well due to nightmares. Appetite is "good" and she denies experiencing side effects from her medications. Patient was taken outside by the [facility] where she interacted with peers. Patient voiced concerns of her stiches being unable to come out. I advised that they will grow out in time. Patient voiced understanding. Patient requested resources for housing which were provided. Patient offered support and encouragement. Patient denied any additional needs at this time.   Diagnosis: Principal Problem:   Major depressive disorder, recurrent severe without psychotic features (HCC) Active Problems:   Generalized anxiety disorder   Alcohol use disorder, severe, dependence (HCC)  Total Time spent with patient: 30 minutes  Past Psychiatric History: depression, cocaine and alcohol use d/o  Past Medical History:  Past Medical History:  Diagnosis Date   Alcohol use disorder, severe, dependence (HCC)    Anxiety    Cocaine use    Depression    Psychosis, unspecified psychosis type (HCC)    History reviewed. No pertinent surgical history. Family History:  Family History  Problem Relation Age of Onset   Alcohol abuse Mother    Bipolar disorder Mother     Depression Mother    Alcohol abuse Father    Hyperlipidemia Father    Hypertension Father    Alcohol abuse Maternal Grandmother    Bipolar disorder Maternal Grandmother    Alcohol abuse Maternal Grandfather    Family Psychiatric  History: see above Social History:  Social History   Substance and Sexual Activity  Alcohol Use Yes   Alcohol/week: 4.0 standard drinks of alcohol   Types: 3 Cans of beer, 1 Shots of liquor per week   Comment: pt reports she drinks daily     Social History   Substance and Sexual Activity  Drug Use Not on file   Comment: pt reports she uses daily    Social History   Socioeconomic History   Marital status: Married    Spouse name: Not on file   Number of children: Not on file   Years of education: Not on file   Highest education level: Not on file  Occupational History   Not on file  Tobacco Use   Smoking status: Every Day    Current packs/day: 2.00    Types: Cigarettes   Smokeless tobacco: Never  Vaping Use   Vaping status: Never Used  Substance and Sexual Activity   Alcohol use: Yes    Alcohol/week: 4.0 standard drinks of alcohol    Types: 3 Cans of beer, 1 Shots of liquor per week    Comment: pt reports she drinks daily   Drug use: Not on file    Comment: pt reports she uses daily   Sexual activity: Yes  Partners: Male    Birth control/protection: Injection, Condom  Other Topics Concern   Not on file  Social History Narrative   Not on file   Social Determinants of Health   Financial Resource Strain: Not on file  Food Insecurity: Food Insecurity Present (10/23/2022)   Hunger Vital Sign    Worried About Running Out of Food in the Last Year: Often true    Ran Out of Food in the Last Year: Often true  Transportation Needs: Unmet Transportation Needs (10/23/2022)   PRAPARE - Administrator, Civil Service (Medical): Yes    Lack of Transportation (Non-Medical): Yes  Physical Activity: Not on file  Stress: Not on  file  Social Connections: Not on file   Additional Social History: homeless  Sleep: Fair  Appetite:  Fair  Current Medications: Current Facility-Administered Medications  Medication Dose Route Frequency Provider Last Rate Last Admin   alum & mag hydroxide-simeth (MAALOX/MYLANTA) 200-200-20 MG/5ML suspension 30 mL  30 mL Oral Q4H PRN Onuoha, Chinwendu V, NP       amoxicillin-clavulanate (AUGMENTIN) 875-125 MG per tablet 1 tablet  1 tablet Oral Q12H Lauree Chandler, NP   1 tablet at 10/26/22 0100   ARIPiprazole (ABILIFY) tablet 5 mg  5 mg Oral Daily Lauree Chandler, NP   5 mg at 10/26/22 1914   gabapentin (NEURONTIN) capsule 200 mg  200 mg Oral TID Lauree Chandler, NP   200 mg at 10/26/22 7829   ibuprofen (ADVIL) tablet 400 mg  400 mg Oral Q6H PRN Lauree Chandler, NP   400 mg at 10/26/22 5621   LORazepam (ATIVAN) tablet 2 mg  2 mg Oral TID PRN Onuoha, Chinwendu V, NP   2 mg at 10/26/22 3086   Or   LORazepam (ATIVAN) injection 2 mg  2 mg Intramuscular TID PRN Onuoha, Chinwendu V, NP       magnesium hydroxide (MILK OF MAGNESIA) suspension 30 mL  30 mL Oral Daily PRN Onuoha, Chinwendu V, NP       multivitamin with minerals tablet 1 tablet  1 tablet Oral Daily Lauree Chandler, NP   1 tablet at 10/26/22 5784   nicotine (NICODERM CQ - dosed in mg/24 hours) patch 21 mg  21 mg Transdermal Daily Lauree Chandler, NP   21 mg at 10/26/22 0800   thiamine (VITAMIN B1) tablet 100 mg  100 mg Oral Daily Lauree Chandler, NP   100 mg at 10/26/22 6962   traZODone (DESYREL) tablet 50 mg  50 mg Oral QHS PRN Onuoha, Chinwendu V, NP       valbenazine (INGREZZA) capsule 40 mg  40 mg Oral Daily Lauree Chandler, NP   40 mg at 10/26/22 9528   venlafaxine XR (EFFEXOR-XR) 24 hr capsule 37.5 mg  37.5 mg Oral Q breakfast Onuoha, Chinwendu V, NP   37.5 mg at 10/26/22 4132    Lab Results: No results found for this or any previous visit (from the past 48 hour(s)).  Blood Alcohol level:   Lab Results  Component Value Date   ETH 30 (H) 10/22/2022   ETH 283 (H) 11/11/2021    Metabolic Disorder Labs: Lab Results  Component Value Date   HGBA1C 4.8 11/13/2021   MPG 91.06 11/13/2021   MPG 111 01/13/2020   No results found for: "PROLACTIN" Lab Results  Component Value Date   CHOL 155 11/13/2021   TRIG 76 11/13/2021   HDL 67 11/13/2021   CHOLHDL 2.3  11/13/2021   VLDL 15 11/13/2021   LDLCALC 73 11/13/2021   LDLCALC 74 01/13/2020    Physical Findings: AIMS:  , ,  ,  ,    CIWA:  CIWA-Ar Total: 0 COWS:     Musculoskeletal: Strength & Muscle Tone: within normal limits Gait & Station: normal Patient leans: N/A  Psychiatric Specialty Exam: Physical Exam Vitals and nursing note reviewed.  Constitutional:      Appearance: Normal appearance.  HENT:     Head: Normocephalic.     Nose: Nose normal.  Pulmonary:     Effort: Pulmonary effort is normal.  Musculoskeletal:        General: Normal range of motion.     Cervical back: Normal range of motion.  Neurological:     General: No focal deficit present.     Mental Status: She is alert and oriented to person, place, and time.  Psychiatric:        Attention and Perception: Attention and perception normal.        Mood and Affect: Mood is anxious and depressed. Affect is blunt.        Speech: Speech normal.        Behavior: Behavior normal. Behavior is cooperative.        Thought Content: Thought content normal.        Cognition and Memory: Cognition and memory normal.        Judgment: Judgment normal.     Review of Systems  Psychiatric/Behavioral:  Positive for depression and substance abuse. The patient is nervous/anxious.   All other systems reviewed and are negative.   Blood pressure 119/79, pulse 68, temperature 98.3 F (36.8 C), temperature source Oral, resp. rate 17, height 5\' 4"  (1.626 m), weight 81.6 kg, last menstrual period 10/17/2022, SpO2 98%, unknown if currently breastfeeding.Body mass index is  30.9 kg/m.  General Appearance: Disheveled  Eye Contact:  Fair  Speech:  Normal Rate  Volume:  Normal  Mood:  Anxious and Depressed  Affect:  Blunt  Thought Process:  Coherent  Orientation:  Full (Time, Place, and Person)  Thought Content:  Rumination  Suicidal Thoughts:  No  Homicidal Thoughts:  No  Memory:  Immediate;   Fair Recent;   Fair Remote;   Fair  Judgement:  Poor  Insight:  Fair  Psychomotor Activity:  Decreased  Concentration:  Concentration: Fair and Attention Span: Fair  Recall:  [employer] of Knowledge:  Fair  Language:  Good  Akathisia:  No  Handed:  Right  AIMS (if indicated):     Assets:  Leisure Time Physical Health Resilience  ADL's:  Intact  Cognition:  WNL  Sleep:          Physical Exam: Physical Exam Vitals and nursing note reviewed.  Constitutional:      Appearance: Normal appearance.  HENT:     Head: Normocephalic.     Nose: Nose normal.  Pulmonary:     Effort: Pulmonary effort is normal.  Musculoskeletal:        General: Normal range of motion.     Cervical back: Normal range of motion.  Neurological:     General: No focal deficit present.     Mental Status: She is alert and oriented to person, place, and time.  Psychiatric:        Attention and Perception: Attention and perception normal.        Mood and Affect: Mood is anxious and depressed. Affect is blunt.  Speech: Speech normal.        Behavior: Behavior normal. Behavior is cooperative.        Thought Content: Thought content normal.        Cognition and Memory: Cognition and memory normal.        Judgment: Judgment normal.    Review of Systems  Psychiatric/Behavioral:  Positive for depression and substance abuse. The patient is nervous/anxious.   All other systems reviewed and are negative.  Blood pressure 119/79, pulse 68, temperature 98.3 F (36.8 C), temperature source Oral, resp. rate 17, height 5\' 4"  (1.626 m), weight 81.6 kg, last menstrual period  10/17/2022, SpO2 98%, unknown if currently breastfeeding. Body mass index is 30.9 kg/m.   Treatment Plan Summary: Daily contact with patient to assess and evaluate symptoms and progress in treatment, Medication management, and Plan : Major depressive disorder, recurrent, severe without psychosis: Abilify 5 mg daily Effexor 37.5 mg daily  Anxiety: Hydroxyzine 25 mg every 6 hours PRN Gabapentin 200 mg TID  Insomnia: Trazodone 50 mg daily at bedtime PRN  Alcohol use d/o: Ativan detox protocol in place  Nanine Means, NP 10/26/2022, 11:10 AM

## 2022-10-26 NOTE — Progress Notes (Signed)
Pt fixated on suture in LLE and wanting it removed. No s/s of infection. Catha Nottingham NP aware.

## 2022-10-26 NOTE — Plan of Care (Signed)
  Problem: Activity: Goal: Interest or engagement in activities will improve Outcome: Progressing Goal: Sleeping patterns will improve Outcome: Progressing   Problem: Coping: Goal: Ability to verbalize frustrations and anger appropriately will improve Outcome: Progressing Goal: Ability to demonstrate self-control will improve Outcome: Progressing   Problem: Health Behavior/Discharge Planning: Goal: Identification of resources available to assist in meeting health care needs will improve Outcome: Progressing Goal: Compliance with treatment plan for underlying cause of condition will improve Outcome: Progressing   

## 2022-10-26 NOTE — Progress Notes (Signed)
D: Patient alert and oriented. Affect/mood reported as improving. Denies SI, HI, AVH, and pain. Patient appears preoccupied at times. Denies any withdrawal symptoms today. Pt verbalized being ready to go home. Verbalized concerns over sutures in left lower extremity, no redness or warmness noted. Patient goal, "a plan and going home Saturday or Monday morning. Appts and all sat up plan set together alongs with shelters, primary care."  A: Scheduled medication administered to patient, per MD orders. Support and encouragement provided. Routine safety checks conducted every 15 minutes. Patient informed to notify staff with problems or concerns.   R: No adverse drug reactions noted. Patient contracts for safety at this time. Patient compliant with medications and treatment plan. Patient receptive, calm and cooperative. Patient interacts well with others on unit. Patient remains safe at this time.

## 2022-10-27 MED ORDER — HYDROXYZINE HCL 50 MG PO TABS
50.0000 mg | ORAL_TABLET | Freq: Three times a day (TID) | ORAL | Status: DC | PRN
  Administered 2022-10-27: 50 mg via ORAL
  Filled 2022-10-27: qty 1

## 2022-10-27 NOTE — Plan of Care (Signed)
  Problem: Clinical Measurements: Goal: Will remain free from infection Outcome: Progressing   Problem: Clinical Measurements: Goal: Diagnostic test results will improve Outcome: Progressing   Problem: Nutrition: Goal: Adequate nutrition will be maintained Outcome: Progressing   Problem: Health Behavior/Discharge Planning: Goal: Compliance with treatment plan for underlying cause of condition will improve Outcome: Progressing

## 2022-10-27 NOTE — Plan of Care (Signed)
Patient verbalized anxiety about homelessness, missing her kids and her sutures on her leg. Vistaril helped her calm down. Patient stated that she likes to get discharge tomorrow  for a job interview on Tuesday.  Patient appropriate with staff & peers. Denies SI,HI and AVH. Appetite and energy level good. ADLs maintained. Support and encouragement given.

## 2022-10-27 NOTE — Group Note (Signed)
Date:  10/27/2022 Time:  3:42 PM  Group Topic/Focus:  Activity/Outdoor Recreation    Participation Level:  Active  Participation Quality:  Appropriate  Affect:  Appropriate  Cognitive:  Appropriate  Insight: Appropriate  Engagement in Group:  Engaged  Modes of Intervention:  Activity  Additional Comments:    Wilford Corner 10/27/2022, 3:42 PM

## 2022-10-27 NOTE — Progress Notes (Signed)
Patient alert and oriented no distress noted, interacting appropriately with peers and staff, her thoughts are organized and coherent, on CIWA  protocol no withdraw symptom noted. Patient endorsed pain was medicated as ordered , she was seen briefly in milieu interacting with peers. Patient is complaint with medication regimen, 15 minutes safety checks notes will continue to monitor.
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
## 2022-10-28 MED ORDER — VENLAFAXINE HCL ER 37.5 MG PO CP24: 37.5000 mg | ORAL_CAPSULE | Freq: Every day | ORAL | 0 refills | Status: DC

## 2022-10-28 MED ORDER — ARIPIPRAZOLE 5 MG PO TABS: 5.0000 mg | ORAL_TABLET | Freq: Every day | ORAL | 0 refills | Status: DC

## 2022-10-28 MED ORDER — GABAPENTIN 100 MG PO CAPS: 200.0000 mg | ORAL_CAPSULE | Freq: Three times a day (TID) | ORAL | 0 refills | Status: DC

## 2022-10-28 MED ORDER — HYDROXYZINE HCL 50 MG PO TABS: 50.0000 mg | ORAL_TABLET | Freq: Three times a day (TID) | ORAL | 0 refills | Status: AC | PRN

## 2022-10-28 MED ORDER — VALBENAZINE TOSYLATE 40 MG PO CAPS: 40.0000 mg | ORAL_CAPSULE | Freq: Every day | ORAL | 0 refills | Status: AC
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
## 2022-10-28 NOTE — Progress Notes (Signed)
Patient alert and oriented x 4, affect is flat but brightens on approach, interacting appropriately with peers and staff, her thoughts are organized and coherent, on CIWA she reported feeling anxious and restless, she was medicated for CIWA.  Patient was seen briefly in milieu interacting with peers. Patient is complaint with medication regimen, 15 minutes safety checks notes will continue to monitor.

## 2022-10-28 NOTE — Group Note (Signed)
Recreation Therapy Group Note   Group Topic:Coping Skills  Group Date: 10/28/2022 Start Time: 1000 End Time: 1110 Facilitators: Rosina Lowenstein, LRT, CTRS Location:  Craft Room  Group Description: Mind Map.  Patient was provided a blank template of a diagram with 32 blank boxes in a tiered system, branching from the center (similar to a bubble chart). LRT directed patients to label the middle of the diagram "Coping Skills". LRT and patients then came up with 8 different coping skills as examples. Pt were directed to record their coping skills in the 2nd tier boxes closest to the center.  Patients would then share their coping skills with the group as LRT wrote them out. LRT gave a handout of 99 different coping skills at the end of group. Afterwards, pts requested to sing karaoke.    Goal Area(s) Addressed: Patients will be able to define "coping skills". Patient will identify new coping skills.  Patient will increase communication.   Affect/Mood: Appropriate   Participation Level: Active and Engaged   Participation Quality: Independent   Behavior: Appropriate, Calm, and Cooperative   Speech/Thought Process: Coherent   Insight: Good   Judgement: Good   Modes of Intervention: Guided Discussion and Worksheet   Patient Response to Interventions:  Attentive, Engaged, and Interested    Education Outcome:  Acknowledges education   Clinical Observations/Individualized Feedback: Nohelani was active in their participation of session activities and group discussion. Pt identified "reading, fishing, arts and crafts, and crocheting" as coping skills. Pt spontaneously contributed to group discussion and chose to sing karaoke. Pt interacted well with LRT and peers duration of session.   Plan: Continue to engage patient in RT group sessions 2-3x/week.   Rosina Lowenstein, LRT, CTRS 10/28/2022 11:24 AM

## 2022-10-28 NOTE — Plan of Care (Signed)
  Problem: Clinical Measurements: Goal: Will remain free from infection Outcome: Progressing   Problem: Coping: Goal: Level of anxiety will decrease Outcome: Progressing   
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
## 2022-10-28 NOTE — Progress Notes (Signed)
Patient is discharging at this time. Patient is A&Ox4. Stable. Patient denies SI,HI, and A/V/H with no plan/intent. Printed AVS reviewed with and given to patient along with medications and follow up appointments. Suicide safety plan complete with copy provided to patient. Original form in chart. Patient verbalized all understanding. All valuables/belongings returned to patient. Patient is being transported by taxi. Patient denies any pain/discomfort. No s/s of current distress.

## 2022-10-28 NOTE — Telephone Encounter (Signed)
Yes, I will make sure I see her when she comes in.

## 2022-10-28 NOTE — Progress Notes (Signed)
  [facility] Adult Case Management Discharge Plan :  Will you be returning to the same living situation after discharge:  Yes,  pt reports that she is returning to the motel At discharge, do you have transportation home?: Yes,  CSW to assist with transportation needs. Do you have the ability to pay for your medications: Yes,  [employer] / [employer]  Release of information consent forms completed and in the chart;  Patient's signature needed at discharge.  Patient to Follow up at:  Follow-up Information     [facility] Follow up.   Why: Appointment is scheduled for 11/04/2022 at 3 via the phone.  They will call [phone]. Contact information: [address]  [address] [place] [address] [phone]                 Next level of care provider has access to [facility] Link:no  Safety Planning and Suicide Prevention discussed: Yes,  SPE completed with the patient. Patient declined collateral contact.      Has patient been referred to the Quitline?: Patient refused referral for treatment  Patient has been referred for addiction treatment: Patient refused referral for treatment.  Harden Mo, LCSW 10/28/2022, 12:11 PM

## 2022-10-29 ENCOUNTER — Emergency Department (HOSPITAL_COMMUNITY)
Admission: EM | Admit: 2022-10-29 | Discharge: 2022-10-29 | Disposition: A | Discharge status: Home / Self Care | Payer: MEDICAID | Attending: Emergency Medicine | Admitting: Emergency Medicine

## 2022-10-29 ENCOUNTER — Encounter (HOSPITAL_COMMUNITY): Payer: Self-pay

## 2022-10-29 ENCOUNTER — Other Ambulatory Visit: Payer: Self-pay

## 2022-10-29 DIAGNOSIS — Z91010 Allergy to peanuts: Secondary | ICD-10-CM | POA: Insufficient documentation

## 2022-10-29 DIAGNOSIS — J45909 Unspecified asthma, uncomplicated: Secondary | ICD-10-CM | POA: Insufficient documentation

## 2022-10-29 DIAGNOSIS — Z4802 Encounter for removal of sutures: Secondary | ICD-10-CM | POA: Diagnosis not present

## 2022-10-29 DIAGNOSIS — T63441A Toxic effect of venom of bees, accidental (unintentional), initial encounter: Secondary | ICD-10-CM | POA: Insufficient documentation

## 2022-10-29 DIAGNOSIS — Z760 Encounter for issue of repeat prescription: Secondary | ICD-10-CM | POA: Insufficient documentation

## 2022-10-29 DIAGNOSIS — T63461A Toxic effect of venom of wasps, accidental (unintentional), initial encounter: Secondary | ICD-10-CM

## 2022-10-29 MED ORDER — DIPHENHYDRAMINE HCL 50 MG/ML IJ SOLN
25.0000 mg | Freq: Once | INTRAMUSCULAR | Status: DC
  Filled 2022-10-29: qty 1

## 2022-10-29 MED ORDER — DIPHENHYDRAMINE HCL 50 MG/ML IJ SOLN
25.0000 mg | Freq: Once | INTRAMUSCULAR | Status: AC
  Administered 2022-10-29: 25 mg via INTRAMUSCULAR

## 2022-10-29 MED ORDER — ALBUTEROL SULFATE HFA 108 (90 BASE) MCG/ACT IN AERS
2.0000 | INHALATION_SPRAY | Freq: Once | RESPIRATORY_TRACT | Status: AC
  Administered 2022-10-29: 2 via RESPIRATORY_TRACT
  Filled 2022-10-29: qty 6.7

## 2022-10-29 MED ORDER — HYDROCORTISONE 1 % EX CREA
TOPICAL_CREAM | Freq: Once | CUTANEOUS | Status: AC
  Filled 2022-10-29: qty 28

## 2022-10-29 NOTE — Discharge Instructions (Addendum)
It was a pleasure taking care of you today. You will need benadryl over the next couple of days as the yellow jacket stings heal. You also need to follow up with your provider given your Abilify during your appointment this upcoming Monday. Seek emergency care if experiencing any new or worsening symptoms.  Alternating between 650 mg Tylenol and 400 mg Advil: The best way to alternate taking Acetaminophen (example Tylenol) and Ibuprofen (example Advil/Motrin) is to take them 3 hours apart. For example, if you take ibuprofen at 6 am you can then take Tylenol at 9 am. You can continue this regimen throughout the day, making sure you do not exceed the recommended maximum dose for each drug.

## 2022-10-29 NOTE — ED Provider Notes (Signed)
[place] EMERGENCY DEPARTMENT AT [facility] Provider Note   CSN: [id number] Arrival date & time: 10/29/22  1147     History  Chief Complaint  Patient presents with   Insect Bite   Medication Reaction    Darlene Gates is a 37 y.o. female who presents to ED concerned for yellow jacket stings. Stung multiple times by yellow jackets yesterday. Patient also requesting stitches removal. Patient has sutures placed 7/19. Patient also requesting albuterol refill - states that she has a history of asthma and lost her inhaler.   Denies chest pain, chest tightness, SOB, swelling of tongue/throat, nausea, vomiting   HPI     Home Medications Prior to Admission medications   Medication Sig Start Date End Date Taking? Authorizing Provider  ARIPiprazole (ABILIFY) 5 MG tablet Take 1 tablet (5 mg total) by mouth daily for 14 days. 10/29/22 11/12/22  Lauree Chandler, NP  gabapentin (NEURONTIN) 100 MG capsule Take 2 capsules (200 mg total) by mouth 3 (three) times daily for 14 days. 10/28/22 11/11/22  Lauree Chandler, NP  hydrOXYzine (ATARAX) 50 MG tablet Take 1 tablet (50 mg total) by mouth 3 (three) times daily as needed for up to 14 days for anxiety. 10/28/22 11/11/22  Lauree Chandler, NP  valbenazine [facility]) 40 MG capsule Take 1 capsule (40 mg total) by mouth daily for 14 days. 10/29/22 11/12/22  Lauree Chandler, NP  venlafaxine XR (EFFEXOR-XR) 37.5 MG 24 hr capsule Take 1 capsule (37.5 mg total) by mouth daily with breakfast for 14 days. 10/29/22 11/12/22  Lauree Chandler, NP      Allergies    Peanut (diagnostic), Peanut oil, Percocet [oxycodone-acetaminophen], Phenergan [promethazine], Pineapple, Thorazine [chlorpromazine], and Bee venom    Review of Systems   Review of Systems  Allergic/Immunologic:       Insect sting    Physical Exam Updated Vital Signs BP (!) 136/93   Pulse 96   Temp 98.1 F (36.7 C) (Oral)   Resp 16   LMP 10/17/2022 (Exact Date)    SpO2 100%  Physical Exam Vitals and nursing note reviewed.  Constitutional:      General: She is not in acute distress.    Appearance: She is not ill-appearing or toxic-appearing.  HENT:     Head: Normocephalic and atraumatic.     Mouth/Throat:     Mouth: Mucous membranes are moist.  Eyes:     General: No scleral icterus.       Right eye: No discharge.        Left eye: No discharge.     Conjunctiva/sclera: Conjunctivae normal.  Cardiovascular:     Rate and Rhythm: Normal rate and regular rhythm.     Pulses: Normal pulses.     Heart sounds: Normal heart sounds. No murmur heard. Pulmonary:     Effort: Pulmonary effort is normal. No respiratory distress.     Breath sounds: Normal breath sounds. No wheezing, rhonchi or rales.  Abdominal:     General: Abdomen is flat. Bowel sounds are normal.     Palpations: Abdomen is soft.     Tenderness: There is no abdominal tenderness.  Musculoskeletal:     Right lower leg: No edema.     Left lower leg: No edema.     Comments: +2 radial and pedal pulses.   Skin:    General: Skin is warm and dry.     Capillary Refill: Capillary refill takes less than 2 seconds.  Findings: No rash.     Comments: No swelling or rashes/erythema appreciated.   Neurological:     General: No focal deficit present.     Mental Status: She is alert. Mental status is at baseline.  Psychiatric:        Mood and Affect: Mood normal.     ED Results / Procedures / Treatments   Labs (all labs ordered are listed, but only abnormal results are displayed) Labs Reviewed - No data to display  EKG None  Radiology No results found.  Procedures .Suture Removal  Date/Time: 10/29/2022 12:44 PM  Performed by: Dorthy Cooler, PA-C Authorized by: Dorthy Cooler, PA-C   Consent:    Consent obtained:  Verbal   Consent given by:  Patient   Risks, benefits, and alternatives were discussed: yes     Risks discussed:  Bleeding, pain and wound separation    Alternatives discussed:  No treatment Universal protocol:    Patient identity confirmed:  Verbally with patient Location:    Location:  Lower extremity   Lower extremity location:  Leg   Leg location:  L lower leg Procedure details:    Wound appearance:  No signs of infection   Number of sutures removed:  1 Post-procedure details:    Procedure completion:  Tolerated Comments:     Patient tolerated well     Medications Ordered in ED Medications  albuterol (VENTOLIN HFA) 108 (90 Base) MCG/ACT inhaler 2 puff (2 puffs Inhalation Given 10/29/22 1249)  hydrocortisone cream 1 % ( Topical Given 10/29/22 1248)  diphenhydrAMINE (BENADRYL) injection 25 mg (25 mg Intramuscular Given 10/29/22 1249)    ED Course/ Medical Decision Making/ A&P                                 Medical Decision Making Risk Prescription drug management.   This patient presents to the ED for concern of bee stings and suture removal, this involves an extensive number of treatment options, and is a complaint that carries with it a high risk of complications and morbidity.  The differential diagnosis includes irritant contact dermatitis, DRESS, atopic dermatitis, anaphylaxis, SJS/TEN   Co morbidities that complicate the patient evaluation  none    Problem List / ED Course / Critical interventions / Medication management  Presents to emergency room concerned for multiple yellow jacket stings yesterday.  Denying any concerning symptoms for anaphylaxis such as tongue/throat swelling, SOB, chest pain.  Physical exam unremarkable.  Patient stating that she is having some itching where the stings were.  Provided patient with a dose of Benadryl which she tolerated well in the emergency room.  Also provided patient with hydrocortisone cream to help with itching from bug bites.  Provided patient with return precautions for if symptoms worsen or become concerning for anaphylaxis.  Educated patient to take Benadryl over the  next few days as symptoms resolve.  Patient verbally endorsed understanding of plan. Patient without any infectious complaints at this time to include fever, dyspnea, nausea, vomiting-I do not believe that labs are appropriate at this time.  Patient agreed to withhold labs at this time. Of note patient also requesting a new albuterol inhaler stating that she lost her old one.  Denying any respiratory symptoms at this time. Of note patient also requesting that the single suture in her lower left leg be removed today.  Suture has been in since 7/19.  Suture was removed  without bleeding.  Area appears to be healing well without any purulence, swelling, erythema, warmth. Of note patient also stating that she is having some adverse reactions to her mood disorder medications such as facial twitching.  Patient stating that she is following up with her provider on Monday.  Educated patient how to be in her best interest to follow-up with that provider who is managing her her mood disorder medications.  Patient verbally endorsed understanding of plan. I have reviewed the patients home medicines and have made adjustments as needed Patient afebrile with stable vitals.  Provided with return precautions.  Discharged in good condition.  Ddx: these are considered less likely due to history of present illness and physical exam - irritant contact dermatitis: patient without irritant exposure -DRESS: patient afebrile, stable vitals  -atopic dermatitis: rash diffuse, not confined to flexural areas -anaphylaxis: O2 100%, no troubles breathing -SJS/TEN: negative Nikolsky sign -[place] Spotted Fever/Lyme Disease: no hx tick bite or fever   Social Determinants of Health:  none          Final Clinical Impression(s) / ED Diagnoses Final diagnoses:  Sting from hornet, wasp, or bee, accidental or unintentional, initial encounter  Medication refill    Rx / DC Orders ED Discharge Orders     None          Dorthy Cooler, [place] 10/29/22 2232    Margarita Grizzle, MD 10/30/22 1306

## 2022-10-29 NOTE — ED Notes (Signed)
PA stated they were given benadryl and need to be monitored. Pt sent to lobby per PA and will be monitored and then discharged.
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
## 2022-10-31 ENCOUNTER — Ambulatory Visit: Payer: MEDICAID | Admitting: Student

## 2022-11-07 ENCOUNTER — Ambulatory Visit: Payer: MEDICAID | Admitting: Family Medicine

## 2022-11-07 NOTE — Progress Notes (Deleted)
    SUBJECTIVE:   CHIEF COMPLAINT / HPI:   STI STD testing  PERTINENT  PMH / PSH: ***  OBJECTIVE:   LMP 10/17/2022 (Exact Date)   ***  ASSESSMENT/PLAN:   No problem-specific Assessment & Plan notes found for this encounter.     Ivery Quale, MD [facility]

## 2022-11-08 IMAGING — CT CT ABD-PELV W/ CM
2 of 4 series · 17 of 46 positions shown, 19 images · IV contrast (agent unspecified)
Comparison: None.

CLINICAL DATA: Abdominal pain, nausea/vomiting, ETOH, recent
miscarriage on [REDACTED]

EXAM:
CT ABDOMEN AND PELVIS WITH CONTRAST
TECHNIQUE: Multidetector CT imaging of the abdomen and pelvis was performed
using the standard protocol following bolus administration of
intravenous contrast.

[Series 2: axial st · axial · 0.74mm/px · z∈[-437,-82]mm · 14 of 81 slices shown, 16 images]
[im 5/81  soft-tissue]
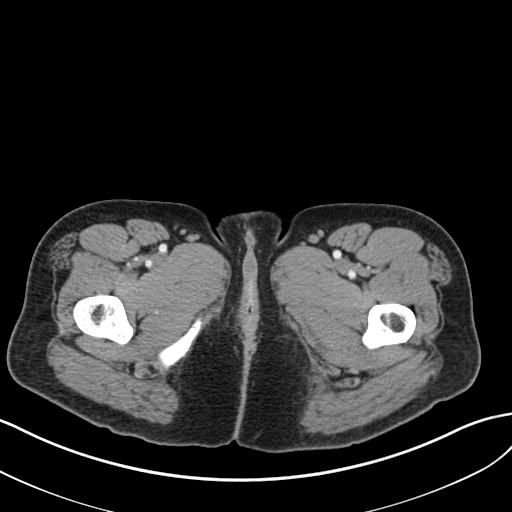
[im 5/81  bone]
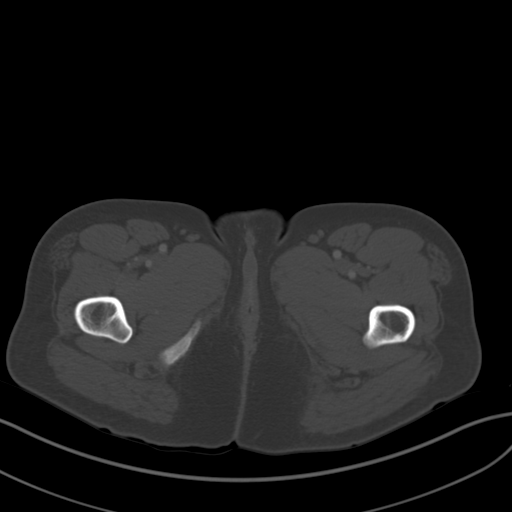
[im 9/81  soft-tissue]
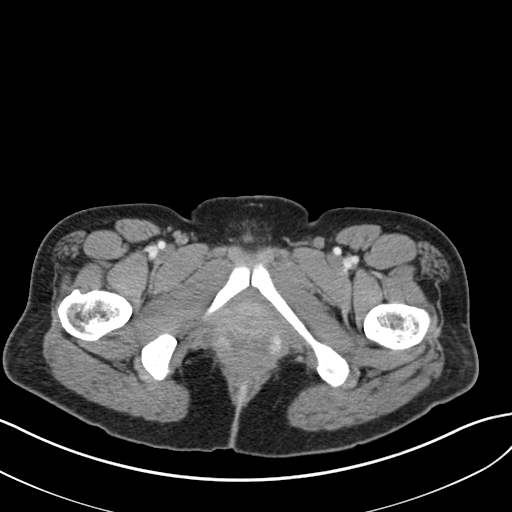
[im 17/81  soft-tissue]
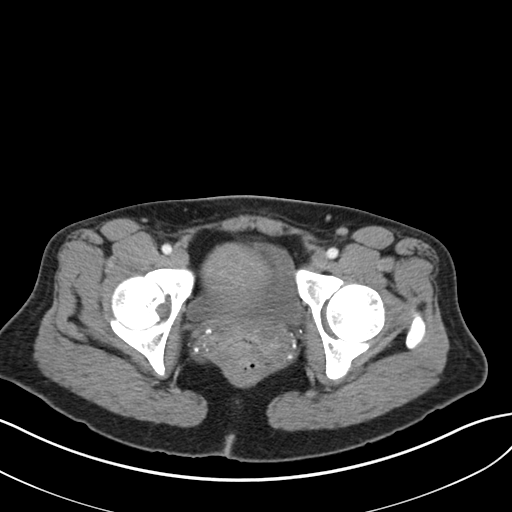
[im 22/81  soft-tissue]
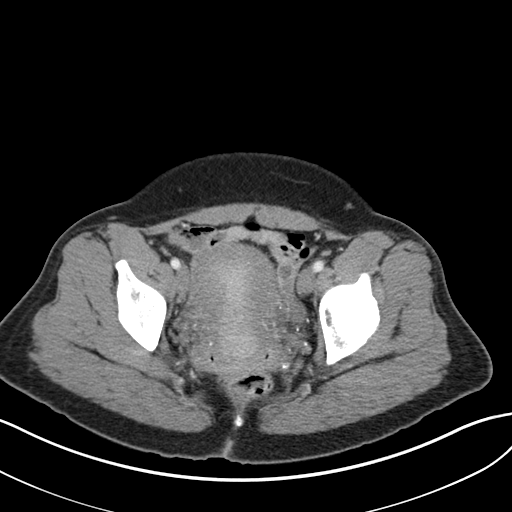
[im 26/81  soft-tissue]
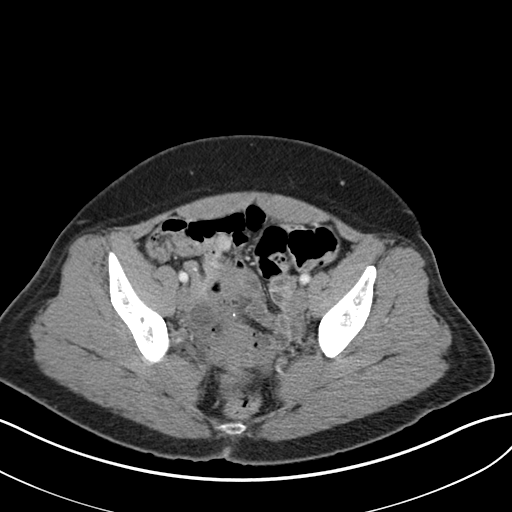
[im 34/81  soft-tissue]
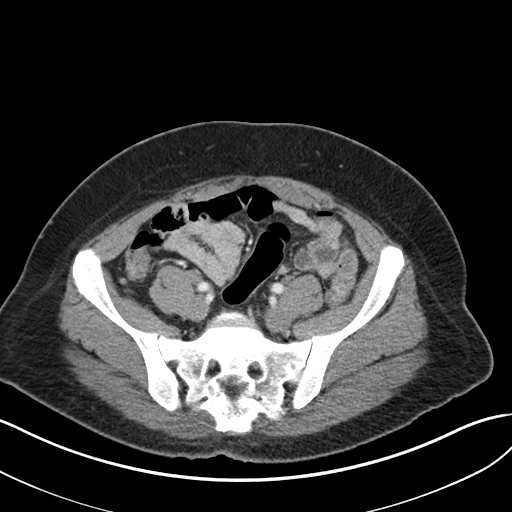
[im 38/81  soft-tissue]
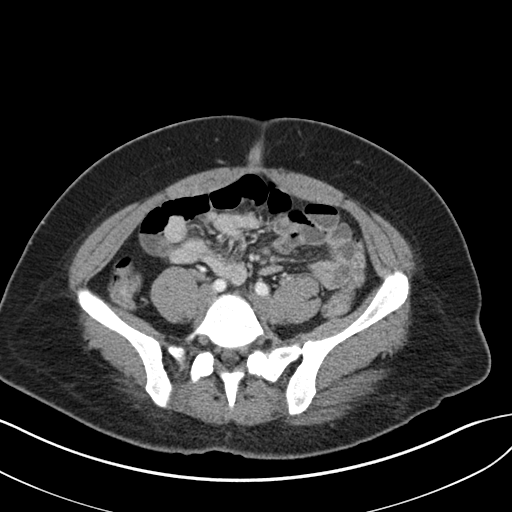
[im 43/81  soft-tissue]
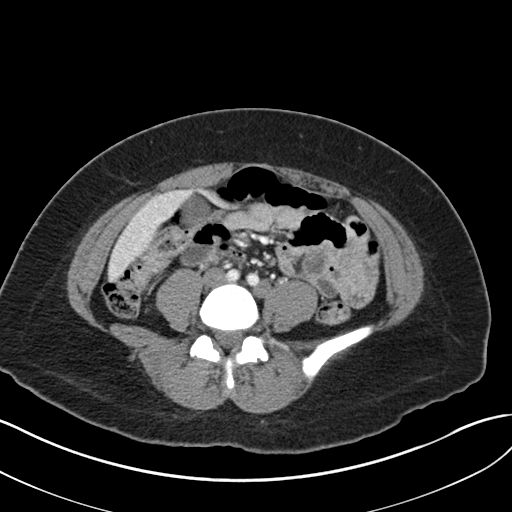
[im 47/81  soft-tissue]
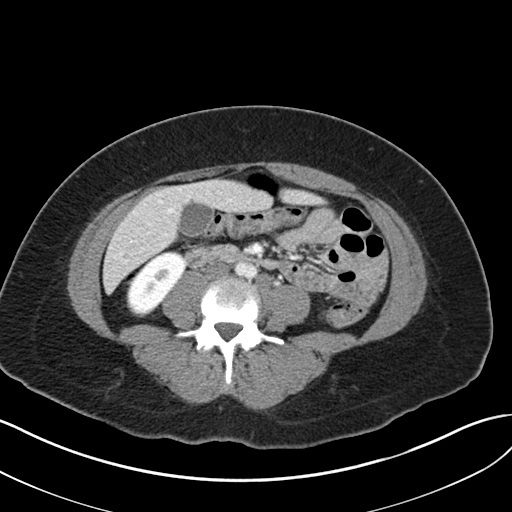
[im 47/81  bone]
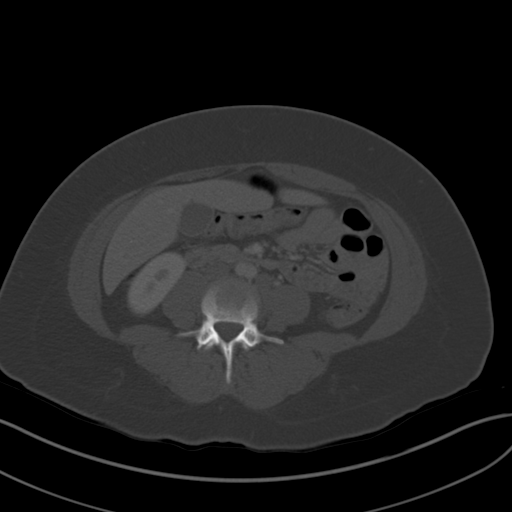
[im 55/81  soft-tissue]
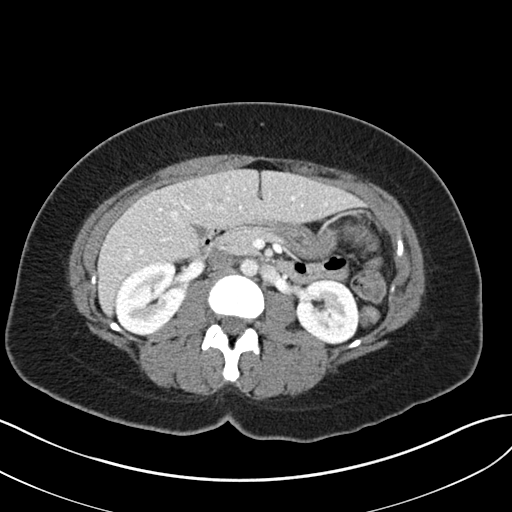
[im 59/81  soft-tissue]
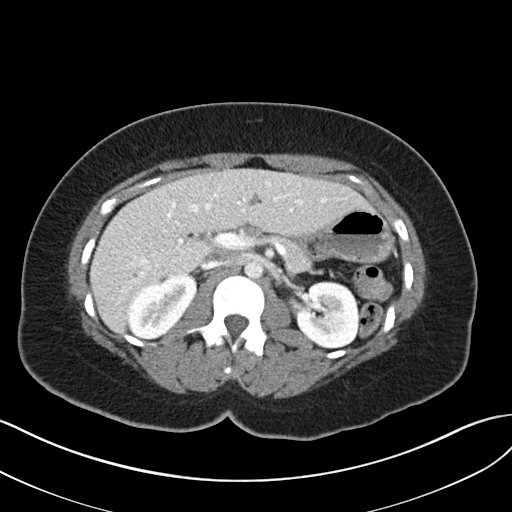
[im 64/81  soft-tissue]
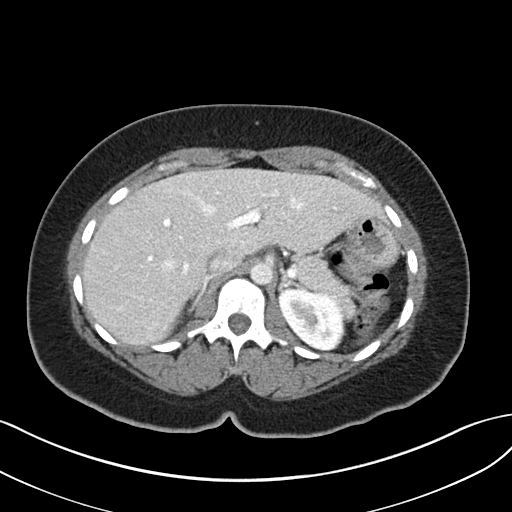
[im 72/81  soft-tissue]
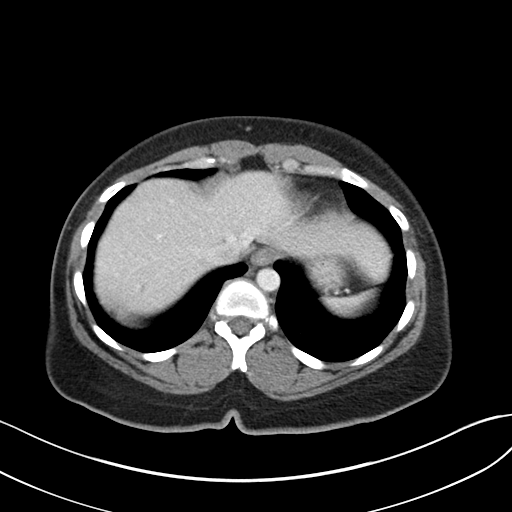
[im 76/81  soft-tissue]
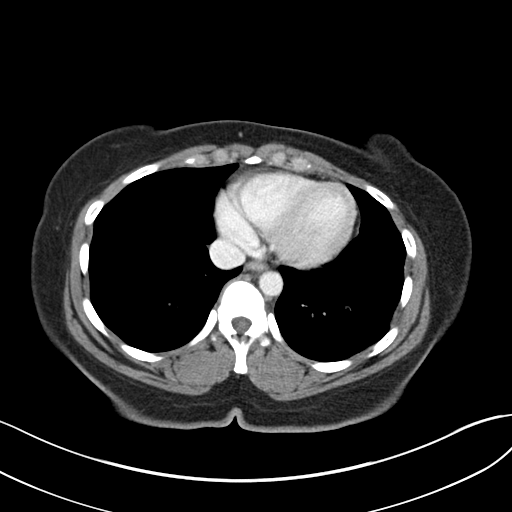

[Series 5: coronal st · coronal · 0.83mm/px · 3 of 151 slices shown]
[im 51/151  soft-tissue]
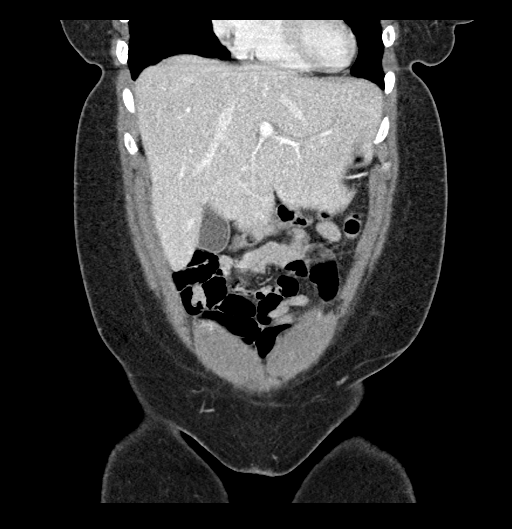
[im 67/151  soft-tissue]
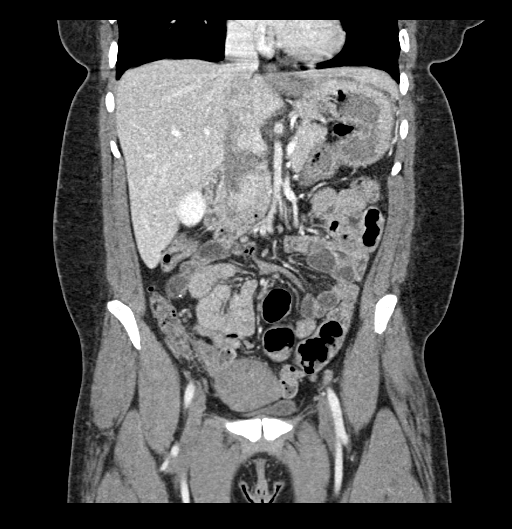
[im 84/151  soft-tissue]
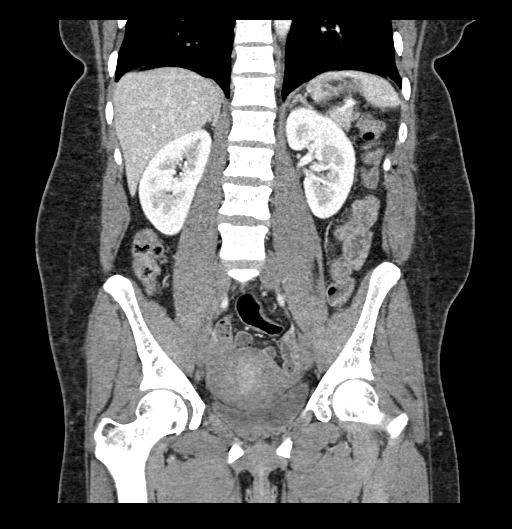

[17 of 46 positions shown; findings below may reference images not displayed]

RADIATION DOSE REDUCTION: This exam was performed according to the
departmental dose-optimization program which includes automated
exposure control, adjustment of the mA and/or kV according to
patient size and/or use of iterative reconstruction technique.

CONTRAST:  100mL OMNIPAQUE IOHEXOL 300 MG/ML  SOLN
FINDINGS: Lower chest: Lung bases are clear.

Hepatobiliary: Liver is within normal limits.

Gallbladder is unremarkable. No intrahepatic or extrahepatic ductal
dilatation.

Pancreas: Within normal limits.

Spleen: Within normal limits.

Adrenals/Urinary Tract: Adrenal glands are within normal limits.

Kidneys are within normal limits.  No hydronephrosis.

Bladder is within normal limits.

Stomach/Bowel: Stomach is within normal limits.

No evidence of bowel obstruction.

Normal appendix (series 2/image 49).

No colonic wall thickening or inflammatory changes.

Vascular/Lymphatic: No evidence of abdominal aortic aneurysm.

No suspicious abdominopelvic lymphadenopathy.

Reproductive: Uterus is mildly prominent due to recent gravid state.

Bilateral ovaries are within normal limits.

Other: No abdominopelvic ascites.

Musculoskeletal: Visualized osseous structures are within normal
limits.
IMPRESSION: Negative CT abdomen/pelvis.

## 2022-11-12 ENCOUNTER — Other Ambulatory Visit: Payer: Self-pay

## 2022-11-12 ENCOUNTER — Encounter (HOSPITAL_COMMUNITY): Payer: Self-pay

## 2022-11-12 ENCOUNTER — Emergency Department (HOSPITAL_COMMUNITY): Payer: MEDICAID

## 2022-11-12 ENCOUNTER — Emergency Department (HOSPITAL_COMMUNITY)
Admission: EM | Admit: 2022-11-12 | Discharge: 2022-11-12 | Disposition: A | Discharge status: Home / Self Care | Payer: MEDICAID | Attending: Emergency Medicine | Admitting: Emergency Medicine

## 2022-11-12 DIAGNOSIS — L03116 Cellulitis of left lower limb: Secondary | ICD-10-CM | POA: Insufficient documentation

## 2022-11-12 DIAGNOSIS — R519 Headache, unspecified: Secondary | ICD-10-CM | POA: Diagnosis not present

## 2022-11-12 DIAGNOSIS — S61451A Open bite of right hand, initial encounter: Secondary | ICD-10-CM | POA: Diagnosis not present

## 2022-11-12 DIAGNOSIS — R109 Unspecified abdominal pain: Secondary | ICD-10-CM | POA: Diagnosis not present

## 2022-11-12 DIAGNOSIS — S0003XA Contusion of scalp, initial encounter: Secondary | ICD-10-CM | POA: Diagnosis not present

## 2022-11-12 DIAGNOSIS — W503XXA Accidental bite by another person, initial encounter: Secondary | ICD-10-CM

## 2022-11-12 DIAGNOSIS — Z23 Encounter for immunization: Secondary | ICD-10-CM | POA: Diagnosis not present

## 2022-11-12 DIAGNOSIS — S0011XA Contusion of right eyelid and periocular area, initial encounter: Secondary | ICD-10-CM | POA: Insufficient documentation

## 2022-11-12 DIAGNOSIS — Z91010 Allergy to peanuts: Secondary | ICD-10-CM | POA: Diagnosis not present

## 2022-11-12 DIAGNOSIS — S81802A Unspecified open wound, left lower leg, initial encounter: Secondary | ICD-10-CM | POA: Insufficient documentation

## 2022-11-12 DIAGNOSIS — S8992XA Unspecified injury of left lower leg, initial encounter: Secondary | ICD-10-CM | POA: Diagnosis present

## 2022-11-12 LAB — RAPID URINE DRUG SCREEN, HOSP PERFORMED
Amphetamines: NOT DETECTED
Barbiturates: NOT DETECTED
Benzodiazepines: NOT DETECTED
Cocaine: POSITIVE — AB
Opiates: NOT DETECTED
Tetrahydrocannabinol: NOT DETECTED

## 2022-11-12 LAB — COMPREHENSIVE METABOLIC PANEL
ALT: 19 U/L (ref 0–44)
AST: 26 U/L (ref 15–41)
Albumin: 3.6 g/dL (ref 3.5–5.0)
Alkaline Phosphatase: 61 U/L (ref 38–126)
Anion gap: 10 (ref 5–15)
BUN: 10 mg/dL (ref 6–20)
CO2: 22 mmol/L (ref 22–32)
Calcium: 8.4 mg/dL — ABNORMAL LOW (ref 8.9–10.3)
Chloride: 105 mmol/L (ref 98–111)
Creatinine, Ser: 0.85 mg/dL (ref 0.44–1.00)
GFR, Estimated: >60 mL/min (ref >60)
Glucose, Bld: 103 mg/dL — ABNORMAL HIGH (ref 70–99)
Potassium: 3.5 mmol/L (ref 3.5–5.1)
Sodium: 137 mmol/L (ref 135–145)
Total Bilirubin: 1.3 mg/dL — ABNORMAL HIGH (ref 0.3–1.2)
Total Protein: 6.1 g/dL — ABNORMAL LOW (ref 6.5–8.1)

## 2022-11-12 LAB — CBC WITH DIFFERENTIAL/PLATELET
Abs Immature Granulocytes: 0.02 10*3/uL (ref 0.00–0.07)
Basophils Absolute: 0 10*3/uL (ref 0.0–0.1)
Basophils Relative: 0 %
Eosinophils Absolute: 0.1 10*3/uL (ref 0.0–0.5)
Eosinophils Relative: 2 %
HCT: 38 % (ref 36.0–46.0)
Hemoglobin: 12.5 g/dL (ref 12.0–15.0)
Immature Granulocytes: 0 %
Lymphocytes Relative: 44 %
Lymphs Abs: 3 10*3/uL (ref 0.7–4.0)
MCH: 32.4 pg (ref 26.0–34.0)
MCHC: 32.9 g/dL (ref 30.0–36.0)
MCV: 98.4 fL (ref 80.0–100.0)
Monocytes Absolute: 0.5 10*3/uL (ref 0.1–1.0)
Monocytes Relative: 7 %
Neutro Abs: 3.2 10*3/uL (ref 1.7–7.7)
Neutrophils Relative %: 47 %
Platelets: 363 10*3/uL (ref 150–400)
RBC: 3.86 MIL/uL — ABNORMAL LOW (ref 3.87–5.11)
RDW: 12.6 % (ref 11.5–15.5)
WBC: 6.8 10*3/uL (ref 4.0–10.5)
nRBC: 0 % (ref 0.0–0.2)

## 2022-11-12 LAB — HCG, SERUM, QUALITATIVE: Preg, Serum: NEGATIVE

## 2022-11-12 LAB — ETHANOL: Alcohol, Ethyl (B): <10 mg/dL (ref <10)

## 2022-11-12 MED ORDER — IOHEXOL 350 MG/ML SOLN
75.0000 mL | Freq: Once | INTRAVENOUS | Status: AC | PRN
  Administered 2022-11-12: 75 mL via INTRAVENOUS

## 2022-11-12 MED ORDER — SULFAMETHOXAZOLE-TRIMETHOPRIM 800-160 MG PO TABS: 1.0000 | ORAL_TABLET | Freq: Two times a day (BID) | ORAL | 0 refills | Status: DC

## 2022-11-12 MED ORDER — TETANUS-DIPHTH-ACELL PERTUSSIS 5-2.5-18.5 LF-MCG/0.5 IM SUSY
0.5000 mL | PREFILLED_SYRINGE | Freq: Once | INTRAMUSCULAR | Status: AC
  Administered 2022-11-12: 0.5 mL via INTRAMUSCULAR
  Filled 2022-11-12: qty 0.5

## 2022-11-12 MED ORDER — NAPROXEN 500 MG PO TABS: 500.0000 mg | ORAL_TABLET | Freq: Two times a day (BID) | ORAL | 0 refills | Status: DC

## 2022-11-12 MED ORDER — SODIUM CHLORIDE 0.9 % IV SOLN
3.0000 g | Freq: Once | INTRAVENOUS | Status: AC
  Administered 2022-11-12: 3 g via INTRAVENOUS
  Filled 2022-11-12: qty 8

## 2022-11-12 MED ORDER — ALBUTEROL SULFATE HFA 108 (90 BASE) MCG/ACT IN AERS: 1.0000 | INHALATION_SPRAY | Freq: Four times a day (QID) | RESPIRATORY_TRACT | 0 refills | Status: DC | PRN

## 2022-11-12 MED ORDER — CLINDAMYCIN HCL 150 MG PO CAPS: 450.0000 mg | ORAL_CAPSULE | Freq: Three times a day (TID) | ORAL | 0 refills | Status: DC

## 2022-11-12 MED ORDER — KETOROLAC TROMETHAMINE 15 MG/ML IJ SOLN
15.0000 mg | Freq: Once | INTRAMUSCULAR | Status: AC
  Administered 2022-11-12: 15 mg via INTRAMUSCULAR
  Filled 2022-11-12: qty 1

## 2022-11-12 MED ORDER — ALBUTEROL SULFATE HFA 108 (90 BASE) MCG/ACT IN AERS
1.0000 | INHALATION_SPRAY | RESPIRATORY_TRACT | Status: DC
  Administered 2022-11-12: 2 via RESPIRATORY_TRACT
  Filled 2022-11-12 (×2): qty 6.7
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
## 2022-11-12 NOTE — ED Notes (Signed)
Pt anxious to leave and asked to see provider.  Pt getting dressed at this time.
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
## 2022-11-12 NOTE — ED Triage Notes (Signed)
Pt also noted two wounds to her left lower leg that she said were stitched but are currently open, with some purulent appearance and redness on skin around the wounds.
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
## 2022-11-14 ENCOUNTER — Other Ambulatory Visit: Payer: Self-pay

## 2022-11-14 ENCOUNTER — Ambulatory Visit: Payer: MEDICAID | Admitting: Family Medicine

## 2022-11-14 ENCOUNTER — Emergency Department (HOSPITAL_COMMUNITY)
Admission: EM | Admit: 2022-11-14 | Discharge: 2022-11-15 | Disposition: A | Discharge status: Home / Self Care | Payer: MEDICAID | Attending: Emergency Medicine | Admitting: Emergency Medicine

## 2022-11-14 DIAGNOSIS — Z760 Encounter for issue of repeat prescription: Secondary | ICD-10-CM | POA: Insufficient documentation

## 2022-11-14 DIAGNOSIS — M7989 Other specified soft tissue disorders: Secondary | ICD-10-CM | POA: Insufficient documentation

## 2022-11-14 NOTE — ED Triage Notes (Signed)
Patient reports right hand and right lower leg pain onset this week , denies fall or injury,ambulatory .

## 2022-11-14 NOTE — Progress Notes (Deleted)
    SUBJECTIVE:   CHIEF COMPLAINT / HPI:   STI testing, preg test  PERTINENT  PMH / PSH: ***  OBJECTIVE:   LMP 10/17/2022 (Exact Date)   ***  ASSESSMENT/PLAN:   No problem-specific Assessment & Plan notes found for this encounter.     Ivery Quale, MD [facility]

## 2022-11-15 ENCOUNTER — Other Ambulatory Visit (HOSPITAL_COMMUNITY)
Admission: RE | Admit: 2022-11-15 | Discharge: 2022-11-15 | Disposition: A | Discharge status: Home / Self Care | Payer: MEDICAID | Source: Ambulatory Visit | Attending: Family Medicine | Admitting: Family Medicine

## 2022-11-15 ENCOUNTER — Ambulatory Visit (INDEPENDENT_AMBULATORY_CARE_PROVIDER_SITE_OTHER): Payer: MEDICAID | Admitting: Student

## 2022-11-15 VITALS — BP 130/76 | HR 80 | Ht 64.0 in | Wt 144.4 lb

## 2022-11-15 DIAGNOSIS — Z59819 Housing instability, housed unspecified: Secondary | ICD-10-CM

## 2022-11-15 DIAGNOSIS — N926 Irregular menstruation, unspecified: Secondary | ICD-10-CM

## 2022-11-15 DIAGNOSIS — T07XXXA Unspecified multiple injuries, initial encounter: Secondary | ICD-10-CM | POA: Insufficient documentation

## 2022-11-15 DIAGNOSIS — J45909 Unspecified asthma, uncomplicated: Secondary | ICD-10-CM

## 2022-11-15 DIAGNOSIS — N898 Other specified noninflammatory disorders of vagina: Secondary | ICD-10-CM | POA: Diagnosis not present

## 2022-11-15 DIAGNOSIS — Z113 Encounter for screening for infections with a predominantly sexual mode of transmission: Secondary | ICD-10-CM

## 2022-11-15 DIAGNOSIS — F39 Unspecified mood [affective] disorder: Secondary | ICD-10-CM

## 2022-11-15 DIAGNOSIS — N760 Acute vaginitis: Secondary | ICD-10-CM

## 2022-11-15 DIAGNOSIS — J4520 Mild intermittent asthma, uncomplicated: Secondary | ICD-10-CM

## 2022-11-15 DIAGNOSIS — B9689 Other specified bacterial agents as the cause of diseases classified elsewhere: Secondary | ICD-10-CM

## 2022-11-15 DIAGNOSIS — T148XXA Other injury of unspecified body region, initial encounter: Secondary | ICD-10-CM

## 2022-11-15 LAB — POCT WET PREP (WET MOUNT)
Clue Cells Wet Prep Whiff POC: POSITIVE
Trichomonas Wet Prep HPF POC: ABSENT

## 2022-11-15 LAB — POCT URINE PREGNANCY: Preg Test, Ur: NEGATIVE

## 2022-11-15 MED ORDER — AMOXICILLIN-POT CLAVULANATE 875-125 MG PO TABS: 1.0000 | ORAL_TABLET | Freq: Two times a day (BID) | ORAL | 0 refills | Status: DC

## 2022-11-15 MED ORDER — ALBUTEROL SULFATE (2.5 MG/3ML) 0.083% IN NEBU
2.5000 mg | INHALATION_SOLUTION | Freq: Four times a day (QID) | RESPIRATORY_TRACT | 1 refills | Status: DC | PRN
Start: 2022-11-15 — End: 2023-03-17

## 2022-11-15 MED ORDER — METRONIDAZOLE 0.75 % VA GEL
1.0000 | Freq: Every day | VAGINAL | 0 refills | Status: DC
Start: 2022-11-15 — End: 2023-03-06

## 2022-11-15 MED ORDER — ALBUTEROL SULFATE HFA 108 (90 BASE) MCG/ACT IN AERS
1.0000 | INHALATION_SPRAY | Freq: Four times a day (QID) | RESPIRATORY_TRACT | 0 refills | Status: DC | PRN
Start: 2022-11-15 — End: 2022-11-21

## 2022-11-15 MED ORDER — AMOXICILLIN-POT CLAVULANATE 875-125 MG PO TABS
1.0000 | ORAL_TABLET | Freq: Once | ORAL | Status: AC
  Administered 2022-11-15: 1 via ORAL
  Filled 2022-11-15: qty 1
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
## 2022-11-15 NOTE — Discharge Instructions (Addendum)
You can take your prescription to any pharmacy to be filled.  Please follow-up with your doctor or return to the ER for new or worsening symptoms.
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
## 2022-11-16 DIAGNOSIS — Z113 Encounter for screening for infections with a predominantly sexual mode of transmission: Secondary | ICD-10-CM | POA: Insufficient documentation

## 2022-11-16 DIAGNOSIS — N926 Irregular menstruation, unspecified: Secondary | ICD-10-CM | POA: Insufficient documentation

## 2022-11-16 DIAGNOSIS — N898 Other specified noninflammatory disorders of vagina: Secondary | ICD-10-CM | POA: Insufficient documentation

## 2022-11-16 DIAGNOSIS — B9689 Other specified bacterial agents as the cause of diseases classified elsewhere: Secondary | ICD-10-CM | POA: Insufficient documentation

## 2022-11-16 DIAGNOSIS — N760 Acute vaginitis: Secondary | ICD-10-CM | POA: Insufficient documentation

## 2022-11-16 DIAGNOSIS — T148XXA Other injury of unspecified body region, initial encounter: Secondary | ICD-10-CM | POA: Insufficient documentation

## 2022-11-16 LAB — CBC WITH DIFFERENTIAL/PLATELET
Basophils Absolute: 0 10*3/uL (ref 0.0–0.2)
Basos: 1 %
EOS (ABSOLUTE): 0.2 10*3/uL (ref 0.0–0.4)
Eos: 3 %
Hematocrit: 36.5 % (ref 34.0–46.6)
Hemoglobin: 12.2 g/dL (ref 11.1–15.9)
Immature Grans (Abs): 0 10*3/uL (ref 0.0–0.1)
Immature Granulocytes: 0 %
Lymphocytes Absolute: 2.5 10*3/uL (ref 0.7–3.1)
Lymphs: 40 %
MCH: 32.8 pg (ref 26.6–33.0)
MCHC: 33.4 g/dL (ref 31.5–35.7)
MCV: 98 fL — ABNORMAL HIGH (ref 79–97)
Monocytes Absolute: 0.4 10*3/uL (ref 0.1–0.9)
Monocytes: 7 %
Neutrophils Absolute: 3.1 10*3/uL (ref 1.4–7.0)
Neutrophils: 49 %
Platelets: 374 10*3/uL (ref 150–450)
RBC: 3.72 x10E6/uL — ABNORMAL LOW (ref 3.77–5.28)
RDW: 11.9 % (ref 11.7–15.4)
WBC: 6.2 10*3/uL (ref 3.4–10.8)

## 2022-11-16 LAB — COMPREHENSIVE METABOLIC PANEL
ALT: 14 IU/L (ref 0–32)
AST: 19 IU/L (ref 0–40)
Albumin: 4.4 g/dL (ref 3.9–4.9)
Alkaline Phosphatase: 77 IU/L (ref 44–121)
BUN/Creatinine Ratio: 13 (ref 9–23)
BUN: 10 mg/dL (ref 6–20)
Bilirubin Total: 0.6 mg/dL (ref 0.0–1.2)
CO2: 20 mmol/L (ref 20–29)
Calcium: 8.9 mg/dL (ref 8.7–10.2)
Chloride: 106 mmol/L (ref 96–106)
Creatinine, Ser: 0.78 mg/dL (ref 0.57–1.00)
Globulin, Total: 2 g/dL (ref 1.5–4.5)
Glucose: 69 mg/dL — ABNORMAL LOW (ref 70–99)
Potassium: 4.3 mmol/L (ref 3.5–5.2)
Sodium: 140 mmol/L (ref 134–144)
Total Protein: 6.4 g/dL (ref 6.0–8.5)
eGFR: 100 mL/min/{1.73_m2} (ref >59)

## 2022-11-16 LAB — HIV ANTIBODY (ROUTINE TESTING W REFLEX): HIV Screen 4th Generation wRfx: NONREACTIVE

## 2022-11-16 LAB — RPR: RPR Ser Ql: NONREACTIVE

## 2022-11-16 NOTE — Assessment & Plan Note (Signed)
Resources provided along with bus passes.

## 2022-11-16 NOTE — Assessment & Plan Note (Addendum)
No purulent drainage concerning for MRSA.  Some concern for cellulitis due to swelling but no erythema.  No systemic symptoms today. -Continue treatment with Augmentin -Can consider adding MRSA coverage with doxycycline at future visit if indicated. -ED/return precautions given -Follow-up on 8/27 with PCP

## 2022-11-16 NOTE — Assessment & Plan Note (Signed)
Appears to be healing.  Still has some swelling but no erythema or warmth which is reassuring.  Good grip strength and full range of motion. -Hand surgery referral placed -Patient advised to finish full course of Augmentin

## 2022-11-16 NOTE — Assessment & Plan Note (Signed)
No concern for PID. -Swabs for GC chlamydia and trichomoniasis -HIV and RPR
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
## 2022-11-16 NOTE — Assessment & Plan Note (Signed)
Wet prep positive for BV which could explain her malodorous discharge. -Rx for MetroGel
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
## 2022-11-16 NOTE — Assessment & Plan Note (Signed)
No current symptoms of exacerbation but is out of inhaler/nebulizers which she needs. -Albuterol inhaler refilled -Order placed for nebulizer with albuterol solution

## 2022-11-18 LAB — CERVICOVAGINAL ANCILLARY ONLY
Chlamydia: NEGATIVE
Comment: NEGATIVE
Comment: NEGATIVE
Comment: NORMAL
Neisseria Gonorrhea: NEGATIVE
Trichomonas: NEGATIVE

## 2022-11-20 ENCOUNTER — Telehealth: Payer: Self-pay

## 2022-11-20 ENCOUNTER — Ambulatory Visit: Payer: MEDICAID | Admitting: Plastic Surgery

## 2022-11-20 ENCOUNTER — Encounter: Payer: Self-pay | Admitting: Student

## 2022-11-20 DIAGNOSIS — J4520 Mild intermittent asthma, uncomplicated: Secondary | ICD-10-CM

## 2022-11-20 NOTE — Telephone Encounter (Signed)
Community message sent to [employer] for nebulizer.   Received confirmation from [employer] that they have received order.   Veronda Prude, RN

## 2022-11-21 ENCOUNTER — Other Ambulatory Visit (HOSPITAL_COMMUNITY): Payer: Self-pay

## 2022-11-21 NOTE — Addendum Note (Signed)
Addended by: Veronda Prude on: 11/21/2022 04:14 PM   Modules accepted: Orders

## 2022-11-26 ENCOUNTER — Ambulatory Visit: Payer: Self-pay | Admitting: Family Medicine

## 2022-11-26 MED ORDER — ALBUTEROL SULFATE HFA 108 (90 BASE) MCG/ACT IN AERS
1.0000 | INHALATION_SPRAY | Freq: Four times a day (QID) | RESPIRATORY_TRACT | 3 refills | Status: DC | PRN
Start: 2022-11-26 — End: 2023-03-17

## 2022-12-12 ENCOUNTER — Ambulatory Visit: Payer: MEDICAID | Admitting: Plastic Surgery

## 2022-12-26 ENCOUNTER — Ambulatory Visit: Payer: MEDICAID | Admitting: Family Medicine

## 2023-01-01 ENCOUNTER — Ambulatory Visit: Payer: MEDICAID | Admitting: Plastic Surgery

## 2023-01-06 ENCOUNTER — Ambulatory Visit: Payer: MEDICAID | Admitting: Family Medicine

## 2023-03-06 ENCOUNTER — Encounter: Payer: Self-pay | Admitting: Family Medicine

## 2023-03-06 ENCOUNTER — Ambulatory Visit: Payer: MEDICAID | Admitting: Family Medicine

## 2023-03-06 ENCOUNTER — Other Ambulatory Visit (HOSPITAL_COMMUNITY)
Admission: RE | Admit: 2023-03-06 | Discharge: 2023-03-06 | Disposition: A | Discharge status: Home / Self Care | Payer: MEDICAID | Source: Ambulatory Visit | Attending: Family Medicine | Admitting: Family Medicine

## 2023-03-06 VITALS — BP 106/84 | HR 124 | Ht 64.0 in | Wt 139.2 lb

## 2023-03-06 DIAGNOSIS — Z3200 Encounter for pregnancy test, result unknown: Secondary | ICD-10-CM | POA: Diagnosis not present

## 2023-03-06 DIAGNOSIS — Z139 Encounter for screening, unspecified: Secondary | ICD-10-CM | POA: Diagnosis not present

## 2023-03-06 DIAGNOSIS — N898 Other specified noninflammatory disorders of vagina: Secondary | ICD-10-CM | POA: Insufficient documentation

## 2023-03-06 LAB — POCT WET PREP (WET MOUNT)
Clue Cells Wet Prep Whiff POC: POSITIVE
Trichomonas Wet Prep HPF POC: ABSENT

## 2023-03-06 LAB — POCT URINE PREGNANCY: Preg Test, Ur: NEGATIVE

## 2023-03-06 MED ORDER — METRONIDAZOLE 500 MG PO TABS: 500.0000 mg | ORAL_TABLET | Freq: Two times a day (BID) | ORAL | 0 refills | Status: DC
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
## 2023-03-06 NOTE — Assessment & Plan Note (Addendum)
Physical exam and Wet Prep consistent with BV, including positive whiff test and clue cells.  - Full STI/STD panel ordered including HIV/RPR - Pap smear collected as well given unknown last pap smear  - Prescribed metronidazole 500 BID x 7 days - Called patient to discuss BV results, unable to reach patient. Left voicemail and sent patient a detailed MyChart message.

## 2023-03-06 NOTE — Assessment & Plan Note (Signed)
Patient with concern for possible pregnancy. Urine pregnancy test negative today in clinic.  - Called patient to discuss negative pregnancy results, unable to reach patient. Left voicemail and sent patient a detailed MyChart message.  - Consider prenatal planning at next appt if patient remains interested in pregnancy

## 2023-03-06 NOTE — Patient Instructions (Addendum)
Thank you for visiting clinic today - it is always our pleasure to care for you.  Today we discussed your recent discharge symptoms and collected some routine STI/STD screening as well as pregnancy test. I will contact you with the results.  The social work team will also be reaching out to you soon for further support.   Here are your upcoming appointments: Future Appointments  Date Time Provider Department Center  03/17/2023  8:45 AM Ivery Quale, MD [facility]  04/11/2023  3:30 PM Ivery Quale, MD [facility]   Reach out any time with any questions or concerns you may have - we are here for you!  Ivery Quale, MD [facility] [phone]
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
## 2023-03-10 ENCOUNTER — Other Ambulatory Visit: Payer: Self-pay | Admitting: Family Medicine

## 2023-03-10 LAB — CERVICOVAGINAL ANCILLARY ONLY
Chlamydia: NEGATIVE
Comment: NEGATIVE
Comment: NEGATIVE
Comment: NORMAL
Neisseria Gonorrhea: NEGATIVE
Trichomonas: NEGATIVE

## 2023-03-10 LAB — CYTOLOGY - PAP
Chlamydia: NEGATIVE
Comment: NEGATIVE
Comment: NEGATIVE
Comment: NEGATIVE
Comment: NORMAL
Diagnosis: NEGATIVE
High risk HPV: POSITIVE — AB
Neisseria Gonorrhea: NEGATIVE
Trichomonas: NEGATIVE

## 2023-03-10 MED ORDER — METRONIDAZOLE 0.75 % EX GEL: 1.0000 | Freq: Every day | CUTANEOUS | 0 refills | Status: AC

## 2023-03-11 NOTE — Progress Notes (Signed)
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  Called patient and notified her of results.  She verified her birthday.  She would like to proceed with colposcopy.  Will route this and hopefully get her scheduled in our colposcopy clinic soon.  She did say if we are unable to reach her by phone, to call her sister Sarita Haver who is listed as her emergency contact.

## 2023-03-13 ENCOUNTER — Telehealth: Payer: Self-pay

## 2023-03-13 NOTE — Telephone Encounter (Signed)
Received message from Dr. Rexene Alberts regarding scheduling patient for colposcopy.   Called patient at her number and sister's number regarding scheduling appointment in colpo clinic.   She did not answer, unable to LVM on patient's phone. Left HIPAA compliant VM on sister's phone requesting returned call to schedule appointment.   Veronda Prude, RN

## 2023-03-17 ENCOUNTER — Ambulatory Visit (HOSPITAL_COMMUNITY)
Admission: RE | Admit: 2023-03-17 | Discharge: 2023-03-17 | Disposition: A | Discharge status: Home / Self Care | Payer: MEDICAID | Source: Ambulatory Visit | Attending: Family Medicine | Admitting: Family Medicine

## 2023-03-17 ENCOUNTER — Other Ambulatory Visit: Payer: Self-pay

## 2023-03-17 ENCOUNTER — Ambulatory Visit: Payer: MEDICAID | Admitting: Family Medicine

## 2023-03-17 ENCOUNTER — Other Ambulatory Visit: Payer: MEDICAID

## 2023-03-17 VITALS — BP 112/84 | HR 90 | Ht 62.0 in | Wt 135.6 lb

## 2023-03-17 DIAGNOSIS — Z803 Family history of malignant neoplasm of breast: Secondary | ICD-10-CM

## 2023-03-17 DIAGNOSIS — Z23 Encounter for immunization: Secondary | ICD-10-CM

## 2023-03-17 DIAGNOSIS — R87810 Cervical high risk human papillomavirus (HPV) DNA test positive: Secondary | ICD-10-CM | POA: Diagnosis not present

## 2023-03-17 DIAGNOSIS — R079 Chest pain, unspecified: Secondary | ICD-10-CM | POA: Insufficient documentation

## 2023-03-17 DIAGNOSIS — J4520 Mild intermittent asthma, uncomplicated: Secondary | ICD-10-CM

## 2023-03-17 DIAGNOSIS — F333 Major depressive disorder, recurrent, severe with psychotic symptoms: Secondary | ICD-10-CM

## 2023-03-17 MED ORDER — ALBUTEROL SULFATE (2.5 MG/3ML) 0.083% IN NEBU: 2.5000 mg | INHALATION_SOLUTION | Freq: Four times a day (QID) | RESPIRATORY_TRACT | 3 refills | Status: DC | PRN

## 2023-03-17 MED ORDER — ALBUTEROL SULFATE HFA 108 (90 BASE) MCG/ACT IN AERS: 1.0000 | INHALATION_SPRAY | Freq: Four times a day (QID) | RESPIRATORY_TRACT | 3 refills | Status: DC | PRN

## 2023-03-17 NOTE — Progress Notes (Signed)
SUBJECTIVE:   CHIEF COMPLAINT / HPI:   HPV on Pap Smear Patient presenting today to discuss recent pap smear results. She has questions about HPV and colposcopy. She would like HPV vaccine if possible as she does not think she had this done as a child.   Chest pain Patient describes intermittent sternal chest pain in the sternal region, happening 2-3 times per week over the last few months. No radiation of pain elsewhere. Sometimes worse after eating. Does report intermittent rapid heartbeats. Does report chest pain today.   MDD/Anxiety/PTSD/Cluster B Patient with complex psych history loss to follow up for a few years. Patient has not taken psych meds in years. Is interested in connecting with behavioral health provider again. No SI/HI today.  Family hx of breast cancer Patient also has concerns about breast cancer screening today. She recalls her mother was diagnosed with breast cancer in her 30s and unfortunately passed later. Patient reports a having a mammogram done in the past but does not recall the results.   Asthma  Tobacco use Patient requesting albuterol and nebulizer refills today. Reports relief with these. Has not received previously ordered nebulizer dme equipment. Is interested in tobacco cessation.   PERTINENT  PMH / PSH: Abnormal pap smear, MDD/Anxiety/PTSD/Cluster B, Asthma, Tobacco use  OBJECTIVE:   Pulse 90   Ht 5\' 2"  (1.575 m)   Wt 135 lb 9.6 oz (61.5 kg)   LMP 12/21/2022   SpO2 99%   BMI 24.80 kg/m   General: Well-appearing. Resting comfortably in room. CV: Normal S1/S2. No extra heart sounds. Warm and well-perfused. Normal radial and DP pulses bilaterally. Some reproducible chest pain to palpation of sternum.  Pulm: Breathing comfortably on room air. CTAB. No wheezing or crackles. No increased WOB. Abd: Soft, mildly tender throughout, non-distended. No significant CVA tenderness.  Skin:  Warm, dry. 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 Has not previously received HPV vaccines.  - Discussed and answered patient's questions regarding HPV and HPV vaccination - First dose of HPV vaccine today. Plan for 3 dose course.  - Scheduled patient for colposcopy visit 12/19 for further workup. Discussed and answered patient's questions regarding colposcopy process and possible biopsy.  Chest pain, unspecified type EKG unremarkable in clinic today, reassuring against ACS. Differential includes costochondritis given some reproducible chest pain, possibly anginal pain secondary to chronic tobacco use, possibly GERD symptoms given sternal location and occasional worsening after eating.   - CTM at this time  - Discussed ED precautions  Major depressive disorder, recurrent severe without psychotic features (HCC) PHQ-9 with score of 20 today. No SI/HI. Patient is interested in connecting with behavioral health resources again.  - Provided behavioral health resources for patient with AVS.  - Consider medication at follow up if not started by behavioral health  Intermittent asthma without complication, unspecified asthma severity Patient with history of asthma that has responded well to albuterol. Discussed smoking cessation. Unclear history of PFTs - patient reports possibly getting this done before, however, unable to find results in patient chart. - Refilled patient's albuterol inhaler and nebulizer - Discussed previous DME order for nebulizer equipment with patient. Patient prefers using sister's address for delivery.  - Scheduled patient for smoking cessation appointment with Dr Raymondo Band on 12/19 - Consider PFTs and further COPD workup at next visit  Family history of  breast cancer in mother Given patient's immediate family history of breast cancer at a young age, recommend early screening  mammogram at this time. Prior mammogram results not found in patient chart.  - Placed order for screening mammogram  Encounter for immunization Patient received flu and covid vaccines in clinic today without complication.    Next PCP clinic visit scheduled for January 10th for continued follow up.   Ivery Quale, MD [facility]

## 2023-03-17 NOTE — Patient Instructions (Addendum)
Therapy and Counseling Resources Most providers on this list will take Medicaid. Patients with commercial insurance or Medicare should contact their insurance company to get a list of in network providers.  [facility]:Psychiatry and Counseling [address] [facility] [address] [place], [place] [address] [phone]  [facility]  [address], [address] [place], [place] [address]      [phone]  [facility] [address]  [place], [place] [address] [phone]  [facility] [address]  [place], [place] [address]       [phone]     [facility] (virtual only) [phone]  Jovita Kussmaul [facility] [address], [place], [place] [phone]  [facility]:  [address] [place] [phone]  [facility]:  [address] [place] [place] [phone]  [facility] (under & uninsured) [address]   [place] [phone]    [email or web address]    [facility] [address]  [place]    [phone]  [facility] -[address]     Phone:  [phone]     [facility]-  [address]  [phone]   [facility] [address]      [place], [place] [phone]  [facility]: [address] [place], [place], [place]  [phone]   [facility] (Spanish therapist) [email or web address]  [address] [place]  [address]   [place] [address]    [phone]    [facility]   [address],  [place], [place]  [phone]   [facility]  [address] [place]  [phone]  [facility]  [address] [place], [place]        [phone]  Open Access/Walk In Clinic under & uninsured  [facility]  [address] [place] [address] [place], [place]  [phone] Crisis [phone]  [facility] [place],  (Spanish)   [address], [place]: [phone]) 8:30 - 12; 1 - 2:30  [facility] [employer],  [address] [place], [place]    [phone]):8:30 - 12; 2 - 3PM  [facility] [employer],  [address]  [place]; [phone]):   Mon - Fri 8 AM - 5 PM  [facility] [address] [place]  MWF 12:30 to 3:00 or call to schedule an appointment  [phone]  Specific Provider options Psychology Today  [email or web address] click on find a therapist  enter your zip code left side and select or tailor a therapist for your specific need.   [facility] Provider Directory [email or web address]  (Medicaid)   Follow all drop down to find a provider  Social Support program [facility] [place] [phone] or [employer] [address], [place], [place] Recovery support and educational   24- Hour Availability:   [facility]  [address] [place], [place] [phone] Crisis [phone]  [facility] [phone]  [facility]  [phone]   [facility]  [phone] (after hours)  [facility] Alternative/Mobile Crisis   [phone]  [place] National Suicide Hotline  [phone] Len Childs)  Call 911 or go to emergency room  [facility]  [phone]);  [place] and [employer]  [phone]); [place],  [place]delia Mems, [place], [place], [place], [place] Thank you for visiting clinic today - it is always our pleasure to care for you.  Your second dose of HPV vaccine will be due on 04/14/23. You can schedule a nurse visit for this injection.   Your EKG today was normal. Please continue to monitor your chest pain symptoms and we will follow up at your next visit.  Reach out any  time with any questions or concerns you may have - we are here for you!  Ivery Quale, MD [facility] [phone]

## 2023-03-18 DIAGNOSIS — Z23 Encounter for immunization: Secondary | ICD-10-CM | POA: Insufficient documentation

## 2023-03-18 DIAGNOSIS — Z803 Family history of malignant neoplasm of breast: Secondary | ICD-10-CM | POA: Insufficient documentation

## 2023-03-18 DIAGNOSIS — R079 Chest pain, unspecified: Secondary | ICD-10-CM | POA: Insufficient documentation

## 2023-03-18 DIAGNOSIS — R87810 Cervical high risk human papillomavirus (HPV) DNA test positive: Secondary | ICD-10-CM | POA: Insufficient documentation

## 2023-03-18 LAB — TREPONEMAL ANTIBODIES, TPPA: Treponemal Antibodies, TPPA: NONREACTIVE

## 2023-03-18 LAB — RPR W/REFLEX TO TREPSURE: RPR: NONREACTIVE

## 2023-03-18 LAB — HIV ANTIBODY (ROUTINE TESTING W REFLEX): HIV Screen 4th Generation wRfx: NONREACTIVE

## 2023-03-18 NOTE — Assessment & Plan Note (Signed)
PHQ-9 with score of 20 today. No SI/HI. Patient is interested in connecting with behavioral health resources again.  - Provided behavioral health resources for patient with AVS.  - Consider medication at follow up if not started by behavioral health

## 2023-03-18 NOTE — Assessment & Plan Note (Signed)
Patient received flu and covid vaccines in clinic today without complication.

## 2023-03-18 NOTE — Assessment & Plan Note (Signed)
Given patient's immediate family history of breast cancer at a young age, recommend early screening mammogram at this time. Prior mammogram results not found in patient chart.  - Placed order for screening mammogram

## 2023-03-18 NOTE — Assessment & Plan Note (Addendum)
Patient with history of asthma that has responded well to albuterol. Discussed smoking cessation. Unclear history of PFTs - patient reports possibly getting this done before, however, unable to find results in patient chart. - Refilled patient's albuterol inhaler and nebulizer - Discussed previous DME order for nebulizer equipment with patient. Patient prefers using sister's address for delivery.  - Scheduled patient for smoking cessation appointment with Dr Raymondo Band on 12/19 - Consider PFTs and further COPD workup at next visit

## 2023-03-18 NOTE — Assessment & Plan Note (Signed)
EKG unremarkable in clinic today, reassuring against ACS. Differential includes costochondritis given some reproducible chest pain, possibly anginal pain secondary to chronic tobacco use, possibly GERD symptoms given sternal location and occasional worsening after eating.   - CTM at this time  - Discussed ED precautions

## 2023-03-18 NOTE — Assessment & Plan Note (Signed)
Patient pap smear from earlier this month positive for high risk HPV, NILM. Has not previously received HPV vaccines.  - Discussed and answered patient's questions regarding HPV and HPV vaccination - First dose of HPV vaccine today. Plan for 3 dose course.  - Scheduled patient for colposcopy visit 12/19 for further workup. Discussed and answered patient's questions regarding colposcopy process and possible biopsy.

## 2023-03-20 ENCOUNTER — Ambulatory Visit (INDEPENDENT_AMBULATORY_CARE_PROVIDER_SITE_OTHER): Payer: MEDICAID | Admitting: Family Medicine

## 2023-03-20 ENCOUNTER — Encounter: Payer: Self-pay | Admitting: Pharmacist

## 2023-03-20 ENCOUNTER — Ambulatory Visit: Payer: MEDICAID | Admitting: Pharmacist

## 2023-03-20 VITALS — BP 104/80 | HR 94 | Wt 135.0 lb

## 2023-03-20 DIAGNOSIS — R102 Pelvic and perineal pain: Secondary | ICD-10-CM

## 2023-03-20 DIAGNOSIS — Z720 Tobacco use: Secondary | ICD-10-CM

## 2023-03-20 DIAGNOSIS — B977 Papillomavirus as the cause of diseases classified elsewhere: Secondary | ICD-10-CM

## 2023-03-20 LAB — POCT URINE PREGNANCY: Preg Test, Ur: NEGATIVE

## 2023-03-20 MED ORDER — VARENICLINE TARTRATE 0.5 MG PO TABS: 0.5000 mg | ORAL_TABLET | Freq: Two times a day (BID) | ORAL | 6 refills | Status: DC

## 2023-03-20 MED ORDER — IBUPROFEN 200 MG PO TABS: 600.0000 mg | ORAL_TABLET | Freq: Once | ORAL | Status: DC

## 2023-03-20 NOTE — Patient Instructions (Addendum)
Today you had a colposcopy procedure done and everything looks normal.  We did not do any biopsies so there are no restrictions.  I would recommend you have a regular Pap smear in 1 year so please make an appointment in about a year we will do a Pap for you.  Have a happy holiday season!

## 2023-03-20 NOTE — Assessment & Plan Note (Signed)
Tobacco use disorder with nicotine dependence since age of 52. Patient who is fair candidate for success because of lack of success in the past, polysubstance use disorder, homeless, other smokers (fiance) who she shares cigarettes.  Tried Nicotine replacement (gum, lozenges, patch) and bupropion in the past.    -Initiated varenicline 0.5 mg by mouth once daily with food with future plan, if tolerated to increase to  0.5 mg by mouth twice daily.  Patient counseled on purpose, proper use, and potential adverse effects, including GI upset. - Encouraged patient to consider use of 1 800-QUIT NOW for support with intake reduction.  She has used this resource in the past.   - We discussed PFT at next follow-up visit with me to evaluate lung function given chronic dyspnea.

## 2023-03-20 NOTE — Progress Notes (Signed)
S:   Chief Complaint  Patient presents with   Medication Management    Tobacco Use Disorder - Homeless   37 y.o. female who presents for evaluation/assistance with tobacco dependence.  PMH is significant for polysubstance abuse, mental illness, homelessness.  Patient was referred and last seen by Primary Care Provider, Dr. Threasa Beards, on 03/17/2023.   At last visit, tobacco intake reduction/cessation and lung function testing were discussed.   Age when started using tobacco on a daily basis 9. Brand smoked Newport 100s and cheaper Crown.. Number of cigarettes/day 40.  Estimated nicotine content per cigarette (mg) >1mg  per.  Estimated nicotine intake per day > 40mg .   Reports Smokes >2 times per night.    Fagerstrom Score Question Scoring Patient Score  How soon after waking do you smoke your first cigarette? <5 mins (3) 5-30 mins (2) 31-60 mins (1) >60 mins (0) 3  Do you find it difficult NOT to smoke in places where you shouldn't? Yes(1) No (0) 1  Which cigarette would you most hate to give up? First one in AM (1) Any other one (0)  0  How many cigarettes do you smoke/day? 10 or less (0) 11-20 (1) 21-30 (2) >30 (3) 3  Do you smoke more during the first few hours after waking? Yes (1) No (0) 0  Do you smoke if you are so ill you cannot get out of bed? Yes (1) No (0) 1    Total Score   8  Score interpretation: low 1-2, low-to-moderate 3-4, moderate 5-7, high >7  Longest time ever been tobacco free only during hospitalization / rehab.  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 Negative for hemoptysis.     Physical Exam Constitutional:      Appearance: Normal appearance.  Pulmonary:     Effort: Pulmonary effort is normal.  Neurological:     Mental Status: She is alert.  Psychiatric:        Mood and Affect: Mood normal.        Thought Content: Thought content normal.        Judgment: Judgment normal.     Patient is participating in a Managed Medicaid Plan:  Yes - working on returning to [place] for a [employer]   A/P: Tobacco use disorder with nicotine dependence since age of 60. Patient who is fair candidate for success because of lack of success in the past, polysubstance use disorder, homeless, other smokers (fiance) who she shares cigarettes.  Tried Nicotine replacement (gum, lozenges, patch) and bupropion in the past.    -Initiated varenicline 0.5 mg by mouth once daily with food with future plan, if tolerated to increase to  0.5 mg by mouth twice daily.  Patient counseled on purpose, proper use, and potential adverse effects, including GI upset. - Encouraged  patient to consider use of 1 800-QUIT NOW for support with intake reduction.  She has used this resource in the past.   - We discussed PFT at next follow-up visit with me to evaluate lung function given chronic dyspnea.  Written patient instructions provided. Patient verbalized understanding of treatment plan.  Total time in face to face counseling 37 minutes.    Follow-up:  Pharmacist 1 month PCP clinic visit PRN

## 2023-03-20 NOTE — Progress Notes (Signed)
Reviewed and agree with Dr Koval's plan.   

## 2023-03-20 NOTE — Patient Instructions (Signed)
Nice to see you today!  Medication Changes: START Varenicline 0.5mg  once daily with FOOD   Try to cut down over the next few weeks.    Continue all other medication the same.    Tobacco Patient Instructions  Quitting smoking is one of the most important decisions you can make for your current and future health. Consider what you dislike about smoking and how quitting could personally benefit you. Try to cut down. Aim for reducing the amount you smoke by several cigarettes over the next several weeks.  My target quit date is 2025  Starting today, Be a Quitter!  Remind yourself why you want to quit.  Delay your first cigarette of the day for as long as possible.  Start cleaning out all pockets, drawers, and your car of cigarettes.  Getting Through the Cravings Once You Are Smoke Free: Each craving will last about 10 minutes, whether or not you smoke. Here's how to get through the cravings without cigarettes:  DELAY: Tell yourself that you'll wait for the next craving. Do it every time! DEEP BREATHS: One reason smoking feels good is because you breathe in deeply to inhale. Take four slow, deep breaths and feel the relaxation without the hamful effects of cigarettes. DRINK WATER: Drink a glass of cool water. It will give your hands and mouth something to do and will help flush the nicotine out of your system faster. DIVERT: Do something else -- brush your teeth, take a walk, call a friend who can offer you support. Just moving onto something other than thinking about cigarettes will move you through the craving.   Frequently Asked Questions  What can I do when I get the urge to smoke? To get through the urge to smoke, try the following:  Review your reasons for quitting and think of all the benefits to your health, your finances, and your family.  Remind yourself that there is no such thing as just one cigarette -- or even one puff.  Ride out the desire to smoke. Use the 4 Os --  Delay, Deep Breaths, Drink Water and Divert to get you through. The craving will go away eventually. Do not fool yourself into thinking you can have just one cigarette.  Any tips on how to deal with stress? Stress is a natural part of life. The key is to deal with it without reaching for a cigarette. Taking deep breaths, counting backwards from 10 and asking yourself 1-how big a deal is this?"  Writing down your feelings, talking with a friend and doing things like positive self-talk and meditation are some other ways that people deal with daily stress.  What if I start smoking again? Slips happen. Most people try to quit smoking a few times before they are successful. Don't beat yourself up if this happens to you! Ask yourself if this was a slip or a relapse. A slip is a one-time mistake that is quickly corrected. A relapse is going back to your old smoking habits.   If you slip, don't give up. Think of it as a learning experience. Ask yourself what went wrong and renew your commitment to staying away from smoking for good.  If you relapse, try not to get discouraged. Ask yourself the question "What caused me to start smoking?" Figure out what helped you and what didn't when you tried to quit. Knowing why you relapsed is useful information for your next attempt to quit.

## 2023-03-20 NOTE — Progress Notes (Signed)
   Here for colposcopy  Lockie Mola, MD [facility]

## 2023-03-21 NOTE — Progress Notes (Signed)
Patient given informed consent, signed copy in the chart.  Placed in lithotomy position. Cervix viewed with speculum and colposcope after application of acetic acid.   Colposcopy adequate (entire squamocolumnar junctions seen  in entirety) ?  YES Acetowhite lesions?NO Punctation?NO Mosaicism?  NO Abnormal vasculature?  NO Biopsies?NONE ECC?NO Complications? NONE. Patient had some mild pelvic cramping after colposcopy so she was given ibuprofen 800 mg PO,  COMMENTS: Patient was given post procedure instructions.   Recommend repeat pap in one year with cotesting

## 2023-04-11 ENCOUNTER — Ambulatory Visit: Payer: Self-pay | Admitting: Family Medicine

## 2023-04-15 ENCOUNTER — Encounter: Payer: Self-pay | Admitting: Family Medicine

## 2023-04-22 ENCOUNTER — Ambulatory Visit: Payer: MEDICAID | Admitting: Pharmacist

## 2023-05-08 ENCOUNTER — Ambulatory Visit: Payer: MEDICAID | Admitting: Family Medicine

## 2023-05-14 ENCOUNTER — Telehealth: Payer: Self-pay | Admitting: Family Medicine

## 2023-05-14 NOTE — Telephone Encounter (Signed)
Attempted to call patient about new clinical dismissal policy. Unable to reach. Left voicemail. Will plan to discuss with patient at her upcoming appt on 2/20.

## 2023-05-22 ENCOUNTER — Ambulatory Visit: Payer: MEDICAID | Admitting: Pharmacist

## 2023-05-22 ENCOUNTER — Ambulatory Visit: Payer: MEDICAID | Admitting: Family Medicine

## 2023-05-22 ENCOUNTER — Telehealth: Payer: Self-pay

## 2023-05-22 ENCOUNTER — Telehealth: Payer: Self-pay | Admitting: Pharmacist
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
## 2023-05-22 NOTE — Telephone Encounter (Signed)
Attempted to call patient to reschedule appt due to weather. No answer. LVM. Aquilla Solian, CMA

## 2023-05-26 ENCOUNTER — Telehealth: Payer: Self-pay | Admitting: Pharmacist

## 2023-05-26 NOTE — Telephone Encounter (Signed)
 Attempted to contact patient for follow-up of tobacco cessation and PFTs.   Left HIPAA compliant voice mail requesting call back to direct phone: [phone]  Total time with patient call and documentation of interaction: 2 minutes.

## 2023-06-24 ENCOUNTER — Telehealth: Payer: Self-pay | Admitting: Pharmacist
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
## 2023-07-17 ENCOUNTER — Ambulatory Visit: Payer: MEDICAID | Admitting: Pharmacist

## 2023-07-17 ENCOUNTER — Ambulatory Visit: Payer: MEDICAID | Admitting: Family Medicine

## 2023-08-21 ENCOUNTER — Ambulatory Visit: Payer: MEDICAID | Admitting: Pharmacist

## 2023-08-21 ENCOUNTER — Ambulatory Visit: Payer: MEDICAID | Admitting: Family Medicine

## 2023-09-25 ENCOUNTER — Ambulatory Visit: Payer: MEDICAID | Admitting: Pharmacist

## 2023-09-25 ENCOUNTER — Ambulatory Visit: Payer: MEDICAID | Admitting: Family Medicine

## 2023-10-01 ENCOUNTER — Ambulatory Visit: Payer: MEDICAID | Admitting: Family Medicine

## 2023-10-01 ENCOUNTER — Ambulatory Visit: Payer: MEDICAID | Admitting: Pharmacist

## 2023-10-09 ENCOUNTER — Encounter: Payer: Self-pay | Admitting: Family Medicine

## 2023-10-09 ENCOUNTER — Ambulatory Visit: Payer: MEDICAID | Admitting: Family Medicine

## 2023-10-09 ENCOUNTER — Encounter: Payer: Self-pay | Admitting: Pharmacist

## 2023-10-09 ENCOUNTER — Other Ambulatory Visit (HOSPITAL_COMMUNITY)
Admission: RE | Admit: 2023-10-09 | Discharge: 2023-10-09 | Disposition: A | Discharge status: Home / Self Care | Payer: MEDICAID | Source: Ambulatory Visit | Attending: Family Medicine | Admitting: Family Medicine

## 2023-10-09 ENCOUNTER — Ambulatory Visit: Payer: MEDICAID | Admitting: Pharmacist

## 2023-10-09 VITALS — BP 118/89 | HR 80 | Ht 62.0 in | Wt 122.0 lb

## 2023-10-09 DIAGNOSIS — R110 Nausea: Secondary | ICD-10-CM | POA: Diagnosis not present

## 2023-10-09 DIAGNOSIS — Z720 Tobacco use: Secondary | ICD-10-CM | POA: Diagnosis not present

## 2023-10-09 DIAGNOSIS — N898 Other specified noninflammatory disorders of vagina: Secondary | ICD-10-CM | POA: Insufficient documentation

## 2023-10-09 DIAGNOSIS — Z113 Encounter for screening for infections with a predominantly sexual mode of transmission: Secondary | ICD-10-CM

## 2023-10-09 DIAGNOSIS — J4520 Mild intermittent asthma, uncomplicated: Secondary | ICD-10-CM | POA: Diagnosis not present

## 2023-10-09 DIAGNOSIS — R350 Frequency of micturition: Secondary | ICD-10-CM | POA: Diagnosis not present

## 2023-10-09 DIAGNOSIS — Z23 Encounter for immunization: Secondary | ICD-10-CM

## 2023-10-09 LAB — POCT URINALYSIS DIP (MANUAL ENTRY)
Bilirubin, UA: NEGATIVE
Blood, UA: NEGATIVE
Glucose, UA: NEGATIVE mg/dL
Ketones, POC UA: NEGATIVE mg/dL
Leukocytes, UA: NEGATIVE
Nitrite, UA: NEGATIVE
Protein Ur, POC: NEGATIVE mg/dL
Spec Grav, UA: 1.025 (ref 1.010–1.025)
Urobilinogen, UA: 0.2 U/dL
pH, UA: 5.5 (ref 5.0–8.0)

## 2023-10-09 LAB — POCT WET PREP (WET MOUNT)
Clue Cells Wet Prep Whiff POC: POSITIVE
Trichomonas Wet Prep HPF POC: ABSENT
WBC, Wet Prep HPF POC: >20

## 2023-10-09 LAB — POCT URINE PREGNANCY: Preg Test, Ur: NEGATIVE

## 2023-10-09 MED ORDER — ALBUTEROL SULFATE (2.5 MG/3ML) 0.083% IN NEBU: 2.5000 mg | INHALATION_SOLUTION | Freq: Four times a day (QID) | RESPIRATORY_TRACT | 3 refills | Status: AC | PRN

## 2023-10-09 MED ORDER — ONDANSETRON 4 MG PO TBDP: 4.0000 mg | ORAL_TABLET | Freq: Three times a day (TID) | ORAL | 0 refills | Status: DC | PRN

## 2023-10-09 MED ORDER — ALBUTEROL SULFATE HFA 108 (90 BASE) MCG/ACT IN AERS: 1.0000 | INHALATION_SPRAY | Freq: Four times a day (QID) | RESPIRATORY_TRACT | 3 refills | Status: AC | PRN

## 2023-10-09 MED ORDER — METRONIDAZOLE 0.75 % VA GEL: 1.0000 | Freq: Every day | VAGINAL | 0 refills | Status: DC

## 2023-10-09 MED ORDER — EPINEPHRINE 0.3 MG/0.3ML IJ SOAJ: 0.3000 mg | INTRAMUSCULAR | 5 refills | Status: AC | PRN

## 2023-10-09 MED ORDER — PRENATAL VITAMIN 27-0.8 MG PO TABS: 1.0000 | ORAL_TABLET | Freq: Every day | ORAL | 3 refills | Status: DC

## 2023-10-09 MED ORDER — METRONIDAZOLE 500 MG PO TABS: 500.0000 mg | ORAL_TABLET | Freq: Two times a day (BID) | ORAL | 0 refills | Status: DC

## 2023-10-09 MED ORDER — VARENICLINE TARTRATE 0.5 MG PO TABS: 0.5000 mg | ORAL_TABLET | Freq: Two times a day (BID) | ORAL | 6 refills | Status: DC

## 2023-10-09 MED ORDER — FLUTICASONE PROPIONATE 50 MCG/ACT NA SUSP: 2.0000 | Freq: Two times a day (BID) | NASAL | 6 refills | Status: AC | PRN

## 2023-10-09 MED ORDER — BUDESONIDE-FORMOTEROL FUMARATE 80-4.5 MCG/ACT IN AERO
2.0000 | INHALATION_SPRAY | Freq: Two times a day (BID) | RESPIRATORY_TRACT | 3 refills | Status: AC
Start: 2023-10-09 — End: ?

## 2023-10-09 NOTE — Patient Instructions (Signed)
 Thank you for visiting clinic today and allowing us  to participate in your care!  We completed routine STI/STD testing and pregnancy testing and I will contact you with results.  I have refilled your medications and sent an Zofran  as needed for nausea.  Please schedule an appointment as needed.  Reach out any time with any questions or concerns you may have - we are here for you!  Damien Cassis, MD [facility] [phone]

## 2023-10-09 NOTE — Assessment & Plan Note (Signed)
 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  Tried Nicotine  replacement (gum, lozenges, patch) and bupropion in the past.    - Re-Initiated varenicline  0.5 mg by mouth once daily with food with future plan, if tolerated to increase to  0.5 mg by mouth twice daily.  Patient counseled on purpose, proper use, and potential adverse effects, including GI upset.

## 2023-10-09 NOTE — Progress Notes (Signed)
    SUBJECTIVE:   CHIEF COMPLAINT / HPI:   Vaginal discharge - Requests routine sti/std testing  - Intermittent thick discharge - Intermittent itchiness - No bleeding  - No pain  - No dysuria, some increased frequency  - LMP 09/03/23 - has some nausea, would like pregnancy test  - Is ambivalent about becoming pregnancy at this time, not on BC   PERTINENT  PMH / PSH: Hx HPV, abnormal pap smear, MDD/Anxiety/PTSD/Cluster B  OBJECTIVE:   BP 118/89   Pulse 80   Ht 5' 2 (1.575 m)   Wt 122 lb (55.3 kg)   LMP  (LMP Unknown)   SpO2 100%   BMI 22.31 kg/m    General: Well-appearing. Resting comfortably in room. CV: Warm and well-perfused. Pulm: Breathing comfortably on room air. No increased WOB. Abd: Soft, non-tender, non-distended. Skin:  Warm, dry. GU: Normal appearing cervix. White discharge within vaginal vault.    ASSESSMENT/PLAN:   Assessment & Plan Vaginal discharge Wet prep indicative of BV. Urine pregnancy test negative. UA unremarkable. - Called patient after visit to discuss BV results and sent for metronidazole  vaginal applicator x 5d per patient preference  - GC, HIV, RPR pending  - Patient declines BC at this time, discussed and sent for PNV  - Zofran  odt for nausea prn  - Received second dose of HPV vaccine today, will need one more to complete the course    Refilled patient Epi pen, albuterol  neb/inhaler, chantix  per request.   RTC as needed.   Damien Cassis, MD [facility]

## 2023-10-09 NOTE — Patient Instructions (Signed)
 Nice to see you today!   Our short-term goal is less than 1 pack per day and less will be celebrated!   Medication Changes: - START Varenicline  0.5mg  once daily WITH FOOD at this time.   - START Flonase  nasal spray - 1-2 sprays once or twice daily to control symptoms.   - START Symbicort  2 inhalations twice daily (rinse after use)  You can also use this as needed for symptoms.   - Continue all other medication the same.   Tobacco Patient Instructions  Quitting smoking is one of the most important decisions you can make for your current and future health. Consider what you dislike about smoking and how quitting could personally benefit you. Try to cut down.   Aim for reducing the amount you smoke to less than 1 pack in the next few weeks.  Starting today, Be a Quitter!  Remind yourself why you want to quit.  Delay your first cigarette of the day for as long as possible.  Start cleaning out all pockets, drawers, and your car of cigarettes.  Getting Through the Cravings Once You Are Smoke Free: Each craving will last about 10 minutes, whether or not you smoke. Here's how to get through the cravings without cigarettes:  DELAY: Tell yourself that you'll wait for the next craving. Do it every time! DEEP BREATHS: One reason smoking feels good is because you breathe in deeply to inhale. Take four slow, deep breaths and feel the relaxation without the hamful effects of cigarettes. DRINK WATER: Drink a glass of cool water. It will give your hands and mouth something to do and will help flush the nicotine  out of your system faster. DIVERT: Do something else -- brush your teeth, take a walk, call a friend who can offer you support. Just moving onto something other than thinking about cigarettes will move you through the craving.   Frequently Asked Questions  What can I do when I get the urge to smoke? To get through the urge to smoke, try the following:  Review your reasons for quitting and  think of all the benefits to your health, your finances, and your family.  Remind yourself that there is no such thing as just one cigarette -- or even one puff.  Ride out the desire to smoke. Use the 4 Os -- Delay, Deep Breaths, Drink Water and Divert to get you through. The craving will go away eventually. Do not fool yourself into thinking you can have just one cigarette.  Any tips on how to deal with stress? Stress is a natural part of life. The key is to deal with it without reaching for a cigarette. Taking deep breaths, counting backwards from 10 and asking yourself 1-how big a deal is this?"  Writing down your feelings, talking with a friend and doing things like positive self-talk and meditation are some other ways that people deal with daily stress.  What if I start smoking again? Slips happen. Most people try to quit smoking a few times before they are successful. Don't beat yourself up if this happens to you! Ask yourself if this was a slip or a relapse. A slip is a one-time mistake that is quickly corrected. A relapse is going back to your old smoking habits.   If you slip, don't give up. Think of it as a learning experience. Ask yourself what went wrong and renew your commitment to staying away from smoking for good.  If you relapse, try not to  get discouraged. Ask yourself the question "What caused me to start smoking?" Figure out what helped you and what didn't when you tried to quit. Knowing why you relapsed is useful information for your next attempt to quit.  Please bring all medications to your clinic visits.  Please arrive 10-15 minutes prior to your scheduled visit time.

## 2023-10-09 NOTE — Assessment & Plan Note (Addendum)
 Wet prep indicative of BV. Urine pregnancy test negative. UA unremarkable. - Called patient after visit to discuss BV results and sent for metronidazole  vaginal applicator x 5d per patient preference  - GC, HIV, RPR pending  - Patient declines BC at this time, discussed and sent for PNV  - Zofran  odt for nausea prn  - Received second dose of HPV vaccine today, will need one more to complete the course

## 2023-10-09 NOTE — Progress Notes (Signed)
 S:   Chief Complaint  Patient presents with   Medication Management    Tobacco Use - intake reduction   38 y.o. female who presents for evaluation/assistance with tobacco dependence.  PMH is significant for polysubstance abuse, mental illness, homelessness, food insecurity.   Patient was referred and last seen by Primary Care Provider, Dr. Diona, earlier today.    At last visit with me in last 2024, tobacco intake reduction/cessation was discussed and varenicline  was initiated. Unfortunately, patient has food insecurity and avoided taking the medication when food was unavailable to her.    Age when started using tobacco on a daily basis 9. Brand smoked Newport 100s and cheaper Crown.. Number of cigarettes/day 30.  Estimated nicotine  content per cigarette (mg) >1mg  per.  Estimated nicotine  intake per day ~ 30mg .     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  Has Fiance - he has 7 children - he is also homeless (he has quit smoking since my last contact with patient) She has 3 children ages 59 years to < 55 year old (all live with patients' sister)  Rates CONFIDENCE of REDUCING tobacco intake to less than 1 ppd in the next month as 10 on 1-10 scale.  Most common triggers to use tobacco include; Anxiety - Stress   Motivation to quit: Breathing / Health  O:  Review of Systems  Respiratory:  Positive for shortness of breath.   Psychiatric/Behavioral:  The patient is nervous/anxious.    All other systems reviewed and are negative.   Physical Exam Vitals reviewed.  Pulmonary:     Effort: Pulmonary effort is normal.  Neurological:     Mental Status: She is alert.  Psychiatric:        Mood and Affect: Mood normal.        Behavior: Behavior normal.        Thought Content: Thought content normal.     A/P: Tobacco use disorder with moderate/severe nicotine  dependence since age 22 in a patient who is fair candidate for success because of multiple stressors and lack of success with quitting in the past.  Tried Nicotine  replacement (gum, lozenges, patch) and bupropion in the past.    - Re-Initiated varenicline  0.5 mg by mouth once daily with food with future plan, if tolerated to increase to  0.5 mg by mouth twice daily.  Patient counseled on purpose, proper use, and potential adverse effects, including GI upset.  Shortness of Breath in patient the history of asthma.  Willing to trial use of combination steroid/LABA inhaler.  - START Symbicort  2 inhalations BID and PRN   Allergic rhinitis symptoms improved in the past with use of nasal steroid. - START Fluticasone  nasal spray  Food insecurity - provided assistance with help of CMA while in office.   Written patient instructions provided. Patient verbalized understanding of treatment plan.  Total time in face to face counseling 29 minutes.    Follow-up:  Pharmacist 1  month PCP clinic visit same day - 1 month

## 2023-10-10 ENCOUNTER — Ambulatory Visit: Payer: Self-pay | Admitting: Family Medicine

## 2023-10-10 ENCOUNTER — Telehealth: Payer: Self-pay | Admitting: Family Medicine

## 2023-10-10 LAB — CERVICOVAGINAL ANCILLARY ONLY
Chlamydia: NEGATIVE
Comment: NEGATIVE
Comment: NEGATIVE
Comment: NORMAL
Neisseria Gonorrhea: NEGATIVE
Trichomonas: NEGATIVE

## 2023-10-10 LAB — SYPHILIS: RPR W/REFLEX TO RPR TITER AND TREPONEMAL ANTIBODIES, TRADITIONAL SCREENING AND DIAGNOSIS ALGORITHM: RPR Ser Ql: NONREACTIVE

## 2023-10-10 LAB — HIV ANTIBODY (ROUTINE TESTING W REFLEX): HIV Screen 4th Generation wRfx: NONREACTIVE

## 2023-10-10 NOTE — Telephone Encounter (Signed)
 Called and spoke with patient as patient received both oral and vaginal Flagyl  from pharmacy. I previously spoke with her pharmacy who said she could not return the oral Flagyl  for a refund as she did not have a copay to begin with. I informed patient to discard the oral flagyl  and use only the vaginal flagyl . Patient expressed understanding. Answers addressed.

## 2023-10-10 NOTE — Progress Notes (Signed)
 Reviewed and agree with Dr Macky Lower plan.

## 2023-10-13 MED ORDER — NAPROXEN 500 MG PO TABS: 500.0000 mg | ORAL_TABLET | Freq: Two times a day (BID) | ORAL | 0 refills | Status: AC

## 2023-10-20 ENCOUNTER — Encounter: Payer: Self-pay | Admitting: Family Medicine

## 2023-11-03 ENCOUNTER — Other Ambulatory Visit: Payer: Self-pay | Admitting: Family Medicine

## 2023-11-03 DIAGNOSIS — J4520 Mild intermittent asthma, uncomplicated: Secondary | ICD-10-CM

## 2023-11-03 NOTE — Progress Notes (Signed)
Nebulizer order placed

## 2023-11-03 NOTE — Telephone Encounter (Signed)
 Sent order to [facility] with directions to ship to sister's address.   Chiquita JAYSON English, RN

## 2023-11-04 ENCOUNTER — Emergency Department (HOSPITAL_COMMUNITY)
Admission: EM | Admit: 2023-11-04 | Discharge: 2023-11-04 | Disposition: A | Discharge status: Home / Self Care | Payer: MEDICAID

## 2023-11-04 ENCOUNTER — Emergency Department (HOSPITAL_COMMUNITY): Payer: MEDICAID

## 2023-11-04 ENCOUNTER — Other Ambulatory Visit: Payer: Self-pay

## 2023-11-04 ENCOUNTER — Encounter (HOSPITAL_COMMUNITY): Payer: Self-pay

## 2023-11-04 DIAGNOSIS — S0012XA Contusion of left eyelid and periocular area, initial encounter: Secondary | ICD-10-CM | POA: Insufficient documentation

## 2023-11-04 DIAGNOSIS — S62616A Displaced fracture of proximal phalanx of right little finger, initial encounter for closed fracture: Secondary | ICD-10-CM | POA: Diagnosis not present

## 2023-11-04 DIAGNOSIS — R109 Unspecified abdominal pain: Secondary | ICD-10-CM | POA: Insufficient documentation

## 2023-11-04 DIAGNOSIS — S0990XA Unspecified injury of head, initial encounter: Secondary | ICD-10-CM | POA: Diagnosis not present

## 2023-11-04 DIAGNOSIS — S6991XA Unspecified injury of right wrist, hand and finger(s), initial encounter: Secondary | ICD-10-CM | POA: Diagnosis present

## 2023-11-04 DIAGNOSIS — R079 Chest pain, unspecified: Secondary | ICD-10-CM | POA: Diagnosis not present

## 2023-11-04 DIAGNOSIS — M25531 Pain in right wrist: Secondary | ICD-10-CM | POA: Diagnosis not present

## 2023-11-04 DIAGNOSIS — Z91010 Allergy to peanuts: Secondary | ICD-10-CM | POA: Diagnosis not present

## 2023-11-04 LAB — I-STAT CHEM 8, ED
BUN: 10 mg/dL (ref 6–20)
Calcium, Ion: 1.13 mmol/L — ABNORMAL LOW (ref 1.15–1.40)
Chloride: 104 mmol/L (ref 98–111)
Creatinine, Ser: 0.8 mg/dL (ref 0.44–1.00)
Glucose, Bld: 74 mg/dL (ref 70–99)
HCT: 41 % (ref 36.0–46.0)
Hemoglobin: 13.9 g/dL (ref 12.0–15.0)
Potassium: 4.1 mmol/L (ref 3.5–5.1)
Sodium: 138 mmol/L (ref 135–145)
TCO2: 23 mmol/L (ref 22–32)

## 2023-11-04 LAB — HCG, SERUM, QUALITATIVE: Preg, Serum: NEGATIVE

## 2023-11-04 MED ORDER — IOHEXOL 350 MG/ML SOLN
75.0000 mL | Freq: Once | INTRAVENOUS | Status: AC | PRN
  Administered 2023-11-04: 75 mL via INTRAVENOUS

## 2023-11-04 MED ORDER — ONDANSETRON HCL 4 MG/2ML IJ SOLN
4.0000 mg | Freq: Once | INTRAMUSCULAR | Status: AC
  Administered 2023-11-04: 4 mg via INTRAVENOUS
  Filled 2023-11-04: qty 2

## 2023-11-04 MED ORDER — FENTANYL CITRATE PF 50 MCG/ML IJ SOSY
25.0000 ug | PREFILLED_SYRINGE | Freq: Once | INTRAMUSCULAR | Status: AC
  Administered 2023-11-04: 25 ug via INTRAVENOUS
  Filled 2023-11-04: qty 1

## 2023-11-04 MED ORDER — KETOROLAC TROMETHAMINE 15 MG/ML IJ SOLN
15.0000 mg | Freq: Once | INTRAMUSCULAR | Status: AC
  Administered 2023-11-04: 15 mg via INTRAVENOUS
  Filled 2023-11-04: qty 1

## 2023-11-04 MED ORDER — FENTANYL CITRATE PF 50 MCG/ML IJ SOSY
50.0000 ug | PREFILLED_SYRINGE | Freq: Once | INTRAMUSCULAR | Status: AC
  Administered 2023-11-04: 50 ug via INTRAVENOUS
  Filled 2023-11-04: qty 1

## 2023-11-04 MED ORDER — OXYCODONE HCL 5 MG PO TABS: 5.0000 mg | ORAL_TABLET | ORAL | 0 refills | Status: DC | PRN

## 2023-11-04 NOTE — Progress Notes (Signed)
 Orthopedic Tech Progress Note Patient Details:  Delorice Bannister [facility] 10-09-1985 [id number]  Ortho Devices Type of Ortho Device: Ace wrap, Cotton web roll, Ulna gutter splint Ortho Device/Splint Location: RLE Ortho Device/Splint Interventions: Ordered, Application, Adjustment   Post Interventions Patient Tolerated: Well Instructions Provided: Care of device  Delanna LITTIE Pac 11/04/2023, 4:50 PM

## 2023-11-04 NOTE — ED Notes (Signed)
 Patient Alert and oriented to baseline. Stable and ambulatory to baseline. Patient verbalized understanding of the discharge instructions.  Patient belongings were taken by the patient.
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
## 2023-11-04 NOTE — Discharge Instructions (Addendum)
 Your workup showed fracture to the left middle finger.  Splint applied.  Follow-up with Dr. Romona regarding this fracture.  You need to wear the splint at all times, do not get it wet.  We have given you a prescription for oxycodone  which is a narcotic pain medicine you can take as prescribed as needed for severe pain only.  Do not drive or operate heavy machinery while taking this medication as it can be sedating.  Please use acetaminophen  (Tylenol ) or ibuprofen  (Advil , Motrin ) for pain.  You may use 800 mg ibuprofen  every 6 hours or 1000 mg of acetaminophen  every 6 hours.  You may choose to alternate between the two, this would be most effective. Do not exceed 4000 mg of acetaminophen  within 24 hours.  Do not exceed 3200 mg ibuprofen  within 24 hours.  Remainder of your CT scan did not show any concerning findings. Return for any emergent symptoms.
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
## 2023-11-12 ENCOUNTER — Ambulatory Visit: Payer: MEDICAID | Admitting: Family Medicine

## 2023-11-12 ENCOUNTER — Encounter: Payer: Self-pay | Admitting: Family Medicine

## 2023-11-12 ENCOUNTER — Encounter: Payer: Self-pay | Admitting: Pharmacist

## 2023-11-12 ENCOUNTER — Ambulatory Visit: Payer: MEDICAID | Admitting: Pharmacist

## 2023-11-12 DIAGNOSIS — Z59819 Housing instability, housed unspecified: Secondary | ICD-10-CM

## 2023-11-12 DIAGNOSIS — Z720 Tobacco use: Secondary | ICD-10-CM

## 2023-11-12 DIAGNOSIS — F4310 Post-traumatic stress disorder, unspecified: Secondary | ICD-10-CM

## 2023-11-12 DIAGNOSIS — J4520 Mild intermittent asthma, uncomplicated: Secondary | ICD-10-CM | POA: Diagnosis not present

## 2023-11-12 DIAGNOSIS — F411 Generalized anxiety disorder: Secondary | ICD-10-CM

## 2023-11-12 DIAGNOSIS — F209 Schizophrenia, unspecified: Secondary | ICD-10-CM | POA: Insufficient documentation

## 2023-11-12 MED ORDER — HYDROXYZINE HCL 10 MG PO TABS
10.0000 mg | ORAL_TABLET | Freq: Three times a day (TID) | ORAL | 0 refills | Status: AC | PRN
Start: 2023-11-12 — End: ?

## 2023-11-12 MED ORDER — ONDANSETRON 4 MG PO TBDP: 4.0000 mg | ORAL_TABLET | Freq: Three times a day (TID) | ORAL | 0 refills | Status: DC | PRN

## 2023-11-12 NOTE — Assessment & Plan Note (Addendum)
 [facility] referral placed for SDOH.

## 2023-11-12 NOTE — Patient Instructions (Addendum)
 Nice to see you today! We'll prioritize your healing currently before we focus on cutting back on cigarettes in the next 4-6 weeks.   Medication Changes: - Continue all other medication the same.   Tobacco Patient Instructions  Quitting smoking is one of the most important decisions you can make for your current and future health. Consider what you dislike about smoking and how quitting could personally benefit you. Try to cut down.   Aim for reducing the amount you smoke over the next 4-6 weeks.  Starting today, Be a Quitter!  Remind yourself why you want to quit.  Delay your first cigarette of the day for as long as possible.  Start cleaning out all pockets, drawers, and your car of cigarettes.  Getting Through the Cravings Once You Are Smoke Free: Each craving will last about 10 minutes, whether or not you smoke. Here's how to get through the cravings without cigarettes:  DELAY: Tell yourself that you'll wait for the next craving. Do it every time! DEEP BREATHS: One reason smoking feels good is because you breathe in deeply to inhale. Take four slow, deep breaths and feel the relaxation without the hamful effects of cigarettes. DRINK WATER: Drink a glass of cool water. It will give your hands and mouth something to do and will help flush the nicotine  out of your system faster. DIVERT: Do something else -- brush your teeth, take a walk, call a friend who can offer you support. Just moving onto something other than thinking about cigarettes will move you through the craving.   Frequently Asked Questions  What can I do when I get the urge to smoke? To get through the urge to smoke, try the following:  Review your reasons for quitting and think of all the benefits to your health, your finances, and your family.  Remind yourself that there is no such thing as just one cigarette -- or even one puff.  Ride out the desire to smoke. Use the 4 Os -- Delay, Deep Breaths, Drink Water and  Divert to get you through. The craving will go away eventually. Do not fool yourself into thinking you can have just one cigarette.  Any tips on how to deal with stress? Stress is a natural part of life. The key is to deal with it without reaching for a cigarette. Taking deep breaths, counting backwards from 10 and asking yourself 1-how big a deal is this?"  Writing down your feelings, talking with a friend and doing things like positive self-talk and meditation are some other ways that people deal with daily stress.  What if I start smoking again? Slips happen. Most people try to quit smoking a few times before they are successful. Don't beat yourself up if this happens to you! Ask yourself if this was a slip or a relapse. A slip is a one-time mistake that is quickly corrected. A relapse is going back to your old smoking habits.   If you slip, don't give up. Think of it as a learning experience. Ask yourself what went wrong and renew your commitment to staying away from smoking for good.  If you relapse, try not to get discouraged. Ask yourself the question "What caused me to start smoking?" Figure out what helped you and what didn't when you tried to quit. Knowing why you relapsed is useful information for your next attempt to quit.  Please bring all medications to your clinic visits.  Please arrive 10-15 minutes prior to your scheduled  visit time.

## 2023-11-12 NOTE — Assessment & Plan Note (Signed)
 Stable pulm exam today. Symptoms overall seem well controlled.  -Cont Symbicort  2p BID - discussed importance of daily medication -Cont albuterol  prn  -Cont tobacco cessation management per Dr Koval
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
## 2023-11-12 NOTE — Patient Instructions (Addendum)
 Thank you for visiting clinic today and allowing us  to participate in your care!  [facility] If you or someone you know needs help, please call the [facility] at [phone]) or email [email or web address]. If you are in immediate danger, call 911.  [place] Location [address] Second Floor [place], [place] [address] Monday-Friday 8:30 a.m. to 4:30 p.m. (walk-ins and appointments)  [place] Location [address] [place], [place] [address] Monday-Friday 8:30 a.m. to 4:30 p.m. (walk-ins and appointments)  We placed a referral to Psychiatry and to social work/case management for you. Someone should contact you over the next couple of weeks. In the meantime, you may try taking Hydroxyzine  as needed for anxiety. Please see below for further therapy resources.   Please call [employer]: [phone] to receive your nebulizer equipment.   Please schedule an appointment in 1 week for close follow up.   Reach out any time with any questions or concerns you may have - we are here for you!  Damien Cassis, MD [facility] [phone]   Therapy and Counseling Resources Most providers on this list will take Medicaid. Patients with commercial insurance or Medicare should contact their insurance company to get a list of in network providers.  [facility]:Psychiatry and Counseling [address] [place], [place] [address] [phone]  [facility]  [address] [place], [place] [address]      [phone]  [facility] [address]  [place], [place] [address] [phone]  [facility] [address]  [place], [place] [address]       [phone]     [facility] (virtual only) [phone]  Janit Griff[facility] [address] [place], [place] [phone]  [facility]:  [address]  [place] [phone]  [facility]:  [address] [place] [phone]  [facility] (under & uninsured) [address]   [place] [phone]    [email or web address]    [facility] [address]  [place]    [phone]  [facility] ([facility]) -[address]     Phone:  [phone]     [facility]-  [address]  [phone]   [facility] [address]      [place], [place] [phone]  [facility]: [address] [place], [place], [place]  [phone]   [facility] (Spanish therapist) [email or web address]  [address] [place]  [address]   [place] [address]    [phone]    [facility]   [address],  [place], [place]  [phone]   [facility]  [address] [place]  [phone]  [facility]  [address] [place], [place]        [phone]  Open Access/Walk In Clinic under & uninsured  [facility]  [address] [place], [place] [phone] Crisis [phone]  [facility] [address],  (Spanish)   [address] , [place]: [phone]) 8:30 - 12; 1 - 2:30  [facility] [employer],  [address] [place], [place]    [phone]):8:30 - 12; 2 - 3PM  [facility] [employer],  [address],  [place]; [phone]):  Mon - Fri 8 AM - 5 PM  [facility] [address] [place]  MWF 12:30 to 3:00 or call to schedule an appointment  [phone]  Specific Provider options Psychology Today  [email or web address]  click on find a therapist  enter your zip code left side and select or tailor a therapist for your specific need.   [facility] Provider Directory [email or web address]  (Medicaid)    Follow all drop down to find a provider  Social Support program [facility] [place] [phone] or PhotoSolver.pl [address], [place], [place] Recovery support and educational   24- Hour Availability:   [facility]  [address] [place], [place] [phone] Crisis [phone]  [facility] [phone]  [facility]  [phone]   [facility]  [phone] (after hours)  Therapeutic Alternative/Mobile Crisis   [phone]  [place]  National Suicide Hotline  6172407117 MERRILYN)  Call 911 or go to emergency room  [facility]  [phone]);  [place] and [employer]  [phone]); [place], [place], [place], [place], [place], [place], [place]
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
## 2023-11-12 NOTE — Assessment & Plan Note (Addendum)
 Recent worsening of symptoms. No SI/HI today. Caution against starting SSRI at this time given chance of worsening mania and mood.  -Discussed Psychiatry referral -Discussed and provided therapy resources  -Discussed hydroxyzine  10 as needed while awaiting Psych -Per chart review, has historical diagnosis of schizophrenia BP1D, will update problem list

## 2023-11-14 NOTE — Progress Notes (Signed)
 Reviewed and agree with Dr Rennis plan.

## 2023-11-19 ENCOUNTER — Ambulatory Visit: Payer: MEDICAID | Admitting: Family Medicine

## 2023-11-20 ENCOUNTER — Encounter (HOSPITAL_BASED_OUTPATIENT_CLINIC_OR_DEPARTMENT_OTHER): Payer: Self-pay | Admitting: Orthopedic Surgery

## 2023-11-20 ENCOUNTER — Other Ambulatory Visit: Payer: Self-pay

## 2023-11-26 ENCOUNTER — Ambulatory Visit (HOSPITAL_BASED_OUTPATIENT_CLINIC_OR_DEPARTMENT_OTHER): Payer: MEDICAID | Admitting: Anesthesiology

## 2023-11-26 ENCOUNTER — Other Ambulatory Visit: Payer: Self-pay

## 2023-11-26 ENCOUNTER — Encounter (HOSPITAL_BASED_OUTPATIENT_CLINIC_OR_DEPARTMENT_OTHER)
Admission: RE | Disposition: A | Discharge status: Home / Self Care | Payer: Self-pay | Source: Home / Self Care | Attending: Orthopedic Surgery

## 2023-11-26 ENCOUNTER — Ambulatory Visit (HOSPITAL_BASED_OUTPATIENT_CLINIC_OR_DEPARTMENT_OTHER)
Admission: RE | Admit: 2023-11-26 | Discharge: 2023-11-26 | Disposition: A | Discharge status: Home / Self Care | Payer: MEDICAID | Attending: Orthopedic Surgery | Admitting: Orthopedic Surgery

## 2023-11-26 ENCOUNTER — Ambulatory Visit (HOSPITAL_BASED_OUTPATIENT_CLINIC_OR_DEPARTMENT_OTHER): Payer: MEDICAID

## 2023-11-26 ENCOUNTER — Encounter (HOSPITAL_BASED_OUTPATIENT_CLINIC_OR_DEPARTMENT_OTHER): Payer: Self-pay | Admitting: Orthopedic Surgery

## 2023-11-26 DIAGNOSIS — S62616A Displaced fracture of proximal phalanx of right little finger, initial encounter for closed fracture: Secondary | ICD-10-CM | POA: Diagnosis not present

## 2023-11-26 DIAGNOSIS — X58XXXA Exposure to other specified factors, initial encounter: Secondary | ICD-10-CM | POA: Insufficient documentation

## 2023-11-26 DIAGNOSIS — F17200 Nicotine dependence, unspecified, uncomplicated: Secondary | ICD-10-CM

## 2023-11-26 DIAGNOSIS — S6291XD Unspecified fracture of right wrist and hand, subsequent encounter for fracture with routine healing: Secondary | ICD-10-CM | POA: Diagnosis present

## 2023-11-26 DIAGNOSIS — F17210 Nicotine dependence, cigarettes, uncomplicated: Secondary | ICD-10-CM | POA: Diagnosis not present

## 2023-11-26 DIAGNOSIS — F419 Anxiety disorder, unspecified: Secondary | ICD-10-CM | POA: Diagnosis not present

## 2023-11-26 DIAGNOSIS — J45909 Unspecified asthma, uncomplicated: Secondary | ICD-10-CM | POA: Diagnosis not present

## 2023-11-26 DIAGNOSIS — S62616D Displaced fracture of proximal phalanx of right little finger, subsequent encounter for fracture with routine healing: Secondary | ICD-10-CM

## 2023-11-26 DIAGNOSIS — F418 Other specified anxiety disorders: Secondary | ICD-10-CM | POA: Diagnosis not present

## 2023-11-26 DIAGNOSIS — Z01818 Encounter for other preprocedural examination: Secondary | ICD-10-CM

## 2023-11-26 HISTORY — DX: Unspecified asthma, uncomplicated: J45.909

## 2023-11-26 HISTORY — PX: CLOSED REDUCTION FINGER WITH PERCUTANEOUS PINNING: SHX5612

## 2023-11-26 LAB — POCT PREGNANCY, URINE: Preg Test, Ur: NEGATIVE

## 2023-11-26 SURGERY — CLOSED REDUCTION, FINGER, WITH PERCUTANEOUS PINNING
Anesthesia: Regional | Site: Finger | Laterality: Right

## 2023-11-26 MED ORDER — FENTANYL CITRATE (PF) 100 MCG/2ML IJ SOLN
100.0000 ug | Freq: Once | INTRAMUSCULAR | Status: AC
  Administered 2023-11-26: 100 ug via INTRAVENOUS

## 2023-11-26 MED ORDER — DEXAMETHASONE SODIUM PHOSPHATE 10 MG/ML IJ SOLN
INTRAMUSCULAR | Status: DC | PRN
  Administered 2023-11-26: 10 mg via INTRAVENOUS

## 2023-11-26 MED ORDER — PROPOFOL 500 MG/50ML IV EMUL
INTRAVENOUS | Status: DC | PRN
  Administered 2023-11-26: 50 ug/kg/min via INTRAVENOUS

## 2023-11-26 MED ORDER — FENTANYL CITRATE (PF) 100 MCG/2ML IJ SOLN: 25.0000 ug | INTRAMUSCULAR | Status: DC | PRN

## 2023-11-26 MED ORDER — LIDOCAINE 2% (20 MG/ML) 5 ML SYRINGE
INTRAMUSCULAR | Status: AC
  Filled 2023-11-26: qty 10

## 2023-11-26 MED ORDER — OXYCODONE HCL 5 MG PO TABS: 5.0000 mg | ORAL_TABLET | Freq: Four times a day (QID) | ORAL | 0 refills | Status: AC | PRN

## 2023-11-26 MED ORDER — CEFAZOLIN SODIUM-DEXTROSE 2-4 GM/100ML-% IV SOLN
INTRAVENOUS | Status: AC
Start: 2023-11-26 — End: 2023-11-26
  Filled 2023-11-26: qty 100

## 2023-11-26 MED ORDER — CLONIDINE HCL (ANALGESIA) 100 MCG/ML EP SOLN
EPIDURAL | Status: DC | PRN
  Administered 2023-11-26: 50 ug

## 2023-11-26 MED ORDER — ONDANSETRON HCL 4 MG/2ML IJ SOLN
INTRAMUSCULAR | Status: DC | PRN
  Administered 2023-11-26: 4 mg via INTRAVENOUS

## 2023-11-26 MED ORDER — EPHEDRINE 5 MG/ML INJ
INTRAVENOUS | Status: AC
  Filled 2023-11-26: qty 10

## 2023-11-26 MED ORDER — MIDAZOLAM HCL 2 MG/2ML IJ SOLN
2.0000 mg | Freq: Once | INTRAMUSCULAR | Status: AC
  Administered 2023-11-26: 2 mg via INTRAVENOUS

## 2023-11-26 MED ORDER — ARTIFICIAL TEARS OPHTHALMIC OINT
TOPICAL_OINTMENT | OPHTHALMIC | Status: AC
  Filled 2023-11-26: qty 17.5

## 2023-11-26 MED ORDER — PHENYLEPHRINE 80 MCG/ML (10ML) SYRINGE FOR IV PUSH (FOR BLOOD PRESSURE SUPPORT)
PREFILLED_SYRINGE | INTRAVENOUS | Status: AC
  Filled 2023-11-26: qty 50

## 2023-11-26 MED ORDER — ROPIVACAINE HCL 5 MG/ML IJ SOLN
INTRAMUSCULAR | Status: DC | PRN
  Administered 2023-11-26: 30 mL via PERINEURAL

## 2023-11-26 MED ORDER — DEXAMETHASONE SODIUM PHOSPHATE 10 MG/ML IJ SOLN
INTRAMUSCULAR | Status: AC
  Filled 2023-11-26: qty 2

## 2023-11-26 MED ORDER — LACTATED RINGERS IV SOLN: INTRAVENOUS | Status: DC

## 2023-11-26 MED ORDER — LIDOCAINE 2% (20 MG/ML) 5 ML SYRINGE
INTRAMUSCULAR | Status: DC | PRN
  Administered 2023-11-26: 100 mg via INTRAVENOUS

## 2023-11-26 MED ORDER — AMISULPRIDE (ANTIEMETIC) 5 MG/2ML IV SOLN: 10.0000 mg | Freq: Once | INTRAVENOUS | Status: DC | PRN

## 2023-11-26 MED ORDER — ONDANSETRON HCL 4 MG/2ML IJ SOLN: 4.0000 mg | Freq: Once | INTRAMUSCULAR | Status: DC | PRN

## 2023-11-26 MED ORDER — ONDANSETRON HCL 4 MG/2ML IJ SOLN
INTRAMUSCULAR | Status: AC
  Filled 2023-11-26: qty 2

## 2023-11-26 MED ORDER — ACETAMINOPHEN 500 MG PO TABS
ORAL_TABLET | ORAL | Status: AC
  Filled 2023-11-26: qty 2

## 2023-11-26 MED ORDER — CEFAZOLIN SODIUM-DEXTROSE 2-3 GM-%(50ML) IV SOLR
INTRAVENOUS | Status: DC | PRN
  Administered 2023-11-26: 2 g via INTRAVENOUS

## 2023-11-26 MED ORDER — FENTANYL CITRATE (PF) 100 MCG/2ML IJ SOLN
INTRAMUSCULAR | Status: AC
Start: 2023-11-26 — End: 2023-11-26
  Filled 2023-11-26: qty 2

## 2023-11-26 MED ORDER — SODIUM CHLORIDE (PF) 0.9 % IJ SOLN
INTRAMUSCULAR | Status: AC
  Filled 2023-11-26: qty 30

## 2023-11-26 MED ORDER — MIDAZOLAM HCL 2 MG/2ML IJ SOLN
INTRAMUSCULAR | Status: AC
  Filled 2023-11-26: qty 2

## 2023-11-26 MED ORDER — KETOROLAC TROMETHAMINE 30 MG/ML IJ SOLN
INTRAMUSCULAR | Status: AC
  Filled 2023-11-26: qty 3

## 2023-11-26 MED ORDER — DEXMEDETOMIDINE HCL IN NACL 80 MCG/20ML IV SOLN
INTRAVENOUS | Status: DC | PRN
  Administered 2023-11-26 (×2): 4 ug via INTRAVENOUS

## 2023-11-26 MED ORDER — ACETAMINOPHEN 500 MG PO TABS
1000.0000 mg | ORAL_TABLET | Freq: Once | ORAL | Status: AC
  Administered 2023-11-26: 1000 mg via ORAL

## 2023-11-26 MED ORDER — CEFAZOLIN SODIUM-DEXTROSE 2-4 GM/100ML-% IV SOLN: 2.0000 g | INTRAVENOUS | Status: DC

## 2023-11-26 SURGICAL SUPPLY — 35 items
BLADE SURG 15 STRL LF DISP TIS (BLADE) IMPLANT
BNDG COHESIVE 1X5 TAN STRL LF (GAUZE/BANDAGES/DRESSINGS) IMPLANT
BNDG COMPR ESMARK 4X3 LF (GAUZE/BANDAGES/DRESSINGS) IMPLANT
BNDG ELASTIC 3INX 5YD STR LF (GAUZE/BANDAGES/DRESSINGS) ×1 IMPLANT
BNDG GAUZE DERMACEA FLUFF 4 (GAUZE/BANDAGES/DRESSINGS) ×1 IMPLANT
CHLORAPREP W/TINT 26 (MISCELLANEOUS) ×1 IMPLANT
CORD BIPOLAR FORCEPS 12FT (ELECTRODE) IMPLANT
COVER BACK TABLE 60X90IN (DRAPES) ×1 IMPLANT
COVER MAYO STAND STRL (DRAPES) ×1 IMPLANT
CUFF TOURN SGL QUICK 18X4 (TOURNIQUET CUFF) IMPLANT
CUFF TRNQT CYL 24X4X16.5-23 (TOURNIQUET CUFF) IMPLANT
DRAPE EXTREMITY T 121X128X90 (DISPOSABLE) ×1 IMPLANT
DRAPE OEC MINIVIEW 54X84 (DRAPES) ×1 IMPLANT
DRAPE SURG 17X23 STRL (DRAPES) ×1 IMPLANT
GAUZE SPONGE 4X4 12PLY STRL (GAUZE/BANDAGES/DRESSINGS) IMPLANT
GAUZE XEROFORM 1X8 LF (GAUZE/BANDAGES/DRESSINGS) ×1 IMPLANT
GLOVE BIO SURGEON STRL SZ7 (GLOVE) ×1 IMPLANT
GOWN STRL REUS W/ TWL LRG LVL3 (GOWN DISPOSABLE) ×2 IMPLANT
GOWN STRL REUS W/ TWL XL LVL3 (GOWN DISPOSABLE) IMPLANT
NDL HYPO 25X1 1.5 SAFETY (NEEDLE) IMPLANT
NEEDLE HYPO 25X1 1.5 SAFETY (NEEDLE) IMPLANT
NS IRRIG 1000ML POUR BTL (IV SOLUTION) ×1 IMPLANT
PACK BASIN DAY SURGERY FS (CUSTOM PROCEDURE TRAY) ×1 IMPLANT
PAD CAST 3X4 CTTN HI CHSV (CAST SUPPLIES) IMPLANT
SLEEVE SCD COMPRESS KNEE MED (STOCKING) IMPLANT
SPLINT FIBERGLASS 4X30 (CAST SUPPLIES) IMPLANT
SPLINT PLASTER CAST XFAST 4X15 (CAST SUPPLIES) ×10 IMPLANT
SUCTION TUBE FRAZIER 10FR DISP (SUCTIONS) ×1 IMPLANT
SUT ETHILON 4 0 PS 2 18 (SUTURE) ×1 IMPLANT
SUT MNCRL AB 3-0 PS2 18 (SUTURE) ×1 IMPLANT
SUT VIC AB 4-0 PS2 18 (SUTURE) IMPLANT
SYR BULB EAR ULCER 3OZ GRN STR (SYRINGE) IMPLANT
SYR CONTROL 10ML LL (SYRINGE) IMPLANT
TOWEL GREEN STERILE FF (TOWEL DISPOSABLE) ×2 IMPLANT
UNDERPAD 30X36 HEAVY ABSORB (UNDERPADS AND DIAPERS) ×1 IMPLANT

## 2023-11-26 NOTE — H&P (Signed)
 HAND SURGERY   HPI: Patient is a 38 y.o. female who presents with a closed fracture of the right small finger proximal phalanx.  The angulation and questionable malrotation is unacceptable to the patient.  Patient denies any changes to their medical history or new systemic symptoms today.    Past Medical History:  Diagnosis Date   Alcohol  use disorder, severe, dependence (HCC)    Anxiety    Asthma    Cocaine use    Depression    Psychosis, unspecified psychosis type (HCC)    Past Surgical History:  Procedure Laterality Date   CESAREAN SECTION     FRACTURE SURGERY     Social History   Socioeconomic History   Marital status: Married    Spouse name: Not on file   Number of children: Not on file   Years of education: Not on file   Highest education level: 11th grade  Occupational History   Not on file  Tobacco Use   Smoking status: Every Day    Current packs/day: 2.00    Average packs/day: 1.7 packs/day for 29.7 years (50.3 ttl pk-yrs)    Types: Cigarettes    Start date: 1996    Passive exposure: Current   Smokeless tobacco: Never   Tobacco comments:    Has been in rehab for substance abuse in the past  Vaping Use   Vaping status: Never Used  Substance and Sexual Activity   Alcohol  use: Yes    Alcohol /week: 4.0 standard drinks of alcohol     Types: 3 Cans of beer, 1 Shots of liquor per week    Comment: 1 1/2 forty   Drug use: Yes    Types: Crack cocaine    Comment: pt reports she last used 2 days ago (11/20/23)   Sexual activity: Yes    Partners: Male    Birth control/protection: Injection, Condom  Other Topics Concern   Not on file  Social History Narrative   Not on file   Social Drivers of Health   Financial Resource Strain: High Risk (11/21/2022)   Overall Financial Resource Strain (CARDIA)    Difficulty of Paying Living Expenses: Hard  Food Insecurity: Food Insecurity Present (11/12/2023)   Hunger Vital Sign    Worried About Running Out of Food in  the Last Year: Often true    Ran Out of Food in the Last Year: Often true  Transportation Needs: Unmet Transportation Needs (11/21/2022)   PRAPARE - Transportation    Lack of Transportation (Medical): Yes    Lack of Transportation (Non-Medical): Yes  Physical Activity: Insufficiently Active (11/21/2022)   Exercise Vital Sign    Days of Exercise per Week: 4 days    Minutes of Exercise per Session: 30 min  Stress: Stress Concern Present (11/21/2022)   [employer] of Occupational Health - Occupational Stress Questionnaire    Feeling of Stress : Very much  Social Connections: Moderately Integrated (11/21/2022)   Social Connection and Isolation Panel    Frequency of Communication with Friends and Family: More than three times a week    Frequency of Social Gatherings with Friends and Family: Once a week    Attends Religious Services: More than 4 times per year    Active Member of [employer] or Organizations: No    Attends Engineer, structural: Not on file    Marital Status: Married   Family History  Problem Relation Age of Onset   Alcohol  abuse Mother    Bipolar disorder Mother  Depression Mother    Alcohol  abuse Father    Hyperlipidemia Father    Hypertension Father    Alcohol  abuse Maternal Grandmother    Bipolar disorder Maternal Grandmother    Alcohol  abuse Maternal Grandfather    - negative except otherwise stated in the family history section Allergies  Allergen Reactions   Peanut (Diagnostic) Anaphylaxis and Hives   Phenergan [Promethazine] Anaphylaxis and Nausea And Vomiting   Pineapple Swelling   Thorazine [Chlorpromazine] Anaphylaxis   Bee Venom Hives   Prior to Admission medications   Medication Sig Start Date End Date Taking? Authorizing Provider  albuterol  (VENTOLIN  HFA) 108 (90 Base) MCG/ACT inhaler Inhale 1-2 puffs into the lungs every 6 (six) hours as needed for wheezing or shortness of breath. 10/09/23  Yes Diona Perkins, MD  acetaminophen  (TYLENOL ) 500  MG tablet Take 1,000 mg by mouth every 6 (six) hours as needed.    [provider]  albuterol  (PROVENTIL ) (2.5 MG/3ML) 0.083% nebulizer solution Take 3 mLs (2.5 mg total) by nebulization every 6 (six) hours as needed for wheezing or shortness of breath. Patient not taking: Reported on 11/12/2023 10/09/23   Diona Perkins, MD  budesonide -formoterol  (SYMBICORT ) 80-4.5 MCG/ACT inhaler Inhale 2 puffs into the lungs 2 (two) times daily. Patient taking differently: Inhale 2 puffs into the lungs 2 (two) times daily. Admits to forgetting to take at times 10/09/23   McDiarmid, Krystal BIRCH, MD  EPINEPHrine  0.3 mg/0.3 mL IJ SOAJ injection Inject 0.3 mg into the muscle as needed for anaphylaxis. 10/09/23   Diona Perkins, MD  fluticasone  (FLONASE ) 50 MCG/ACT nasal spray Place 2 sprays into both nostrils 2 (two) times daily as needed for allergies or rhinitis. 10/09/23   McDiarmid, Krystal BIRCH, MD  hydrOXYzine  (ATARAX ) 10 MG tablet Take 1 tablet (10 mg total) by mouth 3 (three) times daily as needed. 11/12/23   Diona Perkins, MD  naproxen  (NAPROSYN ) 500 MG tablet Take 1 tablet (500 mg total) by mouth 2 (two) times daily. 10/13/23   Cleotilde Lukes, DO  ondansetron  (ZOFRAN -ODT) 4 MG disintegrating tablet Take 1 tablet (4 mg total) by mouth every 8 (eight) hours as needed for nausea or vomiting. 11/12/23   McDiarmid, Krystal BIRCH, MD  oxyCODONE  (ROXICODONE ) 5 MG immediate release tablet Take 1 tablet (5 mg total) by mouth every 4 (four) hours as needed for severe pain (pain score 7-10). 11/04/23   Hildegard Loge, PA-C  Prenatal Vit-Fe Fumarate-FA (PRENATAL VITAMIN) 27-0.8 MG TABS Take 1 tablet by mouth daily. Patient taking differently: Take 1 tablet by mouth daily. Admits to taking on and off 10/09/23   Diona Perkins, MD  varenicline  (CHANTIX ) 0.5 MG tablet Take 1 tablet (0.5 mg total) by mouth 2 (two) times daily with a meal. As directed 10/09/23   McDiarmid, Krystal BIRCH, MD   No results found. - Positive ROS: All other systems have been reviewed  and were otherwise negative with the exception of those mentioned in the HPI and as above.  Physical Exam: General: No acute distress, resting comfortably Cardiovascular: BUE warm and well perfused, normal rate Respiratory: Normal WOB on RA Skin: Warm and dry Neurologic: Sensation intact distally Psychiatric: Patient is at baseline mood and affect  Right Upper Extremity  Ulnar gutter splint soiled but dry.  Full AROM of remaining fingers.  SILT throughout hand.  Fingers pink and well perfused.    Assessment: 38 yo F w/ closed, right small finger proximal phalanx fracture.   Plan: OR today for closed versus open reduction  and percutaneous pinning. We again reviewed the risks of surgery which include bleeding, infection, damage to neurovascular structures, persistent symptoms, nonunion, malunion, stiffness, need for additional surgery.  Informed consent was signed.  All questions were answered.   Bebe Galla, M.D. [facility] 7:32 AM

## 2023-11-26 NOTE — Anesthesia Postprocedure Evaluation (Signed)
 Anesthesia Post Note  Patient: Darlene Gates  Procedure(s) Performed: CLOSED REDUCTION, FINGER, WITH PERCUTANEOUS PINNING (Right: Finger)     Patient location during evaluation: PACU Anesthesia Type: Regional Level of consciousness: awake and alert Pain management: pain level controlled Vital Signs Assessment: post-procedure vital signs reviewed and stable Respiratory status: spontaneous breathing, nonlabored ventilation and respiratory function stable Cardiovascular status: stable and blood pressure returned to baseline Postop Assessment: no apparent nausea or vomiting Anesthetic complications: no   No notable events documented.  Last Vitals:  Vitals:   11/26/23 1000 11/26/23 1025  BP: (!) 116/95 (!) 117/95  Pulse: 67 67  Resp: 11 16  Temp:  36.6 C  SpO2: 99% 99%    Last Pain:  Vitals:   11/26/23 1025  TempSrc: Temporal  PainSc: 0-No pain                 Garnette FORBES Skillern

## 2023-11-26 NOTE — Interval H&P Note (Signed)
 History and Physical Interval Note:  11/26/2023 7:34 AM  Darlene Gates  has presented today for surgery, with the diagnosis of Right small finger proximal phalanx fracture.  The various methods of treatment have been discussed with the patient and family. After consideration of risks, benefits and other options for treatment, the patient has consented to  Procedure(s) with comments: CLOSED REDUCTION, FINGER, WITH PERCUTANEOUS PINNING (Right) - Closed versus open reduction and percutaneous pinning of right small finger proximal phalanx fracture as a surgical intervention.  The patient's history has been reviewed, patient examined, no change in status, stable for surgery.  I have reviewed the patient's chart and labs.  Questions were answered to the patient's satisfaction.     Bettey Muraoka

## 2023-11-26 NOTE — Anesthesia Preprocedure Evaluation (Signed)
 Anesthesia Evaluation  Patient identified by MRN, date of birth, ID band Patient awake    Reviewed: Allergy & Precautions, NPO status , Patient's Chart, lab work & pertinent test results  Airway Mallampati: II  TM Distance: >3 FB Neck ROM: Full    Dental  (+) Dental Advisory Given, Poor Dentition   Pulmonary asthma , Current Smoker   Pulmonary exam normal breath sounds clear to auscultation       Cardiovascular negative cardio ROS Normal cardiovascular exam Rhythm:Regular Rate:Normal     Neuro/Psych  PSYCHIATRIC DISORDERS Anxiety Depression Bipolar Disorder Schizophrenia  negative neurological ROS     GI/Hepatic negative GI ROS,,,(+)     substance abuse  alcohol  use and cocaine use  Endo/Other  negative endocrine ROS    Renal/GU negative Renal ROS     Musculoskeletal Closed right hand fracture   Abdominal   Peds  Hematology negative hematology ROS (+)   Anesthesia Other Findings Day of surgery medications reviewed with the patient.  Reproductive/Obstetrics negative OB ROS                              Anesthesia Physical Anesthesia Plan  ASA: 3  Anesthesia Plan: Regional   Post-op Pain Management: Tylenol  PO (pre-op)*   Induction: Intravenous  PONV Risk Score and Plan: 1 and TIVA, Treatment may vary due to age or medical condition, Midazolam , Dexamethasone  and Ondansetron   Airway Management Planned: Simple Face Mask and Natural Airway  Additional Equipment:   Intra-op Plan:   Post-operative Plan:   Informed Consent: I have reviewed the patients History and Physical, chart, labs and discussed the procedure including the risks, benefits and alternatives for the proposed anesthesia with the patient or authorized representative who has indicated his/her understanding and acceptance.     Dental advisory given  Plan Discussed with: CRNA  Anesthesia Plan Comments:          Anesthesia Quick Evaluation

## 2023-11-26 NOTE — Op Note (Signed)
   Date of Surgery: 11/26/2023  INDICATIONS: Patient is a 38 y.o.-year-old female with a closed, right small finger proximal phalanx fracture.  We reviewed the nature of her injury in the office with the patient electing for surgical management rather than conservative treatment.  Risks, benefits, and alternatives to surgery were again discussed with the patient in the preoperative area. The patient wishes to proceed with surgery.  Informed consent was signed after our discussion.   PREOPERATIVE DIAGNOSIS:  Right small finger proximal phalanx fracture  POSTOPERATIVE DIAGNOSIS: Same.  PROCEDURE:  Closed reduction and percutaneous pinning of right small finger proximal phalanx   SURGEON: Carlin Galla, M.D.  ASSIST: None  ANESTHESIA:  Regional, MAC  IV FLUIDS AND URINE: See anesthesia.  ESTIMATED BLOOD LOSS: 5 mL.  IMPLANTS: * No implants in log *   DRAINS: None  COMPLICATIONS: None noted  DESCRIPTION OF PROCEDURE: The patient was met in the preoperative holding area where the surgical site was marked and the consent form was signed.  The patient was then taken to the operating room and remained on the stretcher.  A hand table was placed adjacent to the right upper extremity.  All bony prominences were well padded.  A tourniquet was applied to the right upper extremity.  Monitored sedation was induced.  The operative extremity was prepped and draped in the usual and sterile fashion.  A formal time-out was performed to confirm that this was the correct patient, surgery, side, and site.   Following formal timeout, the mini fluoroscopy machine was prepped and draped and brought onto the field.  A closed reduction was performed.  A 0.045 inch K wire was then driven in antegrade fashion starting at the proximal and radial aspect of the proximal phalangeal base.  The wire was driven across the fracture site into the distal aspect of the proximal phalanx and seated within the subchondral  bone of the head of the proximal phalanx.  AP and lateral views showed much improved appearance of the fracture.  There did not appear to be any clinical malrotation of the finger.  A second 0.045 inch K wire was then driven in an antegrade fashion starting at the ulnar base of the proximal phalanx.  This was similarly driven into the distal aspect of the proximal phalanx within the subchondral bone.  AP and lateral view showed that the fracture was much better reduced and the K wires were in appropriate position.  Clinical exam of the hand showed that the finger did not appear to be malrotated.  The K wires were then bent and cut.  The wires were then dressed with Xeroform and folded 4 x 4's.  The hand was then dressed with folded Kerlix, cast padding, and a well-padded ulnar gutter splint was applied.  The patient was reversed from sedation.  All counts were correct x 2 at the end of the procedure.  The patient was then taken to the PACU in stable condition.   POSTOPERATIVE PLAN: She will be discharged to home with appropriate pain medication and discharge instructions.  I will see her back in 2 weeks or so for her first postop visit.  We will get repeat x-rays of the hand out of the splint.  Carlin Galla, MD 9:54 AM

## 2023-11-26 NOTE — Transfer of Care (Signed)
 Immediate Anesthesia Transfer of Care Note  Patient: Darlene Gates [facility]  Procedure(s) Performed: CLOSED REDUCTION, FINGER, WITH PERCUTANEOUS PINNING (Right: Finger)  Patient Location: PACU  Anesthesia Type:General  Level of Consciousness: awake  Airway & Oxygen Therapy: Patient Spontanous Breathing and Patient connected to face mask oxygen  Post-op Assessment: Report given to RN and Post -op Vital signs reviewed and stable  Post vital signs: Reviewed and stable  Last Vitals:  Vitals Value Taken Time  BP 105/83 11/26/23 09:38  Temp    Pulse 67 11/26/23 09:40  Resp 17 11/26/23 09:40  SpO2 100 % 11/26/23 09:40  Vitals shown include unfiled device data.  Last Pain:  Vitals:   11/26/23 0712  TempSrc: Temporal  PainSc: 0-No pain         Complications: No notable events documented.

## 2023-11-26 NOTE — Anesthesia Procedure Notes (Signed)
 Anesthesia Regional Block: Axillary brachial plexus block   Pre-Anesthetic Checklist: , timeout performed,  Correct Patient, Correct Site, Correct Laterality,  Correct Procedure, Correct Position, site marked,  Risks and benefits discussed,  Surgical consent,  Pre-op evaluation,  At surgeon's request and post-op pain management  Laterality: Right  Prep: chloraprep       Needles:  Injection technique: Single-shot  Needle Type: Echogenic Needle     Needle Length: 9cm  Needle Gauge: 21     Additional Needles:   Procedures:,,,, ultrasound used (permanent image in chart),,    Narrative:  Start time: 11/26/2023 7:49 AM End time: 11/26/2023 7:56 AM Injection made incrementally with aspirations every 5 mL.  Performed by: Personally  Anesthesiologist: Corinne Garnette BRAVO, MD  Additional Notes: No pain on injection. No increased resistance to injection. Injection made in 5cc increments.  Good needle visualization.  Patient tolerated procedure well.

## 2023-11-26 NOTE — Progress Notes (Signed)
Assisted Dr. Desmond Lope with right, axillary, ultrasound guided block. Side rails up, monitors on throughout procedure. See vital signs in flow sheet. Tolerated Procedure well.

## 2023-11-26 NOTE — Discharge Instructions (Addendum)
 Carlin Galla, M.D. Hand Surgery  POST-OPERATIVE DISCHARGE INSTRUCTIONS   PRESCRIPTIONS: You may have been given a prescription to be taken as directed for post-operative pain control.  You may also take over the counter ibuprofen /aleve  and tylenol  for pain. Take this as directed on the packaging. Do not exceed 3000 mg tylenol /acetaminophen  in 24 hours.  Ibuprofen  600-800 mg (3-4) tablets by mouth every 6 hours as needed for pain.  OR Aleve  2 tablets by mouth every 12 hours (twice daily) as needed for pain.  AND/OR Tylenol  1000 mg (2 tablets) every 8 hours as needed for pain.  Please use your pain medication carefully, as refills are limited and you may not be provided with one.  As stated above, please use over the counter pain medicine - it will also be helpful with decreasing your swelling.    ANESTHESIA: After your surgery, post-surgical discomfort or pain is likely. This discomfort can last several days to a few weeks. At certain times of the day your discomfort may be more intense.   Did you receive a nerve block?  A nerve block can provide pain relief for one hour to two days after your surgery. As long as the nerve block is working, you will experience little or no sensation in the area the surgeon operated on.  As the nerve block wears off, you will begin to experience pain or discomfort. It is very important that you begin taking your prescribed pain medication before the nerve block fully wears off. Treating your pain at the first sign of the block wearing off will ensure your pain is better controlled and more tolerable when full-sensation returns. Do not wait until the pain is intolerable, as the medicine will be less effective. It is better to treat pain in advance than to try and catch up.   General Anesthesia:  If you did not receive a nerve block during your surgery, you will need to start taking your pain medication shortly after your surgery and should continue  to do so as prescribed by your surgeon.     ICE AND ELEVATION: You may use ice for the first 48-72 hours, but it is not critical.   Motion of your fingers is very important to decrease the swelling.  Elevation, as much as possible for the next 48 hours, is critical for decreasing swelling as well as for pain relief. Elevation means when you are seated or lying down, you hand should be at or above your heart. When walking, the hand needs to be at or above the level of your elbow.  If the bandage gets too tight, it may need to be loosened. Please contact our office and we will instruct you in how to do this.    SURGICAL BANDAGES:  Keep your dressing and/or splint clean and dry at all times.  Do not remove until you are seen again in the office.  If careful, you may place a plastic bag over your bandage and tape the end to shower, but be careful, do not get your bandages wet.     HAND THERAPY:  You may not need any. If you do, they will call you to set up the first visit.    ACTIVITY AND WORK: You are encouraged to move any fingers which are not in the bandage.  Light use of the fingers is allowed to assist the other hand with daily hygiene and eating, but strong gripping or lifting is often uncomfortable and should be  avoided.  You might miss a variable period of time from work and hopefully this issue has been discussed prior to surgery. You may not do any heavy work with your affected hand for about 2 weeks.    [facility] Second Floor, [address] [place], [place] [address] [phone]      Post Anesthesia Home Care Instructions  Activity: Get plenty of rest for the remainder of the day. A responsible individual must stay with you for 24 hours following the procedure.  For the next 24 hours, DO NOT: -Drive a car -Advertising copywriter -Drink alcoholic beverages -Take any medication unless instructed by your physician -Make any legal decisions or sign important  papers.  Meals: Start with liquid foods such as gelatin or soup. Progress to regular foods as tolerated. Avoid greasy, spicy, heavy foods. If nausea and/or vomiting occur, drink only clear liquids until the nausea and/or vomiting subsides. Call your physician if vomiting continues.  Special Instructions/Symptoms: Your throat may feel dry or sore from the anesthesia or the breathing tube placed in your throat during surgery. If this causes discomfort, gargle with warm salt water. The discomfort should disappear within 24 hours.   May have Tylenol  again after 1:30  Regional Anesthesia Blocks  1. You may not be able to move or feel the blocked extremity after a regional anesthetic block. This may last may last from 3-48 hours after placement, but it will go away. The length of time depends on the medication injected and your individual response to the medication. As the nerves start to wake up, you may experience tingling as the movement and feeling returns to your extremity. If the numbness and inability to move your extremity has not gone away after 48 hours, please call your surgeon.   2. The extremity that is blocked will need to be protected until the numbness is gone and the strength has returned. Because you cannot feel it, you will need to take extra care to avoid injury. Because it may be weak, you may have difficulty moving it or using it. You may not know what position it is in without looking at it while the block is in effect.  3. For blocks in the legs and feet, returning to weight bearing and walking needs to be done carefully. You will need to wait until the numbness is entirely gone and the strength has returned. You should be able to move your leg and foot normally before you try and bear weight or walk. You will need someone to be with you when you first try to ensure you do not fall and possibly risk injury.  4. Bruising and tenderness at the needle site are common side effects  and will resolve in a few days.  5. Persistent numbness or new problems with movement should be communicated to the surgeon or the [facility] [phone] [facility] [phone]).

## 2023-11-27 ENCOUNTER — Encounter (HOSPITAL_BASED_OUTPATIENT_CLINIC_OR_DEPARTMENT_OTHER): Payer: Self-pay | Admitting: Orthopedic Surgery

## 2023-12-05 ENCOUNTER — Ambulatory Visit: Payer: Self-pay | Admitting: Family Medicine

## 2023-12-05 ENCOUNTER — Encounter: Payer: Self-pay | Admitting: Family Medicine

## 2023-12-05 ENCOUNTER — Ambulatory Visit: Payer: MEDICAID | Admitting: Family Medicine

## 2023-12-05 VITALS — BP 133/98 | HR 95 | Ht 61.0 in | Wt 125.5 lb

## 2023-12-05 DIAGNOSIS — F411 Generalized anxiety disorder: Secondary | ICD-10-CM

## 2023-12-05 DIAGNOSIS — Z3009 Encounter for other general counseling and advice on contraception: Secondary | ICD-10-CM

## 2023-12-05 DIAGNOSIS — Z3200 Encounter for pregnancy test, result unknown: Secondary | ICD-10-CM

## 2023-12-05 DIAGNOSIS — Z23 Encounter for immunization: Secondary | ICD-10-CM

## 2023-12-05 DIAGNOSIS — F4310 Post-traumatic stress disorder, unspecified: Secondary | ICD-10-CM

## 2023-12-05 DIAGNOSIS — F203 Undifferentiated schizophrenia: Secondary | ICD-10-CM

## 2023-12-05 DIAGNOSIS — F319 Bipolar disorder, unspecified: Secondary | ICD-10-CM

## 2023-12-05 LAB — POCT URINE PREGNANCY: Preg Test, Ur: NEGATIVE

## 2023-12-05 NOTE — Patient Instructions (Addendum)
 Thank you for visiting clinic today and allowing us  to participate in your care!  [facility] If you or someone you know needs help, please call the [facility] at 336-641-SAFE [phone]) or email FJCinfo@[place] .gov. If you are in immediate danger, call 911.   [place] Location [address] Second Floor [place], [place] [address] Monday-Friday 8:30 a.m. to 4:30 p.m. (walk-ins and appointments)   [place] Location [address] [place], [place] [address] Monday-Friday 8:30 a.m. to 4:30 p.m. (walk-ins and appointments)   Someone should reach out in the next couple of weeks about scheduling a psychiatry appointment.   Please see the attached to review birth control methods.   Please schedule an appointment in 2-3 weeks for follow up.   Reach out any time with any questions or concerns you may have - we are here for you!  Damien Cassis, MD [facility] [phone]
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
## 2023-12-05 NOTE — Assessment & Plan Note (Signed)
 No SI/HI at this time. Previously placed psychiatry referral still pending.

## 2023-12-12 ENCOUNTER — Other Ambulatory Visit: Payer: Self-pay

## 2023-12-12 ENCOUNTER — Encounter (HOSPITAL_COMMUNITY): Payer: Self-pay

## 2023-12-12 ENCOUNTER — Emergency Department (HOSPITAL_COMMUNITY)
Admission: EM | Admit: 2023-12-12 | Discharge: 2023-12-13 | Discharge status: Against Medical Advice | Payer: MEDICAID | Attending: Emergency Medicine | Admitting: Emergency Medicine

## 2023-12-12 DIAGNOSIS — H5789 Other specified disorders of eye and adnexa: Secondary | ICD-10-CM | POA: Insufficient documentation

## 2023-12-12 DIAGNOSIS — Z5321 Procedure and treatment not carried out due to patient leaving prior to being seen by health care provider: Secondary | ICD-10-CM | POA: Diagnosis not present

## 2023-12-12 MED ORDER — TETRACAINE HCL 0.5 % OP SOLN
2.0000 [drp] | Freq: Once | OPHTHALMIC | Status: DC
Start: 2023-12-12 — End: 2023-12-13

## 2023-12-12 MED ORDER — FLUORESCEIN SODIUM 1 MG OP STRP: 1.0000 | ORAL_STRIP | Freq: Once | OPHTHALMIC | Status: DC
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
## 2023-12-12 NOTE — ED Triage Notes (Signed)
 Patient c/o eye swelling and drainage x 3 days, patient denies injury, foreign body, or trauma. Patient endorses fevers but unable to state how high.

## 2023-12-13 ENCOUNTER — Other Ambulatory Visit: Payer: Self-pay

## 2023-12-13 ENCOUNTER — Encounter (HOSPITAL_COMMUNITY): Payer: Self-pay | Admitting: *Deleted

## 2023-12-13 ENCOUNTER — Ambulatory Visit (HOSPITAL_COMMUNITY)
Admission: EM | Admit: 2023-12-13 | Discharge: 2023-12-13 | Disposition: A | Discharge status: Home / Self Care | Payer: MEDICAID | Attending: Family Medicine | Admitting: Family Medicine

## 2023-12-13 DIAGNOSIS — H1031 Unspecified acute conjunctivitis, right eye: Secondary | ICD-10-CM | POA: Insufficient documentation

## 2023-12-13 DIAGNOSIS — N926 Irregular menstruation, unspecified: Secondary | ICD-10-CM | POA: Diagnosis present

## 2023-12-13 DIAGNOSIS — N760 Acute vaginitis: Secondary | ICD-10-CM | POA: Insufficient documentation

## 2023-12-13 DIAGNOSIS — L03211 Cellulitis of face: Secondary | ICD-10-CM | POA: Diagnosis present

## 2023-12-13 LAB — POCT URINALYSIS DIP (MANUAL ENTRY)
Bilirubin, UA: NEGATIVE
Glucose, UA: NEGATIVE mg/dL
Leukocytes, UA: NEGATIVE
Nitrite, UA: NEGATIVE
Spec Grav, UA: 1.025 (ref 1.010–1.025)
Urobilinogen, UA: 0.2 U/dL
pH, UA: 5.5 (ref 5.0–8.0)

## 2023-12-13 LAB — POCT URINE PREGNANCY: Preg Test, Ur: NEGATIVE

## 2023-12-13 MED ORDER — GENTAMICIN SULFATE 0.3 % OP SOLN: 2.0000 [drp] | Freq: Three times a day (TID) | OPHTHALMIC | 0 refills | Status: AC

## 2023-12-13 MED ORDER — IBUPROFEN 600 MG PO TABS: 600.0000 mg | ORAL_TABLET | Freq: Three times a day (TID) | ORAL | 0 refills | Status: AC | PRN

## 2023-12-13 MED ORDER — AMOXICILLIN-POT CLAVULANATE 875-125 MG PO TABS: 1.0000 | ORAL_TABLET | Freq: Two times a day (BID) | ORAL | 0 refills | Status: AC

## 2023-12-13 NOTE — Discharge Instructions (Addendum)
 Urinalysis did not show any signs of infection in the bladder.  The pregnancy test was negative.  Take amoxicillin -clavulanate 875 mg--1 tab twice daily with food for 7 days  Take ibuprofen  600 mg--1 tab every 8 hours as needed for pain.  Put gentamicin  eyedrops in the affected eye(s) 3 times daily for 5 days.  I think the black spot on your palm is probably actually a bruise.  Staff will notify you if there is anything positive on the swab. It can take 2-3 days for the tests to result, depending on the day of the week your test was taken. You will only be notified if there are any positives on the testing; test results will also go to your MyChart if you are signed up for MyChart.   If your arm is hurting you that much, it may be reasonable to go to the orthopedic urgent care that [facility] has today.  They are open till 3 PM on Saturdays

## 2023-12-13 NOTE — ED Triage Notes (Signed)
 PT just now reported she has ABD pain for over 2 weeks.
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
## 2023-12-13 NOTE — ED Notes (Signed)
 Pt name called for updated vitals, no response

## 2023-12-13 NOTE — ED Triage Notes (Signed)
 PT reports Rt facial swelling for three days. Pt has a black spot on palm of Lt hand and wants it looked at. Pt presents with a cast on Rt forearm and reports pain to arm. Pt just now reported she has a vaginal discharge.

## 2023-12-15 LAB — CERVICOVAGINAL ANCILLARY ONLY
Bacterial Vaginitis (gardnerella): POSITIVE — AB
Candida Glabrata: NEGATIVE
Candida Vaginitis: NEGATIVE
Chlamydia: NEGATIVE
Comment: NEGATIVE
Comment: NEGATIVE
Comment: NEGATIVE
Comment: NEGATIVE
Comment: NEGATIVE
Comment: NORMAL
Neisseria Gonorrhea: NEGATIVE
Trichomonas: NEGATIVE

## 2023-12-16 ENCOUNTER — Ambulatory Visit: Payer: MEDICAID | Admitting: Pharmacist

## 2023-12-17 ENCOUNTER — Ambulatory Visit (HOSPITAL_COMMUNITY): Payer: Self-pay

## 2023-12-17 MED ORDER — METRONIDAZOLE 500 MG PO TABS: 500.0000 mg | ORAL_TABLET | Freq: Two times a day (BID) | ORAL | 0 refills | Status: DC

## 2023-12-23 ENCOUNTER — Emergency Department (HOSPITAL_COMMUNITY)
Admission: EM | Admit: 2023-12-23 | Discharge: 2023-12-23 | Disposition: A | Discharge status: Home / Self Care | Payer: MEDICAID | Attending: Emergency Medicine | Admitting: Emergency Medicine

## 2023-12-23 ENCOUNTER — Other Ambulatory Visit: Payer: Self-pay

## 2023-12-23 DIAGNOSIS — Z760 Encounter for issue of repeat prescription: Secondary | ICD-10-CM | POA: Diagnosis not present

## 2023-12-23 DIAGNOSIS — E876 Hypokalemia: Secondary | ICD-10-CM | POA: Insufficient documentation

## 2023-12-23 DIAGNOSIS — Z7951 Long term (current) use of inhaled steroids: Secondary | ICD-10-CM | POA: Diagnosis not present

## 2023-12-23 DIAGNOSIS — B9689 Other specified bacterial agents as the cause of diseases classified elsewhere: Secondary | ICD-10-CM | POA: Diagnosis not present

## 2023-12-23 DIAGNOSIS — R109 Unspecified abdominal pain: Secondary | ICD-10-CM | POA: Diagnosis present

## 2023-12-23 DIAGNOSIS — Z91010 Allergy to peanuts: Secondary | ICD-10-CM | POA: Diagnosis not present

## 2023-12-23 DIAGNOSIS — J45909 Unspecified asthma, uncomplicated: Secondary | ICD-10-CM | POA: Insufficient documentation

## 2023-12-23 DIAGNOSIS — R197 Diarrhea, unspecified: Secondary | ICD-10-CM | POA: Insufficient documentation

## 2023-12-23 DIAGNOSIS — R1032 Left lower quadrant pain: Secondary | ICD-10-CM | POA: Insufficient documentation

## 2023-12-23 DIAGNOSIS — N760 Acute vaginitis: Secondary | ICD-10-CM | POA: Insufficient documentation

## 2023-12-23 LAB — COMPREHENSIVE METABOLIC PANEL WITH GFR
ALT: 25 U/L (ref 0–44)
AST: 35 U/L (ref 15–41)
Albumin: 3.5 g/dL (ref 3.5–5.0)
Alkaline Phosphatase: 62 U/L (ref 38–126)
Anion gap: 10 (ref 5–15)
BUN: 9 mg/dL (ref 6–20)
CO2: 22 mmol/L (ref 22–32)
Calcium: 8.6 mg/dL — ABNORMAL LOW (ref 8.9–10.3)
Chloride: 106 mmol/L (ref 98–111)
Creatinine, Ser: 0.92 mg/dL (ref 0.44–1.00)
GFR, Estimated: >60 mL/min (ref >60)
Glucose, Bld: 105 mg/dL — ABNORMAL HIGH (ref 70–99)
Potassium: 3.2 mmol/L — ABNORMAL LOW (ref 3.5–5.1)
Sodium: 138 mmol/L (ref 135–145)
Total Bilirubin: 1.9 mg/dL — ABNORMAL HIGH (ref 0.0–1.2)
Total Protein: 6.2 g/dL — ABNORMAL LOW (ref 6.5–8.1)

## 2023-12-23 LAB — URINALYSIS, ROUTINE W REFLEX MICROSCOPIC
Bacteria, UA: NONE SEEN
Bilirubin Urine: NEGATIVE
Glucose, UA: NEGATIVE mg/dL
Hgb urine dipstick: NEGATIVE
Ketones, ur: 5 mg/dL — AB
Leukocytes,Ua: NEGATIVE
Nitrite: NEGATIVE
Protein, ur: 30 mg/dL — AB
Specific Gravity, Urine: 1.029 (ref 1.005–1.030)
pH: 5 (ref 5.0–8.0)

## 2023-12-23 LAB — HCG, SERUM, QUALITATIVE: Preg, Serum: NEGATIVE

## 2023-12-23 LAB — CBC
HCT: 38.7 % (ref 36.0–46.0)
Hemoglobin: 13 g/dL (ref 12.0–15.0)
MCH: 32.4 pg (ref 26.0–34.0)
MCHC: 33.6 g/dL (ref 30.0–36.0)
MCV: 96.5 fL (ref 80.0–100.0)
Platelets: 271 K/uL (ref 150–400)
RBC: 4.01 MIL/uL (ref 3.87–5.11)
RDW: 11.9 % (ref 11.5–15.5)
WBC: 3.9 K/uL — ABNORMAL LOW (ref 4.0–10.5)
nRBC: 0 % (ref 0.0–0.2)

## 2023-12-23 LAB — LIPASE, BLOOD: Lipase: 43 U/L (ref 11–51)

## 2023-12-23 MED ORDER — METRONIDAZOLE 0.75 % VA GEL: 1.0000 | Freq: Every day | VAGINAL | 0 refills | Status: AC

## 2023-12-23 MED ORDER — CEFTRIAXONE SODIUM 500 MG IJ SOLR
500.0000 mg | Freq: Once | INTRAMUSCULAR | Status: AC
  Administered 2023-12-23: 500 mg via INTRAMUSCULAR
  Filled 2023-12-23: qty 500

## 2023-12-23 MED ORDER — DOXYCYCLINE HYCLATE 100 MG PO CAPS
100.0000 mg | ORAL_CAPSULE | Freq: Two times a day (BID) | ORAL | 0 refills | Status: AC
Start: 2023-12-23 — End: 2023-12-30

## 2023-12-23 MED ORDER — OXYCODONE-ACETAMINOPHEN 5-325 MG PO TABS
1.0000 | ORAL_TABLET | Freq: Once | ORAL | Status: AC
  Administered 2023-12-23: 1 via ORAL
  Filled 2023-12-23: qty 1

## 2023-12-23 MED ORDER — ONDANSETRON HCL 4 MG/2ML IJ SOLN
4.0000 mg | Freq: Once | INTRAMUSCULAR | Status: AC
  Administered 2023-12-23: 4 mg via INTRAVENOUS
  Filled 2023-12-23: qty 2

## 2023-12-23 MED ORDER — SODIUM CHLORIDE 0.9 % IV BOLUS
1000.0000 mL | Freq: Once | INTRAVENOUS | Status: AC
  Administered 2023-12-23: 1000 mL via INTRAVENOUS

## 2023-12-23 MED ORDER — POTASSIUM CHLORIDE CRYS ER 20 MEQ PO TBCR
20.0000 meq | EXTENDED_RELEASE_TABLET | Freq: Once | ORAL | Status: AC
  Administered 2023-12-23: 20 meq via ORAL
  Filled 2023-12-23: qty 1

## 2023-12-23 MED ORDER — LIDOCAINE HCL (PF) 1 % IJ SOLN
INTRAMUSCULAR | Status: AC
  Administered 2023-12-23: 1 mL
  Filled 2023-12-23: qty 5

## 2023-12-23 MED ORDER — ALBUTEROL SULFATE (2.5 MG/3ML) 0.083% IN NEBU: 2.5000 mg | INHALATION_SOLUTION | Freq: Four times a day (QID) | RESPIRATORY_TRACT | Status: DC | PRN

## 2023-12-23 MED ORDER — FENTANYL CITRATE PF 50 MCG/ML IJ SOSY
50.0000 ug | PREFILLED_SYRINGE | Freq: Once | INTRAMUSCULAR | Status: AC
  Administered 2023-12-23: 50 ug via INTRAVENOUS
  Filled 2023-12-23: qty 1

## 2023-12-23 MED ORDER — ALBUTEROL SULFATE HFA 108 (90 BASE) MCG/ACT IN AERS
2.0000 | INHALATION_SPRAY | Freq: Once | RESPIRATORY_TRACT | Status: AC
  Administered 2023-12-23: 2 via RESPIRATORY_TRACT
  Filled 2023-12-23: qty 6.7

## 2023-12-23 NOTE — Discharge Instructions (Addendum)
 You were given an inhaler during your visit today you were also treated with Rocephin  to help treat any gonorrhea.  You were also given a prescription for doxycycline , this should help treat chlamydia.  Both of these are culprits of PID.  You were also given metronidazole  gel, please follow the instructions.  Follow-up with your primary care physician as needed.
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
## 2023-12-23 NOTE — ED Triage Notes (Addendum)
 Pt. Stated, I went to UC 2 days ago and Im not better. I also have both my eyes are infected. All symptoms started 2 weeks ago.Im also having diarrhea

## 2023-12-25 ENCOUNTER — Encounter: Payer: Self-pay | Admitting: Family Medicine

## 2023-12-25 ENCOUNTER — Ambulatory Visit (INDEPENDENT_AMBULATORY_CARE_PROVIDER_SITE_OTHER): Payer: MEDICAID | Admitting: Family Medicine

## 2023-12-25 VITALS — BP 124/89 | HR 97 | Ht 61.0 in | Wt 122.4 lb

## 2023-12-25 DIAGNOSIS — R1084 Generalized abdominal pain: Secondary | ICD-10-CM

## 2023-12-25 DIAGNOSIS — H109 Unspecified conjunctivitis: Secondary | ICD-10-CM | POA: Diagnosis not present

## 2023-12-25 DIAGNOSIS — Z3042 Encounter for surveillance of injectable contraceptive: Secondary | ICD-10-CM | POA: Diagnosis not present

## 2023-12-25 DIAGNOSIS — F2089 Other schizophrenia: Secondary | ICD-10-CM

## 2023-12-25 DIAGNOSIS — Z59819 Housing instability, housed unspecified: Secondary | ICD-10-CM

## 2023-12-25 DIAGNOSIS — F411 Generalized anxiety disorder: Secondary | ICD-10-CM

## 2023-12-25 MED ORDER — SYSTANE PRESERVATIVE FREE 0.4-0.3 % OP SOLN: OPHTHALMIC | 2 refills | Status: AC

## 2023-12-25 MED ORDER — PROBIOTIC 1-250 BILLION-MG PO CAPS: ORAL_CAPSULE | ORAL | 1 refills | Status: AC

## 2023-12-25 MED ORDER — MEDROXYPROGESTERONE ACETATE 150 MG/ML IM SUSP
150.0000 mg | Freq: Once | INTRAMUSCULAR | Status: AC
  Administered 2023-12-25: 150 mg via INTRAMUSCULAR

## 2023-12-25 MED ORDER — CETIRIZINE HCL 10 MG PO TABS: 10.0000 mg | ORAL_TABLET | Freq: Every day | ORAL | 11 refills | Status: AC

## 2023-12-25 NOTE — Patient Instructions (Addendum)
 Thank you for visiting clinic today and allowing us  to participate in your care!  -For your abdominal pain, please finish taking your antibiotics as prescribed. Try daily a daily probiotic to help with your symptoms. Stay hydrated.   -For your eye redness, try taking Zyrtec  daily and using soothing eye drops. You can use cool compresses as well. Be sure to see [facility] care as planned.   -You received your depo shot today. Please plan to return for your next one in 3 months.   -Come to our office to get your third HPV shot starting in October.   -Call our office to see Dr Koval when you can.   Reach out any time with any questions or concerns you may have - we are here for you!  Darlene Cassis, MD [facility] [phone]   HPV shot due:

## 2023-12-25 NOTE — Progress Notes (Unsigned)
    SUBJECTIVE:   CHIEF COMPLAINT / HPI:   Abdominal pain -Seen in ED recently for abdominal pain, given abx  -Ongoing for a couple weeks -Kept things down today, prior nonbloody vomit  -Continues to have diarrhea    Eyes  -Recently eyes have been itchy, red, painful bilaterally -Has seasonal allergies, also been sneezing and congested -Eyes will drain, crust  -Planning to see [facility]  Mood -Notes continued stressors and depressed mood -No SI/HI  -Has recently connected with [facility] - working on assessment   PERTINENT  PMH / PSH: BPD, Schizophrenia, MDD, PTSD  OBJECTIVE:   BP 124/89   Pulse 97   Ht 5' 1 (1.549 m)   Wt 122 lb 6.4 oz (55.5 kg)   LMP 12/08/2023 (Exact Date)   SpO2 99%   BMI 23.13 kg/m    General: Well-appearing. Resting comfortably in room. Eyes: Injected sclera bilaterally. No visible crusting or drainage. PERRL. EOMI. No surrounding or periorbital swelling or tenderness. CV: Normal S1/S2. No extra heart sounds. Warm and well-perfused. Pulm: Breathing comfortably on room air. CTAB. No increased WOB. Abd: Normoactive BS. Soft, non-distended. Tender throughout. No rebound or guarding.  Skin:  Warm, dry. Psych: Pleasant and appropriate.    ASSESSMENT/PLAN:   Assessment & Plan Conjunctivitis of both eyes, unspecified conjunctivitis type History and exam most consistent with viral conjunctivitis vs allergic conjunctivitis.  -Systane eye drops for comfort -Daily Zyrtec   -Hygiene, cold compresses discussed -Patient planning to see [facility]  Generalized abdominal pain Exam reassuring against acute abdomen. Suspect viral etiology vs side effects from recent antibiotic usage. -Discussed probiotics for support -Discussed importance of hydration Encounter for Depo-Provera  contraception Received Depo shot today.  Housing insecurity [facility] referral placed.  Other schizophrenia (HCC) Generalized anxiety disorder No SI/HI. Encouraged  [facility] follow up as patient has planned for further assessment and management.    RTC as needed.   Damien Cassis, MD [facility]

## 2023-12-26 ENCOUNTER — Telehealth: Payer: Self-pay | Admitting: *Deleted

## 2023-12-26 NOTE — Assessment & Plan Note (Signed)
 No SI/HI. Encouraged [facility] follow up as patient has planned for further assessment and management.

## 2023-12-26 NOTE — Progress Notes (Signed)
 Complex Care Management Note Care Guide Note  12/26/2023 Name: Darlene Gates MRN: [id number] DOB: 09/01/1985   Complex Care Management Outreach Attempts: An unsuccessful telephone outreach was attempted today to offer the patient information about available complex care management services.  Follow Up Plan:  Additional outreach attempts will be made to offer the patient complex care management information and services.   Encounter Outcome:  No Answer  Tailored Plan contact information [facility] Member and Recipient Services: [phone]  [facility] Crisis Line: [phone] [email or web address]   [name]  [facility]  [facility], [facility] Guide  Direct Dial: [phone]  Fax [phone]

## 2023-12-26 NOTE — Assessment & Plan Note (Signed)
 [facility] referral placed.

## 2023-12-29 ENCOUNTER — Other Ambulatory Visit: Payer: MEDICAID

## 2023-12-29 NOTE — Patient Instructions (Signed)
 Visit Information  Thank you for taking time to visit with me today. Please don't hesitate to contact me if I can be of assistance to you before our next scheduled appointment.  Our next appointment is by telephone on 01/02/24 at 2pm  Please call the care guide team at [phone] if you need to cancel or reschedule your appointment.   Following is a copy of your care plan:   Goals Addressed   None     Please call 911 if you are experiencing a Mental Health or Behavioral Health Crisis or need someone to talk to.  Patient verbalizes understanding of instructions and care plan provided today and agrees to view in MyChart. Active MyChart status and patient understanding of how to access instructions and care plan via MyChart confirmed with patient.     Tillman Gardener, BSW [place]  [facility], [facility] Social Worker Direct Dial: [phone]  Fax: [phone] Website: [email or web address]

## 2023-12-29 NOTE — Patient Outreach (Signed)
 SW received Urgent request to contact patient regarding housing and cell phone.  Patient reports she has been homeless for more than 5-10 years.  Patient has no income and is applying for disability.  Patient last worked in August.  Patient is homeless in [place] but keeps her [place] address for mail.  Patient is assigned to [facility] Tailored Care Plan, but she is not living in the service area.  There is concern that patient may have to switch her Medicaid to [employer].  Patient has not been contacted regarding Care Management Services.  SW offered to contact [facility] but patient requests to schedule another day.  SW does suggest patient apply for Safe Link/Assurance free government cell phone but she does not have transportation to get to [employer].  SW also suggests contacting her [employer] case worker to assist with the application for the free government cell phone.  Patient has worked with [facility] and [facility] for ending homelessness in the past.  Patient declined to work with [facility] due to previous issues.  Patient was approved for a voucher with [facility] for ending homelessness about 2-3 years ago, but due to an issue she did not get the voucher.  Patient is interested in contacting [facility] to apply again.  SW will email contact information for [facility] Ending Homelessness and Safe Link application.  Tillman Gardener, BSW [place]  [facility], [facility] Social Worker Direct Dial: [phone]  Fax: [phone] Website: [email or web address]

## 2023-12-29 NOTE — Progress Notes (Signed)
 Complex Care Management Note  Care Guide Note 12/29/2023 Name: Donisha Hoch MRN: [id number] DOB: 1986/01/16  Soma Bachand Uliano is a 38 y.o. year old female who sees Diona Perkins, MD for primary care. I reached out to Harlene Macario [place] by phone today to offer complex care management services.  Ms. Rookstool was given information about Complex Care Management services today including:   The Complex Care Management services include support from the care team which includes your Nurse Care Manager, Clinical Social Worker, or Pharmacist.  The Complex Care Management team is here to help remove barriers to the health concerns and goals most important to you. Complex Care Management services are voluntary, and the patient may decline or stop services at any time by request to their care team member.   Complex Care Management Consent Status: Patient agreed to services and verbal consent obtained.   Follow up plan:  Telephone appointment with complex care management team member scheduled for:  12/29/23 for call with BSW   Encounter Outcome:  Patient Scheduled  Tailored Plan contact information [facility] Member and Recipient Services: [phone]  [facility] Crisis Line: [phone] [email or web address]    [name]  [facility] Health  [facility], [facility] Guide  Direct Dial: [phone]  Fax [phone]

## 2023-12-29 NOTE — Progress Notes (Signed)
 Complex Care Management Note Care Guide Note  12/29/2023 Name: Darlene Gates MRN: [id number] DOB: Apr 25, 1985   Complex Care Management Outreach Attempts: A second unsuccessful outreach was attempted today to offer the patient with information about available complex care management services.  Follow Up Plan:  Additional outreach attempts will be made to offer the patient complex care management information and services.   Encounter Outcome:  No Answer  [name]  [facility]  [facility], [facility] Guide  Direct Dial: [phone]  Fax [phone]

## 2024-01-02 ENCOUNTER — Telehealth: Payer: Self-pay

## 2024-01-05 ENCOUNTER — Telehealth: Payer: Self-pay

## 2024-01-12 ENCOUNTER — Other Ambulatory Visit: Payer: Self-pay

## 2024-01-12 NOTE — Patient Outreach (Signed)
 Patient reports she has not connected with [facility] to enroll for Care Management.  Patient reports she is busy and request to speak another day.  SW does schedule another appointment.  If patient has not contacted [facility] by the next visit, SW will attempt to call.     Darlene Gates, BSW [place]  [facility], [facility] Social Worker Direct Dial: [phone]  Fax: [phone] Website: [email or web address]

## 2024-01-12 NOTE — Patient Instructions (Signed)
   Visit Information  Thank you for taking time to visit with me today. Please don't hesitate to contact me if I can be of assistance to you before our next scheduled appointment.  Your next care management appointment is by telephone on 01/15/24 at 11:30am   Please call the care guide team at [phone] if you need to cancel, schedule, or reschedule an appointment.   Please call 911 if you are experiencing a Mental Health or Behavioral Health Crisis or need someone to talk to.  Tillman Gardener, BSW [place]  [facility], [facility] Social Worker Direct Dial: [phone]  Fax: [phone] Website: [email or web address]

## 2024-01-15 ENCOUNTER — Other Ambulatory Visit: Payer: Self-pay

## 2024-01-15 NOTE — Patient Outreach (Signed)
 Patient has not contacted [facility], [facility] or DSS for cell phone assistance.  Patient has the cell phone resolved and is no longer an issue.  SW t/c [facility] and staff reports patient is assigned to [facility] [phone] for Tailored Plan Care Management.  Staff referred patient to contact Medicaid Direct regarding transferring to [facility].  T/c Medicaid Direct and informed Medicaid has been transferred to [facility] and instructed to contact DSS in [place] to assign to [place].  T/c [facility] DSS and [place] is not showing in [place].  [facility] working is [employer] [phone]. Request to have Medicaid staff in [facility] contact patient to assist.  SW t/c [facility] DSS and spoke to staff who agreed to message Ms. Howard regarding transferring to [employer].  Staff reports it could take 30 days for the transfer but suggested patient call back in 2 weeks to follow up.  Patient has upcoming MH visit with [facility].  SW does encourage patient to connect with [facility] [phone] as they are still currently assigned to assist with support until the transfer is made.  SW to follow up 01/29/24 at 1pm on the outcome.   Tillman Gardener, BSW [place]  [facility], [facility] Social Worker Direct Dial: [phone]  Fax: [phone] Website: [email or web address]

## 2024-01-15 NOTE — Patient Instructions (Signed)
 Visit Information  Thank you for taking time to visit with me today. Please don't hesitate to contact me if I can be of assistance to you before our next scheduled appointment.  Your next care management appointment is by telephone on 01/29/24 at 1pm   Please call the care guide team at [phone] if you need to cancel, schedule, or reschedule an appointment.   Please call 911 if you are experiencing a Mental Health or Behavioral Health Crisis or need someone to talk to.  Tillman Gardener, BSW [place]  [facility], [facility] Social Worker Direct Dial: [phone]  Fax: [phone] Website: [email or web address]

## 2024-01-20 ENCOUNTER — Other Ambulatory Visit: Payer: Self-pay

## 2024-01-20 ENCOUNTER — Emergency Department (HOSPITAL_COMMUNITY)
Admission: EM | Admit: 2024-01-20 | Discharge: 2024-01-20 | Disposition: A | Discharge status: Home / Self Care | Payer: MEDICAID | Attending: Emergency Medicine | Admitting: Emergency Medicine

## 2024-01-20 ENCOUNTER — Encounter (HOSPITAL_COMMUNITY): Payer: Self-pay

## 2024-01-20 DIAGNOSIS — R1084 Generalized abdominal pain: Secondary | ICD-10-CM | POA: Insufficient documentation

## 2024-01-20 DIAGNOSIS — R197 Diarrhea, unspecified: Secondary | ICD-10-CM | POA: Diagnosis not present

## 2024-01-20 DIAGNOSIS — R112 Nausea with vomiting, unspecified: Secondary | ICD-10-CM | POA: Insufficient documentation

## 2024-01-20 DIAGNOSIS — Z91010 Allergy to peanuts: Secondary | ICD-10-CM | POA: Diagnosis not present

## 2024-01-20 LAB — URINALYSIS, ROUTINE W REFLEX MICROSCOPIC
Bilirubin Urine: NEGATIVE
Glucose, UA: NEGATIVE mg/dL
Ketones, ur: NEGATIVE mg/dL
Leukocytes,Ua: NEGATIVE
Nitrite: NEGATIVE
Protein, ur: NEGATIVE mg/dL
Specific Gravity, Urine: 1.009 (ref 1.005–1.030)
pH: 5 (ref 5.0–8.0)

## 2024-01-20 LAB — COMPREHENSIVE METABOLIC PANEL WITH GFR
ALT: 31 U/L (ref 0–44)
AST: 49 U/L — ABNORMAL HIGH (ref 15–41)
Albumin: 3.9 g/dL (ref 3.5–5.0)
Alkaline Phosphatase: 63 U/L (ref 38–126)
Anion gap: 12 (ref 5–15)
BUN: 8 mg/dL (ref 6–20)
CO2: 18 mmol/L — ABNORMAL LOW (ref 22–32)
Calcium: 8.6 mg/dL — ABNORMAL LOW (ref 8.9–10.3)
Chloride: 109 mmol/L (ref 98–111)
Creatinine, Ser: 0.99 mg/dL (ref 0.44–1.00)
GFR, Estimated: >60 mL/min (ref >60)
Glucose, Bld: 83 mg/dL (ref 70–99)
Potassium: 3.9 mmol/L (ref 3.5–5.1)
Sodium: 139 mmol/L (ref 135–145)
Total Bilirubin: 1.3 mg/dL — ABNORMAL HIGH (ref 0.0–1.2)
Total Protein: 6.9 g/dL (ref 6.5–8.1)

## 2024-01-20 LAB — HCG, SERUM, QUALITATIVE: Preg, Serum: NEGATIVE

## 2024-01-20 LAB — CBC WITH DIFFERENTIAL/PLATELET
Abs Immature Granulocytes: 0 K/uL (ref 0.00–0.07)
Basophils Absolute: 0 K/uL (ref 0.0–0.1)
Basophils Relative: 1 %
Eosinophils Absolute: 0.1 K/uL (ref 0.0–0.5)
Eosinophils Relative: 2 %
HCT: 39.2 % (ref 36.0–46.0)
Hemoglobin: 13.6 g/dL (ref 12.0–15.0)
Immature Granulocytes: 0 %
Lymphocytes Relative: 64 %
Lymphs Abs: 3.4 K/uL (ref 0.7–4.0)
MCH: 33.3 pg (ref 26.0–34.0)
MCHC: 34.7 g/dL (ref 30.0–36.0)
MCV: 95.8 fL (ref 80.0–100.0)
Monocytes Absolute: 0.3 K/uL (ref 0.1–1.0)
Monocytes Relative: 7 %
Neutro Abs: 1.4 K/uL — ABNORMAL LOW (ref 1.7–7.7)
Neutrophils Relative %: 26 %
Platelets: 373 K/uL (ref 150–400)
RBC: 4.09 MIL/uL (ref 3.87–5.11)
RDW: 13 % (ref 11.5–15.5)
WBC: 5.2 K/uL (ref 4.0–10.5)
nRBC: 0 % (ref 0.0–0.2)

## 2024-01-20 LAB — LIPASE, BLOOD: Lipase: 40 U/L (ref 11–51)

## 2024-01-20 MED ORDER — ONDANSETRON 4 MG PO TBDP: ORAL_TABLET | ORAL | 0 refills | Status: AC

## 2024-01-20 MED ORDER — ONDANSETRON 4 MG PO TBDP
4.0000 mg | ORAL_TABLET | Freq: Once | ORAL | Status: AC
  Administered 2024-01-20: 4 mg via ORAL
  Filled 2024-01-20: qty 1

## 2024-01-20 NOTE — ED Triage Notes (Signed)
 Pt coming in from jail where she requested to be seen for her pregnancy . Pt reports being [redacted] weeks pregnant. Pt is reporting now that she has malaise, body aches chills diarrhea. Pt is agitated at triage. Pt reports having three or 4 drinks    Ems vitals  130 palp  96 pulse 18rr 97% ra  90 cbg

## 2024-01-20 NOTE — Discharge Instructions (Addendum)
 You can take Imodium  for diarrhea.  Please follow-up with your family doctor in the office.

## 2024-01-20 NOTE — ED Provider Notes (Signed)
 [place] EMERGENCY DEPARTMENT AT [facility] Provider Note   CSN: [id number] Arrival date & time: 01/20/24  1914     Patient presents with: Nausea, Emesis, and Generalized Body Aches   Darlene Gates is a 38 y.o. female.   38 yo F with a chief complaints of nausea vomiting and diarrhea.  Reportedly going on for a couple weeks.  Diffuse abdominal discomfort.  She was brought in by police for what sounds like abnormal behavior and intoxication.  She told them that she was pregnant and the need to have it evaluated and so she was brought here for evaluation.  She denies vaginal bleeding or discharge.   Emesis      Prior to Admission medications   Medication Sig Start Date End Date Taking? Authorizing Provider  ondansetron  (ZOFRAN -ODT) 4 MG disintegrating tablet 4mg  ODT q4 hours prn nausea/vomit 01/20/24  Yes Zaleah Ternes, DO  acetaminophen  (TYLENOL ) 500 MG tablet Take 1,000 mg by mouth every 6 (six) hours as needed.    [provider]  albuterol  (PROVENTIL ) (2.5 MG/3ML) 0.083% nebulizer solution Take 3 mLs (2.5 mg total) by nebulization every 6 (six) hours as needed for wheezing or shortness of breath. Patient not taking: Reported on 11/12/2023 10/09/23   Diona Perkins, MD  albuterol  (VENTOLIN  HFA) 108 [phone] Base) MCG/ACT inhaler Inhale 1-2 puffs into the lungs every 6 (six) hours as needed for wheezing or shortness of breath. 10/09/23   Diona Perkins, MD  Bacillus Coagulans-Inulin (PROBIOTIC) 1-250 BILLION-MG CAPS Take 1 by mouth daily. 12/25/23   Diona Perkins, MD  budesonide -formoterol  (SYMBICORT ) 80-4.5 MCG/ACT inhaler Inhale 2 puffs into the lungs 2 (two) times daily. Patient taking differently: Inhale 2 puffs into the lungs 2 (two) times daily. Admits to forgetting to take at times 10/09/23   McDiarmid, Krystal BIRCH, MD  cetirizine  (ZYRTEC ) 10 MG tablet Take 1 tablet (10 mg total) by mouth daily. 12/25/23   Diona Perkins, MD  EPINEPHrine  0.3 mg/0.3 mL IJ SOAJ injection Inject  0.3 mg into the muscle as needed for anaphylaxis. 10/09/23   Diona Perkins, MD  fluticasone  (FLONASE ) 50 MCG/ACT nasal spray Place 2 sprays into both nostrils 2 (two) times daily as needed for allergies or rhinitis. 10/09/23   McDiarmid, Krystal BIRCH, MD  hydrOXYzine  (ATARAX ) 10 MG tablet Take 1 tablet (10 mg total) by mouth 3 (three) times daily as needed. 11/12/23   Diona Perkins, MD  ibuprofen  (ADVIL ) 600 MG tablet Take 1 tablet (600 mg total) by mouth every 8 (eight) hours as needed (pain). 12/13/23   Vonna Sharlet POUR, MD  naproxen  (NAPROSYN ) 500 MG tablet Take 1 tablet (500 mg total) by mouth 2 (two) times daily. 10/13/23   Cleotilde Lukes, DO  Polyethyl Glyc-Propyl Glyc PF (SYSTANE PRESERVATIVE FREE) 0.4-0.3 % SOLN Place 1-2 drops in eyes as needed for comfort. 12/25/23   Diona Perkins, MD  varenicline  (CHANTIX ) 0.5 MG tablet Take 1 tablet (0.5 mg total) by mouth 2 (two) times daily with a meal. As directed 10/09/23   McDiarmid, Krystal BIRCH, MD    Allergies: Peanut (diagnostic), Phenergan [promethazine], Pineapple, Thorazine [chlorpromazine], and [place] venom    Review of Systems  Gastrointestinal:  Positive for vomiting.    Updated Vital Signs BP (!) 141/105 (BP Location: Left Arm)   Pulse 98   Temp 98.2 F (36.8 C) (Oral)   Resp 18   Ht 5' 1 (1.549 m)   Wt 55 kg   LMP  (LMP Unknown)   SpO2  100%   BMI 22.91 kg/m   Physical Exam Vitals and nursing note reviewed.  Constitutional:      General: She is not in acute distress.    Appearance: She is well-developed. She is not diaphoretic.  HENT:     Head: Normocephalic and atraumatic.  Eyes:     Pupils: Pupils are equal, round, and reactive to light.  Cardiovascular:     Rate and Rhythm: Normal rate and regular rhythm.     Heart sounds: No murmur heard.    No friction rub. No gallop.  Pulmonary:     Effort: Pulmonary effort is normal.     Breath sounds: No wheezing or rales.  Abdominal:     General: There is no distension.     Palpations:  Abdomen is soft.     Tenderness: There is no abdominal tenderness.  Musculoskeletal:        General: No tenderness.     Cervical back: Normal range of motion and neck supple.  Skin:    General: Skin is warm and dry.  Neurological:     Mental Status: She is alert and oriented to person, place, and time.  Psychiatric:        Behavior: Behavior normal.     (all labs ordered are listed, but only abnormal results are displayed) Labs Reviewed  CBC WITH DIFFERENTIAL/PLATELET - Abnormal; Notable for the following components:      Result Value   Neutro Abs 1.4 (*)    All other components within normal limits  COMPREHENSIVE METABOLIC PANEL WITH GFR - Abnormal; Notable for the following components:   CO2 18 (*)    Calcium 8.6 (*)    AST 49 (*)    Total Bilirubin 1.3 (*)    All other components within normal limits  URINALYSIS, ROUTINE W REFLEX MICROSCOPIC - Abnormal; Notable for the following components:   Hgb urine dipstick MODERATE (*)    Bacteria, UA RARE (*)    All other components within normal limits  LIPASE, BLOOD  HCG, SERUM, QUALITATIVE    EKG: None  Radiology: No results found.   Procedures   Medications Ordered in the ED  ondansetron  (ZOFRAN -ODT) disintegrating tablet 4 mg (4 mg Oral Given 01/20/24 1946)                                    Medical Decision Making Amount and/or Complexity of Data Reviewed Labs: ordered.  Risk Prescription drug management.   38 yo F with a chief complaint of nausea vomiting and diarrhea.  Going on for couple weeks.  Benign abdominal exam for me.  Blood work without significant electrolyte abnormalities pregnancy test negative UA negative for infection.  Will discharge to jail.  9:08 PM:  I have discussed the diagnosis/risks/treatment options with the patient.  Evaluation and diagnostic testing in the emergency department does not suggest an emergent condition requiring admission or immediate intervention beyond what has been  performed at this time.  They will follow up with PCP. We also discussed returning to the ED immediately if new or worsening sx occur. We discussed the sx which are most concerning (e.g., sudden worsening pain, fever, inability to tolerate by mouth) that necessitate immediate return. Medications administered to the patient during their visit and any new prescriptions provided to the patient are listed below.  Medications given during this visit Medications  ondansetron  (ZOFRAN -ODT) disintegrating tablet 4 mg (4  mg Oral Given 01/20/24 1946)     The patient appears reasonably screen and/or stabilized for discharge and I doubt any other medical condition or other [facility] requiring further screening, evaluation, or treatment in the ED at this time prior to discharge.       Final diagnoses:  Nausea vomiting and diarrhea    ED Discharge Orders          Ordered    ondansetron  (ZOFRAN -ODT) 4 MG disintegrating tablet        01/20/24 2106               Emil Share, DO 01/20/24 2108

## 2024-01-20 NOTE — ED Provider Triage Note (Signed)
 Emergency Medicine Provider Triage Evaluation Note  Darlene Gates , a 38 y.o. female  was evaluated in triage.  Pt complains of abdominal pain nausea vomiting and diarrhea.  Reportedly going on for a couple weeks.  He was brought in by police in handcuffs.  Reportedly intoxicated..  Review of Systems  Positive: Ap, n/v/d Negative: Denies international travel sick contacts  Physical Exam  BP (!) 141/105 (BP Location: Left Arm)   Pulse 98   Temp 98.2 F (36.8 C) (Oral)   Resp 18   Ht 5' 1 (1.549 m)   Wt 55 kg   LMP  (LMP Unknown)   SpO2 100%   BMI 22.91 kg/m  Gen:   Awake, no distress   Resp:  Normal effort  MSK:   Moves extremities without difficulty  Other:  No obvious focal abdominal discomfort on exam  Medical Decision Making  Medically screening exam initiated at 7:33 PM.  Appropriate orders placed.  Darlene Gates was informed that the remainder of the evaluation will be completed by another provider, this initial triage assessment does not replace that evaluation, and the importance of remaining in the ED until their evaluation is complete.     Emil Share, DO 01/20/24 [phone]

## 2024-01-20 NOTE — ED Triage Notes (Signed)
 Pt reports last drink at 5pm. Pt reports daily drinking.  Pt is currently in police custody.

## 2024-01-29 ENCOUNTER — Telehealth: Payer: MEDICAID

## 2024-01-29 NOTE — Patient Instructions (Signed)
 Harlene Macario [place] - I am sorry I was unable to reach you today for our scheduled appointment. I work with Diona Perkins, MD and am calling to support your healthcare needs. Please contact me at [phone] at your earliest convenience. I look forward to speaking with you soon.   Thank you,  Tillman Gardener, BSW [place]  [facility], [facility] Social Worker Direct Dial: [phone]  Fax: [phone] Website: [email or web address]

## 2024-02-04 ENCOUNTER — Telehealth: Payer: MEDICAID

## 2024-02-04 NOTE — Patient Instructions (Signed)
 Darlene Gates - I am sorry I was unable to reach you today for our scheduled appointment. I work with Diona Perkins, MD and am calling to support your healthcare needs. Please contact me at [phone] at your earliest convenience. I look forward to speaking with you soon.   Thank you,  Tillman Gardener, BSW [place]  [facility], [facility] Social Worker Direct Dial: [phone]  Fax: [phone] Website: [email or web address]

## 2024-02-09 ENCOUNTER — Telehealth: Payer: MEDICAID

## 2024-02-09 ENCOUNTER — Telehealth: Payer: Self-pay

## 2024-02-09 NOTE — Patient Instructions (Signed)
 Harlene Helling [place] - I have attempted to call you three times but have been unsuccessful in reaching you. I work with Diona Perkins, MD and am calling to support your healthcare needs. If I can be of assistance to you, please contact me at [phone].     Thank you,  Tillman Gardener, BSW [place]  [facility] Social Worker Direct Dial: [phone]  Fax: [phone] Website: [email or web address]

## 2024-03-24 ENCOUNTER — Other Ambulatory Visit: Payer: Self-pay | Admitting: Family Medicine

## 2024-04-06 ENCOUNTER — Ambulatory Visit: Payer: MEDICAID | Admitting: Pharmacist

## 2024-04-06 ENCOUNTER — Encounter: Payer: Self-pay | Admitting: Family Medicine

## 2024-04-06 ENCOUNTER — Ambulatory Visit (INDEPENDENT_AMBULATORY_CARE_PROVIDER_SITE_OTHER): Payer: MEDICAID | Admitting: Family Medicine

## 2024-04-06 ENCOUNTER — Other Ambulatory Visit (HOSPITAL_COMMUNITY)
Admission: RE | Admit: 2024-04-06 | Discharge: 2024-04-06 | Disposition: A | Payer: MEDICAID | Source: Ambulatory Visit | Attending: Family Medicine | Admitting: Family Medicine

## 2024-04-06 ENCOUNTER — Encounter: Payer: Self-pay | Admitting: Pharmacist

## 2024-04-06 VITALS — BP 118/73 | HR 73 | Ht 61.0 in | Wt 133.0 lb

## 2024-04-06 VITALS — BP 118/73 | HR 73 | Wt 133.0 lb

## 2024-04-06 DIAGNOSIS — N912 Amenorrhea, unspecified: Secondary | ICD-10-CM | POA: Diagnosis not present

## 2024-04-06 DIAGNOSIS — Z124 Encounter for screening for malignant neoplasm of cervix: Secondary | ICD-10-CM

## 2024-04-06 DIAGNOSIS — Z113 Encounter for screening for infections with a predominantly sexual mode of transmission: Secondary | ICD-10-CM

## 2024-04-06 DIAGNOSIS — Z3200 Encounter for pregnancy test, result unknown: Secondary | ICD-10-CM | POA: Diagnosis not present

## 2024-04-06 DIAGNOSIS — Z720 Tobacco use: Secondary | ICD-10-CM

## 2024-04-06 LAB — POCT URINE PREGNANCY: Preg Test, Ur: NEGATIVE

## 2024-04-06 MED ORDER — PRENATAL VITAMIN 27-0.8 MG PO TABS: 1.0000 | ORAL_TABLET | Freq: Every day | ORAL | 3 refills | Status: AC

## 2024-04-06 MED ORDER — VARENICLINE TARTRATE 0.5 MG PO TABS: 0.5000 mg | ORAL_TABLET | Freq: Two times a day (BID) | ORAL | 6 refills | Status: AC

## 2024-04-06 NOTE — Patient Instructions (Addendum)
 Thank you for visiting clinic today and allowing us  to participate in your care!  Your pregnancy test today was negative. Please take a prenatal vitamin daily. Let us  know if you become interested in birth control.   We collected a pap smear today with routine testing. We'll keep you updated with the results.   Reach out any time with any questions or concerns you may have - we are here for you!  Damien Cassis, MD [facility] [phone]

## 2024-04-06 NOTE — Assessment & Plan Note (Signed)
 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      - Restart varenicline  0.5 mg by mouth once daily for 1 week then twice daily with food Patient counseled on purpose, proper use, and potential adverse effects, including GI upset. Goal to decrease smoking to less than 1 ppd by next follow-up with use of varenicline .  -Encouraged effort to reduce intake during this stressful time and encouraged follow-up with me in 1 month.

## 2024-04-06 NOTE — Progress Notes (Signed)
" ° °  S:   Chief Complaint  Patient presents with   Medication Management    Tobacco Use   39 y.o. female who presents for evaluation/assistance with tobacco dependence. Patient is in good spirits.  She is also being seen today by Primary Care Provider, Dr. Diona. At last visit, discussed taking trying to get less than 2 ppd.    PMH is significant for Polysubstance Use Disorder, Asthma Continues to smoke 1-1.5 ppd, (cigarettes/day 20-30 cigarettes).      Reports excitement to restart of her job with [employer] - next week.    Motivation to quit: breathing, save money  O: Clinical ASCVD: No    Review of Systems  Psychiatric/Behavioral:  The patient is nervous/anxious.   All other systems reviewed and are negative.   Physical Exam Vitals reviewed.  Constitutional:      Appearance: Normal appearance.  Pulmonary:     Effort: Pulmonary effort is normal.  Neurological:     Mental Status: She is alert.  Psychiatric:        Mood and Affect: Mood normal.        Thought Content: Thought content normal.    A/P: Tobacco use disorder with moderate nicotine  dependence of 30years duration in a patient who is fair candidate for success at this time, as she has decreased intake from previous and is willing to continue work on reducing intake of nicotine .      - Restart varenicline  0.5 mg by mouth once daily for 1 week then twice daily with food Patient counseled on purpose, proper use, and potential adverse effects, including GI upset. Goal to decrease smoking to less than 1 ppd by next follow-up with use of varenicline .  -Encouraged effort to reduce intake during this stressful time and encouraged follow-up with me in 1 month.     Written patient instructions provided. Patient verbalized understanding of treatment plan.  Total time in face to face counseling 22 minutes.    Follow-up:  Pharmacist 1 month PCP clinic visit TBD Patient seen with Sabra Schuller, PharmD Candidate - PY2  student and Megan McGill, PharmD Candidate - PY4 student.    "

## 2024-04-06 NOTE — Progress Notes (Signed)
" ° ° °  SUBJECTIVE:   CHIEF COMPLAINT / HPI:   Pregnancy concern LMP: 01/20/24 Reports previously regular cycles Not using any form of contraception Desires pregnancy, not interested in contraception at this time  Sometimes takes PNV No recent vaginal discharge changes Interested in routine STI/STD testing    OBJECTIVE:   BP 118/73   Pulse 73   Ht 5' 1 (1.549 m)   Wt 133 lb (60.3 kg)   LMP 01/20/2024   BMI 25.13 kg/m   General: Well-appearing. Resting comfortably in room. CV: Normal S1/S2. No extra heart sounds. Warm and well-perfused. Pulm: Breathing comfortably on room air. CTAB. No increased WOB. Skin:  Warm, dry. Psych: Pleasant and appropriate.  GU: Normal appearing external genitalia. Normal appearing cervix and vaginal rugae. Exam chaperoned by clinical staff.   ASSESSMENT/PLAN:   Assessment & Plan Possible pregnancy Urine pregnancy test negative in clinic today. Patient interested in pregnancy at this time, not interested in contraception. Patient interested in quitting smoking and is working with clinical pharmacy team. Discussed daily PNV.  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 Routine pap smear co-test collected today. Routine screening for STI (sexually transmitted infection) Routine G/C, Trich collected. HIV, RPR ordered.    RTC in 1-2 months for follow up.   Damien Cassis, MD [facility]  "

## 2024-04-06 NOTE — Patient Instructions (Signed)
 Nice to see you today!    Medication Changes: - START Chantix  (varenicline ) 1 tablet daily with food for 1 week THEN increase to twice daily.  - Continue all other medication the same.   Tobacco Patient Instructions  Quitting smoking is one of the most important decisions you can make for your current and future health. Consider what you dislike about smoking and how quitting could personally benefit you. Try to cut down.   Aim for reducing the amount you smoke to less than 1 pack per day over the next weeks.   Starting today, Be a Quitter!  Remind yourself why you want to quit.  Delay your first cigarette of the day for as long as possible.  Start cleaning out all pockets, drawers, and your car of cigarettes.  Getting Through the Cravings Once You Are Smoke Free: Each craving will last about 10 minutes, whether or not you smoke. Here's how to get through the cravings without cigarettes:  DELAY: Tell yourself that you'll wait for the next craving. Do it every time! DEEP BREATHS: One reason smoking feels good is because you breathe in deeply to inhale. Take four slow, deep breaths and feel the relaxation without the hamful effects of cigarettes. DRINK WATER: Drink a glass of cool water. It will give your hands and mouth something to do and will help flush the nicotine  out of your system faster. DIVERT: Do something else -- brush your teeth, take a walk, call a friend who can offer you support. Just moving onto something other than thinking about cigarettes will move you through the craving.   Frequently Asked Questions  What can I do when I get the urge to smoke? To get through the urge to smoke, try the following:  Review your reasons for quitting and think of all the benefits to your health, your finances, and your family.  Remind yourself that there is no such thing as just one cigarette -- or even one puff.  Ride out the desire to smoke. Use the 4 Os -- Delay, Deep Breaths,  Drink Water and Divert to get you through. The craving will go away eventually. Do not fool yourself into thinking you can have just one cigarette.  Any tips on how to deal with stress? Stress is a natural part of life. The key is to deal with it without reaching for a cigarette. Taking deep breaths, counting backwards from 10 and asking yourself 1-how big a deal is this?  Writing down your feelings, talking with a friend and doing things like positive self-talk and meditation are some other ways that people deal with daily stress.  What if I start smoking again? Slips happen. Most people try to quit smoking a few times before they are successful. Don't beat yourself up if this happens to you! Ask yourself if this was a slip or a relapse. A slip is a one-time mistake that is quickly corrected. A relapse is going back to your old smoking habits.   If you slip, don't give up. Think of it as a learning experience. Ask yourself what went wrong and renew your commitment to staying away from smoking for good.  If you relapse, try not to get discouraged. Ask yourself the question What caused me to start smoking? Figure out what helped you and what didn't when you tried to quit. Knowing why you relapsed is useful information for your next attempt to quit.  Please bring all medications to your clinic visits.  Please arrive 10-15 minutes prior to your scheduled visit time.

## 2024-04-07 LAB — HIV ANTIBODY (ROUTINE TESTING W REFLEX): HIV Screen 4th Generation wRfx: NONREACTIVE

## 2024-04-07 LAB — SYPHILIS: RPR W/REFLEX TO RPR TITER AND TREPONEMAL ANTIBODIES, TRADITIONAL SCREENING AND DIAGNOSIS ALGORITHM: RPR Ser Ql: NONREACTIVE

## 2024-04-07 NOTE — Progress Notes (Signed)
 Reviewed and agree with Dr Rennis plan.

## 2024-04-08 ENCOUNTER — Ambulatory Visit: Payer: Self-pay | Admitting: Family Medicine

## 2024-04-09 ENCOUNTER — Other Ambulatory Visit (HOSPITAL_COMMUNITY): Payer: Self-pay

## 2024-04-09 ENCOUNTER — Encounter: Payer: Self-pay | Admitting: Family Medicine

## 2024-04-09 ENCOUNTER — Other Ambulatory Visit: Payer: Self-pay | Admitting: Family Medicine

## 2024-04-09 DIAGNOSIS — Z803 Family history of malignant neoplasm of breast: Secondary | ICD-10-CM

## 2024-04-09 NOTE — Progress Notes (Signed)
 Replaced expired mammogram order. Patient with immediate family member with breast cancer at early age.

## 2024-04-15 ENCOUNTER — Other Ambulatory Visit (HOSPITAL_COMMUNITY)
Admission: RE | Admit: 2024-04-15 | Discharge: 2024-04-15 | Disposition: A | Payer: MEDICAID | Source: Ambulatory Visit | Attending: Family Medicine | Admitting: Family Medicine

## 2024-04-15 ENCOUNTER — Ambulatory Visit: Payer: MEDICAID | Admitting: Family Medicine

## 2024-04-15 VITALS — BP 118/76 | Ht 61.0 in | Wt 140.0 lb

## 2024-04-15 DIAGNOSIS — R87619 Unspecified abnormal cytological findings in specimens from cervix uteri: Secondary | ICD-10-CM

## 2024-04-15 LAB — POCT URINE PREGNANCY: Preg Test, Ur: NEGATIVE

## 2024-04-17 NOTE — Progress Notes (Signed)
 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  Placed in lithotomy position. Cervix viewed with speculum and colposcope after application of acetic acid.   Colposcopy adequate (entire squamocolumnar junctions seen  in entirety) ?  yes Acetowhite lesions?none Punctation?no Mosaicism?  no Abnormal vasculature?  no Biopsies?none ECC?no Complications? no Sample for subtyping HPV 16/18/45 was taken and results pending.  COMMENTS: Patient was given post procedure instructions.  I will notify her of any pathology results  - Discussed natural course of HPV infection - During follow up monitoring, any repeat pap smear abnormality would likely require another colposcopy  -Discussed option for referral to GYN for further evaluation / second opinion at any point

## 2024-04-19 LAB — CYTOLOGY - PAP
Adequacy: ABSENT
Diagnosis: NEGATIVE
Diagnosis: REACTIVE

## 2024-04-24 ENCOUNTER — Ambulatory Visit: Payer: Self-pay | Admitting: Family Medicine

## 2024-04-27 ENCOUNTER — Telehealth: Payer: Self-pay | Admitting: Pharmacist

## 2024-04-27 NOTE — Telephone Encounter (Signed)
 Attempted to contact patient for follow-up of need to reschedule.  Unable to contact.  Please try again later  Total time with patient call and documentation of interaction: 4 minutes.  Follow-up phone call planned: 2-3 days

## 2024-04-30 ENCOUNTER — Telehealth: Payer: Self-pay | Admitting: Pharmacist

## 2024-04-30 NOTE — Telephone Encounter (Signed)
 Attempted to contact patient for follow-up of request to reschedule due to provider schedule conflict.  Unable to leave message.  Total time with patient call and documentation of interaction: 3 minutes.  Follow-up phone call planned: 05/03/2024

## 2024-05-04 ENCOUNTER — Ambulatory Visit: Payer: MEDICAID | Admitting: Pharmacist

## 2024-05-07 LAB — CYTOLOGY - PAP
Adequacy: ABSENT
Chlamydia: NEGATIVE
Comment: NEGATIVE
Comment: NEGATIVE
Comment: NEGATIVE
Comment: NEGATIVE
Comment: NEGATIVE
Comment: NORMAL
Diagnosis: NEGATIVE
HPV 16: NEGATIVE
HPV 18 / 45: NEGATIVE
High risk HPV: POSITIVE — AB
Neisseria Gonorrhea: NEGATIVE
Trichomonas: NEGATIVE
